# Patient Record
Sex: Male | Born: 1955 | Race: White | Hispanic: No | Marital: Married | Smoking: Former smoker
Health system: Southern US, Community
[De-identification: ages and names within clinical notes are randomized; demographics above are authoritative.]

## PROBLEM LIST (undated history)

## (undated) DIAGNOSIS — L409 Psoriasis, unspecified: Secondary | ICD-10-CM

## (undated) DIAGNOSIS — C801 Malignant (primary) neoplasm, unspecified: Secondary | ICD-10-CM

## (undated) DIAGNOSIS — N189 Chronic kidney disease, unspecified: Secondary | ICD-10-CM

## (undated) DIAGNOSIS — C649 Malignant neoplasm of unspecified kidney, except renal pelvis: Secondary | ICD-10-CM

## (undated) DIAGNOSIS — T8859XA Other complications of anesthesia, initial encounter: Secondary | ICD-10-CM

## (undated) DIAGNOSIS — E119 Type 2 diabetes mellitus without complications: Secondary | ICD-10-CM

## (undated) DIAGNOSIS — I10 Essential (primary) hypertension: Secondary | ICD-10-CM

## (undated) DIAGNOSIS — G4730 Sleep apnea, unspecified: Secondary | ICD-10-CM

## (undated) DIAGNOSIS — R21 Rash and other nonspecific skin eruption: Secondary | ICD-10-CM

## (undated) DIAGNOSIS — M1A9XX0 Chronic gout, unspecified, without tophus (tophi): Secondary | ICD-10-CM

## (undated) DIAGNOSIS — L719 Rosacea, unspecified: Secondary | ICD-10-CM

## (undated) DIAGNOSIS — M1990 Unspecified osteoarthritis, unspecified site: Secondary | ICD-10-CM

## (undated) DIAGNOSIS — E785 Hyperlipidemia, unspecified: Secondary | ICD-10-CM

## (undated) DIAGNOSIS — Z8739 Personal history of other diseases of the musculoskeletal system and connective tissue: Secondary | ICD-10-CM

## (undated) DIAGNOSIS — H539 Unspecified visual disturbance: Secondary | ICD-10-CM

## (undated) HISTORY — DX: Unspecified visual disturbance: H53.9

## (undated) HISTORY — PX: LITHOTRIPSY: SUR834

## (undated) HISTORY — DX: Essential (primary) hypertension: I10

## (undated) HISTORY — DX: Type 2 diabetes mellitus without complications: E11.9

## (undated) HISTORY — DX: Unspecified osteoarthritis, unspecified site: M19.90

## (undated) HISTORY — DX: Sleep apnea, unspecified: G47.30

## (undated) HISTORY — DX: Other complications of anesthesia, initial encounter: T88.59XA

## (undated) HISTORY — DX: Rosacea, unspecified: L71.9

## (undated) HISTORY — DX: Hyperlipidemia, unspecified: E78.5

## (undated) HISTORY — DX: Chronic gout, unspecified, without tophus (tophi): M1A.9XX0

## (undated) HISTORY — DX: Personal history of other diseases of the musculoskeletal system and connective tissue: Z87.39

## (undated) HISTORY — PX: ACHILLES TENDON SURGERY: SHX542

## (undated) HISTORY — DX: Rash and other nonspecific skin eruption: R21

## (undated) HISTORY — DX: Malignant neoplasm of unspecified kidney, except renal pelvis: C64.9

## (undated) HISTORY — PX: REPLACEMENT TOTAL KNEE BILATERAL: SUR1225

## (undated) HISTORY — DX: Chronic kidney disease, unspecified: N18.9

## (undated) HISTORY — DX: Psoriasis, unspecified: L40.9

## (undated) HISTORY — DX: Malignant (primary) neoplasm, unspecified: C80.1

---

## 1991-06-08 HISTORY — PX: OTHER SURGICAL HISTORY: SHX169

## 1996-12-14 ENCOUNTER — Emergency Department: Admit: 1996-12-14 | Payer: Self-pay | Admitting: Internal Medicine

## 2005-03-09 DIAGNOSIS — M7660 Achilles tendinitis, unspecified leg: Secondary | ICD-10-CM

## 2005-03-09 HISTORY — DX: Achilles tendinitis, unspecified leg: M76.60

## 2007-11-08 ENCOUNTER — Emergency Department: Admit: 2007-11-08 | Payer: Self-pay | Source: Emergency Department | Admitting: Emergency Medicine

## 2007-11-08 LAB — BASIC METABOLIC PANEL
BUN: 13 MG/DL (ref 7–21)
CO2: 29 MEQ/L (ref 22–31)
Calcium: 9.2 MG/DL (ref 8.6–10.2)
Chloride: 105 MEQ/L (ref 98–107)
Creatinine: 0.8 mg/dL (ref 0.6–1.5)
Glucose: 192 MG/DL — ABNORMAL HIGH (ref 65–110)
Potassium: 3.9 MEQ/L (ref 3.6–5.0)
Sodium: 141 MEQ/L (ref 136–143)

## 2007-11-08 LAB — CBC AND DIFFERENTIAL
Basophils Absolute: 0.1 /mm3 (ref 0.0–0.2)
Basophils: 1 % (ref 0–2)
Eosinophils Absolute: 0.2 /mm3 (ref 0.0–0.7)
Eosinophils: 2 % (ref 0–5)
Granulocytes Absolute: 5.5 /mm3 (ref 1.8–8.1)
Hematocrit: 42.6 % (ref 42.0–52.0)
Hgb: 14.9 G/DL (ref 13.0–17.0)
Lymphocytes Absolute: 1.5 /mm3 (ref 0.5–4.4)
Lymphocytes: 20 % (ref 15–41)
MCH: 28.5 PG (ref 28.0–32.0)
MCHC: 35 G/DL (ref 32.0–36.0)
MCV: 81.6 FL (ref 80.0–100.0)
MPV: 9.4 FL (ref 9.4–12.3)
Monocytes Absolute: 0.4 /mm3 (ref 0.0–1.2)
Monocytes: 5 % (ref 0–11)
Neutrophils %: 72 % (ref 52–75)
Platelets: 185 /mm3 (ref 140–400)
RBC: 5.22 /mm3 (ref 4.70–6.00)
RDW: 14.2 % (ref 11.5–15.0)
WBC: 7.66 /mm3 (ref 3.50–10.80)

## 2008-01-08 HISTORY — PX: KNEE ARTHROSCOPY: SUR90

## 2008-01-24 ENCOUNTER — Ambulatory Visit
Admit: 2008-01-24 | Disposition: A | Discharge status: Home / Self Care | Payer: Self-pay | Source: Ambulatory Visit | Admitting: Orthopaedic Surgery

## 2008-01-24 LAB — DIFFERENTIAL AUTOMATED
Basophils Absolute: 0.1 /mm3 (ref 0.0–0.2)
Basophils: 1 % (ref 0–2)
Eosinophils Absolute: 0.2 /mm3 (ref 0.0–0.7)
Eosinophils: 3 % (ref 0–5)
Granulocytes Absolute: 4.5 /mm3 (ref 1.8–8.1)
Lymphocytes Absolute: 1.6 /mm3 (ref 0.5–4.4)
Lymphocytes: 24 % (ref 15–41)
Monocytes Absolute: 0.4 /mm3 (ref 0.0–1.2)
Monocytes: 5 % (ref 0–11)
Neutrophils %: 67 % (ref 52–75)

## 2008-01-24 LAB — CBC
Hematocrit: 42.9 % (ref 42.0–52.0)
Hgb: 14.9 G/DL (ref 13.0–17.0)
MCH: 28.8 PG (ref 28.0–32.0)
MCHC: 34.7 G/DL (ref 32.0–36.0)
MCV: 83 FL (ref 80.0–100.0)
MPV: 10.4 FL (ref 9.4–12.3)
Platelets: 179 /mm3 (ref 140–400)
RBC: 5.17 /mm3 (ref 4.70–6.00)
RDW: 14.5 % (ref 11.5–15.0)
WBC: 6.71 /mm3 (ref 3.50–10.80)

## 2008-01-24 LAB — BASIC METABOLIC PANEL
BUN: 19 MG/DL (ref 7–21)
CO2: 30 MEQ/L (ref 22–31)
Calcium: 9.2 MG/DL (ref 8.6–10.2)
Chloride: 105 MEQ/L (ref 98–107)
Creatinine: 1 mg/dL (ref 0.6–1.5)
Glucose: 110 MG/DL (ref 65–110)
Potassium: 4.6 MEQ/L (ref 3.6–5.0)
Sodium: 142 MEQ/L (ref 136–143)

## 2008-02-01 ENCOUNTER — Emergency Department: Admit: 2008-02-01 | Payer: Self-pay | Source: Emergency Department | Admitting: Emergency Medicine

## 2008-02-01 LAB — TROPONIN I QUANTITATIVE LEVEL - IFOH CERNER: Troponin I Quant: 0.02 NG/ML

## 2008-02-01 LAB — CBC AND DIFFERENTIAL
Basophils Absolute: 0 /mm3 (ref 0.0–0.2)
Basophils: 0 % (ref 0–2)
Eosinophils Absolute: 0.1 /mm3 (ref 0.0–0.7)
Eosinophils: 1 % (ref 0–5)
Granulocytes Absolute: 7.7 /mm3 (ref 1.8–8.1)
Hematocrit: 41.8 % — ABNORMAL LOW (ref 42.0–52.0)
Hgb: 13.7 G/DL (ref 13.0–17.0)
Immature Granulocytes Absolute: 0 CUMM (ref 0.0–0.0)
Immature Granulocytes: 0 % (ref 0–1)
Lymphocytes Absolute: 0.8 /mm3 (ref 0.5–4.4)
Lymphocytes: 9 % — ABNORMAL LOW (ref 15–41)
MCH: 27.9 PG — ABNORMAL LOW (ref 28.0–32.0)
MCHC: 32.8 G/DL (ref 32.0–36.0)
MCV: 85.1 FL (ref 80.0–100.0)
MPV: 10 FL (ref 9.4–12.3)
Monocytes Absolute: 0.2 /mm3 (ref 0.0–1.2)
Monocytes: 2 % (ref 0–11)
Neutrophils %: 88 % — ABNORMAL HIGH (ref 52–75)
Platelets: 148 /mm3 (ref 140–400)
RBC: 4.91 /mm3 (ref 4.70–6.00)
RDW: 14.1 % (ref 11.5–15.0)
WBC: 8.82 /mm3 (ref 3.50–10.80)

## 2008-02-01 LAB — BASIC METABOLIC PANEL
BUN: 14 MG/DL (ref 7–21)
CO2: 27 MEQ/L (ref 22–31)
Calcium: 8.9 MG/DL (ref 8.6–10.2)
Chloride: 104 MEQ/L (ref 98–107)
Creatinine: 0.8 MG/DL (ref 0.5–1.4)
Glucose: 132 MG/DL — ABNORMAL HIGH (ref 65–110)
Potassium: 4.5 MEQ/L (ref 3.6–5.0)
Sodium: 140 MEQ/L (ref 136–143)

## 2008-02-01 LAB — CK: Creatine Kinase (CK): 81 U/L (ref 20–297)

## 2008-02-01 LAB — GFR

## 2008-02-13 DIAGNOSIS — I10 Essential (primary) hypertension: Secondary | ICD-10-CM | POA: Insufficient documentation

## 2008-02-13 DIAGNOSIS — R9431 Abnormal electrocardiogram [ECG] [EKG]: Secondary | ICD-10-CM | POA: Insufficient documentation

## 2008-02-14 DIAGNOSIS — I209 Angina pectoris, unspecified: Secondary | ICD-10-CM | POA: Insufficient documentation

## 2010-04-09 DIAGNOSIS — N200 Calculus of kidney: Secondary | ICD-10-CM | POA: Insufficient documentation

## 2010-04-12 ENCOUNTER — Emergency Department: Admit: 2010-04-12 | Payer: Self-pay | Source: Emergency Department | Admitting: Emergency Medicine

## 2010-04-12 LAB — COMPREHENSIVE METABOLIC PANEL
ALT: 37 U/L — ABNORMAL HIGH (ref 4–36)
AST (SGOT): 20 U/L (ref 10–41)
Albumin/Globulin Ratio: 1.3 (ref 1.1–1.8)
Albumin: 3.5 g/dL (ref 3.4–4.9)
Alkaline Phosphatase: 112 U/L (ref 43–112)
BUN: 18 mg/dL (ref 7–21)
Bilirubin, Total: 0.6 mg/dL (ref 0.2–1.0)
CO2: 27 mEq/L (ref 22–31)
Calcium: 8.7 mg/dL (ref 8.6–10.2)
Chloride: 102 mEq/L (ref 98–107)
Creatinine: 0.8 mg/dL (ref 0.5–1.4)
Globulin: 2.6 g/dL (ref 2.0–3.7)
Glucose: 146 mg/dL — ABNORMAL HIGH (ref 70–100)
Potassium: 4 mEq/L (ref 3.6–5.0)
Protein, Total: 6.1 g/dL (ref 6.0–8.0)
Sodium: 139 mEq/L (ref 136–143)

## 2010-04-12 LAB — CBC AND DIFFERENTIAL
Baso(Absolute): 0.04 10*3/uL (ref 0.00–0.20)
Basophils: 1 % (ref 0–2)
Eosinophils Absolute: 0.14 10*3/uL (ref 0.00–0.70)
Eosinophils: 2 % (ref 0–5)
Hematocrit: 38.7 % — ABNORMAL LOW (ref 42.0–52.0)
Hgb: 13.3 g/dL (ref 13.0–17.0)
Lymphocytes Absolute: 1.56 10*3/uL (ref 0.50–4.40)
Lymphocytes: 26 % (ref 15–41)
MCH: 29.1 pg (ref 28.0–32.0)
MCHC: 34.4 g/dL (ref 32.0–36.0)
MCV: 84.7 fL (ref 80.0–100.0)
MPV: 9.9 fL (ref 9.4–12.3)
Monocytes Absolute: 0.35 10*3/uL (ref 0.00–1.20)
Monocytes: 6 % (ref 0–11)
Neutrophils Absolute: 3.98 10*3/uL
Neutrophils: 66 % (ref 52–75)
Platelets: 189 10*3/uL (ref 140–400)
RBC: 4.57 10*6/uL — ABNORMAL LOW (ref 4.70–6.00)
RDW: 14 % (ref 12–15)
WBC: 6.07 10*3/uL (ref 3.50–10.80)

## 2010-04-12 LAB — GFR: EGFR: >60

## 2010-04-13 LAB — SEDIMENTATION RATE: Sed Rate: 16 mm/Hr — ABNORMAL HIGH (ref 0–12)

## 2010-04-30 ENCOUNTER — Ambulatory Visit: Payer: Self-pay

## 2010-04-30 ENCOUNTER — Inpatient Hospital Stay
Admission: RE | Admit: 2010-04-30 | Disposition: A | Discharge status: Home / Self Care | Payer: Self-pay | Source: Ambulatory Visit | Admitting: Urology

## 2010-04-30 ENCOUNTER — Emergency Department: Admit: 2010-04-30 | Payer: Self-pay | Source: Emergency Department | Admitting: Emergency Medicine

## 2010-04-30 LAB — BASIC METABOLIC PANEL
BUN: 20 mg/dL (ref 7–21)
CO2: 24 mEq/L (ref 22–31)
Calcium: 9 mg/dL (ref 8.6–10.2)
Chloride: 103 mEq/L (ref 98–107)
Creatinine: 0.7 mg/dL (ref 0.5–1.4)
Glucose: 173 mg/dL — ABNORMAL HIGH (ref 70–100)
Potassium: 4.6 mEq/L (ref 3.6–5.0)
Sodium: 138 mEq/L (ref 136–143)

## 2010-04-30 LAB — URINALYSIS, REFLEX TO MICROSCOPIC EXAM IF INDICATED
Bilirubin, UA: NEGATIVE
Glucose, UA: NEGATIVE
Ketones UA: NEGATIVE
Leukocyte Esterase, UA: NEGATIVE
Nitrite, UA: NEGATIVE
Protein, UR: 100 — AB
Specific Gravity UA POCT: >=1.03 (ref 1.001–1.035)
Urine pH: 5.5 (ref 5.0–8.0)
Urobilinogen, UA: 0.2 mg/dL

## 2010-04-30 LAB — PT AND APTT
PT INR: 1.1 (ref 0.9–1.1)
PT: 13.2 s (ref 10.8–13.3)
PTT: 24 s (ref 21–32)

## 2010-04-30 LAB — CBC AND DIFFERENTIAL
Basophils Absolute Automated: 0.04 10*3/uL (ref 0.00–0.20)
Basophils Automated: 0 % (ref 0–2)
Eosinophils Absolute Automated: 0.03 10*3/uL (ref 0.00–0.70)
Eosinophils Automated: 0 % (ref 0–5)
Hematocrit: 42.4 % (ref 42.0–52.0)
Hgb: 14.9 g/dL (ref 13.0–17.0)
Lymphocytes Absolute Automated: 0.97 10*3/uL (ref 0.50–4.40)
Lymphocytes Automated: 10 % — ABNORMAL LOW (ref 15–41)
MCH: 28.6 pg (ref 28.0–32.0)
MCHC: 35.1 g/dL (ref 32.0–36.0)
MCV: 81.4 fL (ref 80.0–100.0)
MPV: 9.5 fL (ref 9.4–12.3)
Monocytes Absolute Automated: 0.38 10*3/uL (ref 0.00–1.20)
Monocytes: 4 % (ref 0–11)
Neutrophils Absolute: 8.36 10*3/uL — ABNORMAL HIGH (ref 1.80–8.10)
Neutrophils: 86 % — ABNORMAL HIGH (ref 52–75)
Platelets: 207 10*3/uL (ref 140–400)
RBC: 5.21 10*6/uL (ref 4.70–6.00)
RDW: 14 % (ref 12–15)
WBC: 9.78 10*3/uL (ref 3.50–10.80)

## 2010-04-30 LAB — GFR: EGFR: >60

## 2010-05-01 LAB — BASIC METABOLIC PANEL
Anion Gap: 10 (ref 5.0–15.0)
BUN: 15 mg/dL (ref 9.0–21.0)
CO2: 21 mEq/L — ABNORMAL LOW (ref 22–29)
Calcium: 8.4 mg/dL — ABNORMAL LOW (ref 8.5–10.5)
Chloride: 110 mEq/L — ABNORMAL HIGH (ref 98–107)
Creatinine: 0.8 mg/dL (ref 0.7–1.3)
Glucose: 146 mg/dL — ABNORMAL HIGH (ref 70–100)
Potassium: 3.9 mEq/L (ref 3.5–5.1)
Sodium: 141 mEq/L (ref 136–145)

## 2010-05-01 LAB — CBC AND DIFFERENTIAL
Basophils Absolute Automated: 0.04 10*3/uL (ref 0.00–0.20)
Basophils Automated: 1 % (ref 0–2)
Eosinophils Absolute Automated: 0.15 10*3/uL (ref 0.00–0.70)
Eosinophils Automated: 2 % (ref 0–5)
Hematocrit: 38.8 % — ABNORMAL LOW (ref 42.0–52.0)
Hgb: 12.6 g/dL — ABNORMAL LOW (ref 13.0–17.0)
Immature Granulocytes Absolute: 0.01 10*3/uL
Immature Granulocytes: 0 % (ref 0–1)
Lymphocytes Absolute Automated: 1.31 10*3/uL (ref 0.50–4.40)
Lymphocytes Automated: 21 % (ref 15–41)
MCH: 27.6 pg — ABNORMAL LOW (ref 28.0–32.0)
MCHC: 32.5 g/dL (ref 32.0–36.0)
MCV: 85.1 fL (ref 80.0–100.0)
MPV: 10.1 fL (ref 9.4–12.3)
Monocytes Absolute Automated: 0.36 10*3/uL (ref 0.00–1.20)
Monocytes: 6 % (ref 0–11)
Neutrophils Absolute: 4.44 10*3/uL (ref 1.80–8.10)
Neutrophils: 70 % (ref 52–75)
Nucleated RBC: 0 /100 WBC
Platelets: 135 10*3/uL — ABNORMAL LOW (ref 140–400)
RBC: 4.56 10*6/uL — ABNORMAL LOW (ref 4.70–6.00)
RDW: 14 % (ref 12–15)
WBC: 6.3 10*3/uL (ref 3.50–10.80)

## 2010-05-01 LAB — GFR: EGFR: >60

## 2010-05-01 LAB — HEMOLYSIS INDEX: Hemolysis Index: 16 Index (ref 0–18)

## 2010-05-02 ENCOUNTER — Emergency Department: Admit: 2010-05-02 | Payer: Self-pay | Source: Emergency Department | Admitting: Emergency Medicine

## 2010-05-02 LAB — CBC AND DIFFERENTIAL
Basophils Absolute Automated: 0.03 10*3/uL (ref 0.00–0.20)
Basophils Automated: 0 % (ref 0–2)
Eosinophils Absolute Automated: 0.13 10*3/uL (ref 0.00–0.70)
Eosinophils Automated: 2 % (ref 0–5)
Hematocrit: 39.8 % — ABNORMAL LOW (ref 42.0–52.0)
Hgb: 13.3 g/dL (ref 13.0–17.0)
Immature Granulocytes Absolute: 0.05 10*3/uL
Immature Granulocytes: 1 % (ref 0–1)
Lymphocytes Absolute Automated: 0.83 10*3/uL (ref 0.50–4.40)
Lymphocytes Automated: 10 % — ABNORMAL LOW (ref 15–41)
MCH: 28.2 pg (ref 28.0–32.0)
MCHC: 33.4 g/dL (ref 32.0–36.0)
MCV: 84.5 fL (ref 80.0–100.0)
MPV: 9.8 fL (ref 9.4–12.3)
Monocytes Absolute Automated: 0.53 10*3/uL (ref 0.00–1.20)
Monocytes: 6 % (ref 0–11)
Neutrophils Absolute: 6.94 10*3/uL (ref 1.80–8.10)
Neutrophils: 82 % — ABNORMAL HIGH (ref 52–75)
Nucleated RBC: 0 /100 WBC
Platelets: 151 10*3/uL (ref 140–400)
RBC: 4.71 10*6/uL (ref 4.70–6.00)
RDW: 14 % (ref 12–15)
WBC: 8.46 10*3/uL (ref 3.50–10.80)

## 2010-05-02 LAB — BASIC METABOLIC PANEL
Anion Gap: 11 (ref 5.0–15.0)
BUN: 16 mg/dL (ref 9.0–21.0)
CO2: 21 mEq/L — ABNORMAL LOW (ref 22–29)
Calcium: 8.4 mg/dL — ABNORMAL LOW (ref 8.5–10.5)
Chloride: 107 mEq/L (ref 98–107)
Creatinine: 0.9 mg/dL (ref 0.7–1.3)
Glucose: 166 mg/dL — ABNORMAL HIGH (ref 70–100)
Potassium: 4.1 mEq/L (ref 3.5–5.1)
Sodium: 139 mEq/L (ref 136–145)

## 2010-05-02 LAB — URINALYSIS, REFLEX TO MICROSCOPIC EXAM IF INDICATED
Bilirubin, UA: NEGATIVE
Glucose, UA: NEGATIVE
Ketones UA: NEGATIVE
Nitrite, UA: NEGATIVE
Protein, UR: 30 — AB
RBC, UA: 73 /HPF (ref 0–5)
Specific Gravity UA POCT: 1.015 (ref 1.001–1.035)
Urine pH: 6 (ref 5.0–8.0)
Urobilinogen, UA: NORMAL mg/dL
WBC, UA: 7 /HPF (ref 0–5)

## 2010-05-02 LAB — GFR: EGFR: >60

## 2010-05-02 LAB — HEMOLYSIS INDEX: Hemolysis Index: 8 Index (ref 0–18)

## 2010-05-02 LAB — LAB USE ONLY - HISTORICAL SURGICAL PATHOLOGY

## 2010-05-08 DIAGNOSIS — N200 Calculus of kidney: Secondary | ICD-10-CM

## 2010-05-08 HISTORY — DX: Calculus of kidney: N20.0

## 2010-08-18 ENCOUNTER — Encounter (INDEPENDENT_AMBULATORY_CARE_PROVIDER_SITE_OTHER): Payer: Self-pay | Admitting: Family Medicine

## 2010-08-19 ENCOUNTER — Ambulatory Visit (INDEPENDENT_AMBULATORY_CARE_PROVIDER_SITE_OTHER): Admitting: Family Medicine

## 2010-08-19 ENCOUNTER — Encounter (INDEPENDENT_AMBULATORY_CARE_PROVIDER_SITE_OTHER): Payer: Self-pay | Admitting: Family Medicine

## 2010-08-19 VITALS — BP 155/91 | HR 78 | Temp 98.3°F | Ht 75.0 in | Wt 320.4 lb

## 2010-08-19 DIAGNOSIS — N200 Calculus of kidney: Secondary | ICD-10-CM

## 2010-08-19 DIAGNOSIS — I10 Essential (primary) hypertension: Secondary | ICD-10-CM

## 2010-08-19 MED ORDER — LISINOPRIL-HYDROCHLOROTHIAZIDE 20-25 MG PO TABS
1.00 | ORAL_TABLET | Freq: Every day | ORAL | Status: DC
Start: 2010-08-19 — End: 2010-10-14

## 2010-08-19 NOTE — Progress Notes (Signed)
Subjective:       Patient ID: Francis Trevino is a 55 y.o. male.    Hypertension  This is a chronic problem. The problem is unchanged. The problem is uncontrolled. Associated symptoms include peripheral edema. Pertinent negatives include no blurred vision, chest pain, headaches, orthopnea, palpitations, PND or shortness of breath. There are no associated agents to hypertension. Risk factors for coronary artery disease include male gender, obesity and sedentary lifestyle. Past treatments include ACE inhibitors. The current treatment provides no improvement. Compliance problems include diet and exercise.      Has been on another BP medication in the past, in addition to the Lisinopril but was taken off it since had hypotension immediately after knee arthroscopy. Could not recall the name. Denies any cough with Lisinopril. Has not been checking his BP at home much. SBP 140-153, DBP 90-100s.    Since last seen in Feb 2012 by Francis Trevino, had kidney stones and underwent lithotripsy. Was seeing Urology near [facility] but wants referral to another Urologist. No abdominal or flank pain, urinary disturbances, hematuria. Stones were made of calcium.    The following portions of the patient's history were reviewed and updated as appropriate: He  has a past medical history of Pain in Achilles tendon (2007); Kidney calculi (05/2010); and Hypertension.  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  His family history includes Alzheimer's disease in his mother; Diabetes in his father; and Heart disease in his father.  There is no history of Hypertension and Hyperlipidemia.  He  reports that he quit smoking about 14 years ago. His smoking use included Cigarettes. He has a 30 pack-year smoking history. He does not have any smokeless tobacco history on file. He reports that he drinks alcohol. His drug history not on file.  He has a current medication list which includes the following  prescription(s): lisinopril, naproxen.  He is allergic to sulfa antibiotics.    Review of Systems   Constitutional: Negative for diaphoresis and unexpected weight change.   Eyes: Negative for blurred vision and visual disturbance.   Respiratory: Negative for chest tightness and shortness of breath.    Cardiovascular: Positive for leg swelling. Negative for chest pain, palpitations, orthopnea and PND.   Neurological: Negative for headaches.         Objective:    Physical Exam   Vitals reviewed.  Constitutional: He appears well-developed and well-nourished. No distress.   Cardiovascular: Normal rate, regular rhythm, normal heart sounds and intact distal pulses.    No murmur heard.  Pulmonary/Chest: Effort normal and breath sounds normal. No respiratory distress. He has no wheezes. He has no rales. He exhibits no tenderness.   Neurological: He is alert.   Skin: He is not diaphoretic.   Psychiatric: He has a normal mood and affect. His behavior is normal. Judgment and thought content normal.     BP 162/94 155/91 Pulse 78 Resp Temp 98.3 F (36.8 C) Temp src Oral Weight 320 lb 6.4 oz (145.332 kg) Height 6\' 3"  (1.905 m)         Assessment:       1. HTN (hypertension)  lisinopril-hydrochlorothiazide (PRINZIDE) 20-25 MG per tablet   2. Calcium nephrolithiasis  Ambulatory referral to Urology           Plan:       HTN, essential - Uncontrolled. BP in-office 162/94, repeat 155/91. Switch Lisinopril 20mg  to Prinzide 20/25mg  daily (to be taken at night).  Given handout on DASH diet. Ffup in 1 month for HTN check.    Calcium nephrolithiasis - Referred to [facility].

## 2010-10-13 ENCOUNTER — Emergency Department
Admit: 2010-10-13 | Discharge: 2010-10-13 | Disposition: A | Discharge status: Home / Self Care | Payer: Self-pay | Source: Emergency Department | Admitting: Emergency Medicine

## 2010-10-14 ENCOUNTER — Ambulatory Visit (INDEPENDENT_AMBULATORY_CARE_PROVIDER_SITE_OTHER): Admitting: Family Medicine

## 2010-10-14 ENCOUNTER — Encounter (INDEPENDENT_AMBULATORY_CARE_PROVIDER_SITE_OTHER): Payer: Self-pay | Admitting: Family Medicine

## 2010-10-14 VITALS — BP 128/83 | HR 80 | Temp 98.7°F | Ht 75.0 in | Wt 316.4 lb

## 2010-10-14 DIAGNOSIS — Z8739 Personal history of other diseases of the musculoskeletal system and connective tissue: Secondary | ICD-10-CM | POA: Insufficient documentation

## 2010-10-14 DIAGNOSIS — M2550 Pain in unspecified joint: Secondary | ICD-10-CM | POA: Insufficient documentation

## 2010-10-14 DIAGNOSIS — N200 Calculus of kidney: Secondary | ICD-10-CM

## 2010-10-14 DIAGNOSIS — Z8639 Personal history of other endocrine, nutritional and metabolic disease: Secondary | ICD-10-CM

## 2010-10-14 DIAGNOSIS — I10 Essential (primary) hypertension: Secondary | ICD-10-CM

## 2010-10-14 DIAGNOSIS — M25569 Pain in unspecified knee: Secondary | ICD-10-CM

## 2010-10-14 DIAGNOSIS — Z862 Personal history of diseases of the blood and blood-forming organs and certain disorders involving the immune mechanism: Secondary | ICD-10-CM

## 2010-10-14 DIAGNOSIS — M25562 Pain in left knee: Secondary | ICD-10-CM

## 2010-10-14 MED ORDER — LISINOPRIL-HYDROCHLOROTHIAZIDE 20-25 MG PO TABS
1.00 | ORAL_TABLET | Freq: Every day | ORAL | Status: AC
Start: 2010-10-14 — End: 2011-10-14

## 2010-10-14 MED ORDER — DICLOFENAC SODIUM 1 % TD GEL
2.00 g | Freq: Four times a day (QID) | TRANSDERMAL | Status: DC
Start: 2010-10-14 — End: 2016-02-11

## 2010-10-14 NOTE — Progress Notes (Signed)
Subjective:       Patient ID: Francis Trevino is a 55 y.o. male.  Here for HTN follow-up, and refills of Voltaren gel for his joint pains.    HPI  Hypertension  This is a chronic problem. The problem is unchanged. The problem is controlled. Associated symptoms include peripheral edema. Pertinent negatives include no blurred vision, chest pain, headaches, orthopnea, palpitations, PND or shortness of breath. There are no associated agents to hypertension. Risk factors for coronary artery disease include male gender, obesity and sedentary lifestyle. Past treatments include ACE inhibitors. This was switched to Lisinopril-HCTZ 20-25mg  daily a month ago. BP readings better at home. Compliance problems include diet and exercise.      Using Voltaren gel as prescribed by his Podiatrist for his right foot pain. Had a surgery involving the Achilles tendon a few months back. Occasionally he would have some pain also on both his knees, which his Orthopedic confirmed to be arthritis. Symptoms would resolve. Has been using Naproxen 500mg  BID prn.    Since last seen in Feb 2012 by Dr. Wyvonnia Lora, had kidney stones and underwent lithotripsy. Pt did not hear back from his Urologist. No abdominal or flank pain, urinary disturbances, hematuria. Stones were made of calcium.    Has a history of gout. Having flares 1-2x/6 months. Has not been on prophylactic treatments. No hyperuricemia in the past. Usually triggered when he eats shrimps, and resolved with Naproxen.    The following portions of the patient's history were reviewed and updated as appropriate: He  has a past medical history of Pain in Achilles tendon (2007); Kidney calculi (05/2010); Hypertension; History of gout; and Calcium oxalate kidney stones (05/2010).  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  His family history includes Alzheimer's disease in his mother; Diabetes in his father; and Heart  disease in his father.  There is no history of Hypertension and Hyperlipidemia.  He  reports that he quit smoking about 14 years ago. His smoking use included Cigarettes. He has a 30 pack-year smoking history. He does not have any smokeless tobacco history on file. He reports that he drinks alcohol. His drug history not on file.  He has a current medication list which includes the following prescription(s): diclofenac sodium, lisinopril-hydrochlorothiazide, naproxen, and lisinopril.  He is allergic to sulfa antibiotics..    Review of Systems   Eyes: Negative for visual disturbance.   Respiratory: Negative for chest tightness and shortness of breath.    Cardiovascular: Negative for chest pain, palpitations and leg swelling.         Objective:    Physical Exam   Vitals reviewed.  Constitutional: He appears well-developed and well-nourished. No distress.   Cardiovascular: Normal rate, regular rhythm, normal heart sounds and intact distal pulses.    No murmur heard.  Pulmonary/Chest: Effort normal and breath sounds normal. No respiratory distress. He has no wheezes. He has no rales. He exhibits no tenderness.   Neurological: He is alert.   Skin: Skin is warm and dry. He is not diaphoretic. No pallor.   Psychiatric: He has a normal mood and affect. His behavior is normal. Judgment and thought content normal.     BP 128/83  Pulse 80  Temp(Src) 98.7 F (37.1 C) (Oral)  Ht 6\' 3"  (1.905 m)  Wt 316 lb 6.4 oz (143.518 kg)  BMI 39.55 kg/m2      Assessment:  1. Hypertension  lisinopril-hydrochlorothiazide (PRINZIDE) 20-25 MG per tablet   2. Calcium nephrolithiasis     3. Bilateral knee pain  Diclofenac Sodium (VOLTAREN) 1 % GEL   4. Multiple joint pain           Plan:       HTN, essential - Controlled. Continue Lisinopril-HCTZ 20-25mg  daily. Given #90, 3 refills. Weight loss.    Calcium nephrolithiasis - s/p lithotripsy. Asymptomatic. Counseled pt on low calcium and oxalate diet.     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 Limit use of NSAIDs due to his HTN. Voltaren gel prn.    History of gout - Limit protein intake. Avoid known triggers. If becomes more frequent, needs to check serum uric acid levels and consider prophylactic treatment.    RTC 6 months for BP check.

## 2010-10-15 ENCOUNTER — Other Ambulatory Visit (INDEPENDENT_AMBULATORY_CARE_PROVIDER_SITE_OTHER): Payer: Self-pay

## 2010-10-15 ENCOUNTER — Encounter (INDEPENDENT_AMBULATORY_CARE_PROVIDER_SITE_OTHER): Payer: Self-pay | Admitting: Family Medicine

## 2010-10-17 ENCOUNTER — Other Ambulatory Visit (INDEPENDENT_AMBULATORY_CARE_PROVIDER_SITE_OTHER): Payer: Self-pay

## 2010-11-17 LAB — ECG 12-LEAD
Atrial Rate: 83 {beats}/min
P Axis: 31 degrees
P-R Interval: 164 ms
Q-T Interval: 380 ms
QRS Duration: 98 ms
QTC Calculation (Bezet): 446 ms
R Axis: -44 degrees
T Axis: 76 degrees
Ventricular Rate: 83 {beats}/min

## 2010-11-18 LAB — ECG 12-LEAD
Atrial Rate: 61 {beats}/min
P Axis: 16 degrees
P-R Interval: 166 ms
Q-T Interval: 420 ms
QRS Duration: 100 ms
QTC Calculation (Bezet): 422 ms
R Axis: -27 degrees
T Axis: 84 degrees
Ventricular Rate: 61 {beats}/min

## 2010-11-19 ENCOUNTER — Encounter (INDEPENDENT_AMBULATORY_CARE_PROVIDER_SITE_OTHER): Payer: Self-pay | Admitting: Family Medicine

## 2010-12-11 LAB — ECG 12-LEAD
Atrial Rate: 61 {beats}/min
Atrial Rate: 76 {beats}/min
P Axis: 25 degrees
P Axis: 28 degrees
P-R Interval: 146 ms
P-R Interval: 160 ms
Q-T Interval: 406 ms
Q-T Interval: 394 ms
QRS Duration: 98 ms
QRS Duration: 100 ms
QTC Calculation (Bezet): 408 ms
QTC Calculation (Bezet): 443 ms
R Axis: -28 degrees
R Axis: -33 degrees
T Axis: 125 degrees
T Axis: 126 degrees
Ventricular Rate: 61 {beats}/min
Ventricular Rate: 76 {beats}/min

## 2011-01-07 NOTE — H&P (Signed)
Francis Trevino, Francis Trevino      MRN:          [id number]      Account:      [id number]      Document ID:  [id number]                  Admit Date: 04/30/2010            Patient Location: A2615-01      Patient Type: I            ATTENDING PHYSICIAN: Arther Dames, MD                  ADMISSION DIAGNOSES:      1.  Left renal colic.      2.  Left renal calculi.      3.  Left hydronephrosis.            HISTORY OF PRESENT ILLNESS:      This is a 55 year old male who presented to the emergency room at the [facility] today with the complaint of acute onset of left flank pain.      Patient known to have a history of kidney stones.  A CAT scan obtained      revealed about a 2-cm stone in the renal pelvis with obstruction.  The      patient also noticed to have a 6-mm stone in the left kidney as well.  The      patient was admitted to [facility] for pain control and      further treatment of the obstructing stone.            PAST MEDICAL HISTORY:      Significant for history of hypertension.  History of gout.            PAST SURGICAL HISTORY:      1.  Patient is status post Achilles tendon surgery to the right ankle.      2.  Ankle release also from the right ankle.      3.  Status post left knee meniscus repair.            PSYCHIATRIC HISTORY:      No previous psychiatric history.            SOCIAL HISTORY:      The patient denies alcohol or drug abuse.            FAMILY HISTORY:      No family history of kidney stones, no family history of prostate cancer.            REVIEW OF SYSTEMS:      The patient denies headache or dizziness.  No chills, no fever.  No      shortage of breath.  However, he complained of left abdominal pain and back      pain radiating to the groin.  The patient denies any vision problem.  No      nose or throat problems.  The patient also denies any easy bruising or      lymph node enlargement.  No skin rashes.  The patient denies hematuria,                                   Page 1 of 2       Francis Trevino  MRN:          [id number]      Account:      [id number]      Document ID:  [id number]                  nocturia, or diarrhea.            PHYSICAL EXAMINATION:      VITAL SIGNS:  On admission, the patient had blood pressure of 176/106,      temperature was 98.6, heart rate 86.      HEAD:  Atraumatic, normocephalic.      CHEST:  Clear on auscultation anterior, posterior.      HEART:  Regular rate and rhythm, no murmurs appreciated.      ABDOMEN:  Left CVA tenderness.  Abdomen obese, no organomegaly.      GENITOURINARY:  Normal testicles and phallus.      RECTAL:  Digital rectal examination, normal size prostate.      EXTREMITIES:  No evidence of venous stasis or edema.      SKIN:  Warm, no skin rashes.      NEUROLOGIC:  Alert, oriented, no evidence of neurological deficit.            LABORATORY DATA:      On admission, white blood count 9.7.  The patient has BUN 20, creatinine      0.7.  Urinalysis revealed the presence of large red blood cells.  CAT scan      revealed the 8-mm stone in the right kidney with no obstruction.  There      were 2 stones in the left kidney; one is about 19 mm in the renal pelvis      with obstruction, hydronephrosis on the left side.  There was also a 6-mm      stone in the lower calix of the left kidney.            IMPRESSION:      1.  Left renal colic.      2.  Left hydronephrosis.      3.  Bilateral renal calculi.            PLAN:      We will admit patient for pain control, and further treatment of the      obstructing stone.                        Electronic Signing Provider            D:  04/30/2010 15:28 PM by Dr. Ann Held. Karel Jarvis, MD [phone])      T:  05/01/2010 07:28 AM by [id number]                  cc:                                   Page 2 of 2      Authenticated by Ann Held. Sanvika Cuttino, MD [phone]) On 05/01/2010 07:45:40 AM

## 2011-02-12 ENCOUNTER — Emergency Department
Admit: 2011-02-12 | Discharge: 2011-02-12 | Disposition: A | Discharge status: Home / Self Care | Payer: Self-pay | Source: Emergency Department | Admitting: Emergency Medicine

## 2011-05-15 ENCOUNTER — Emergency Department
Admit: 2011-05-15 | Discharge: 2011-05-15 | Disposition: A | Discharge status: Home / Self Care | Payer: Self-pay | Source: Emergency Department

## 2011-05-15 LAB — URINALYSIS, REFLEX TO MICROSCOPIC EXAM IF INDICATED
Bilirubin, UA: NEGATIVE
Glucose, UA: 100 — AB
Ketones UA: NEGATIVE
Leukocyte Esterase, UA: NEGATIVE
Nitrite, UA: NEGATIVE
Specific Gravity UA POCT: 1.016 (ref 1.001–1.035)
Urine pH: 5 (ref 5.0–8.0)
Urobilinogen, UA: NORMAL mg/dL

## 2011-05-15 LAB — BASIC METABOLIC PANEL
Anion Gap: 11 (ref 5.0–15.0)
BUN: 18 mg/dL (ref 7–21)
CO2: 25 mEq/L (ref 22–31)
Calcium: 8.8 mg/dL (ref 8.6–10.2)
Chloride: 102 mEq/L (ref 98–107)
Creatinine: 1 mg/dL (ref 0.5–1.4)
Glucose: 222 mg/dL — ABNORMAL HIGH (ref 70–100)
Potassium: 4.6 mEq/L (ref 3.6–5.0)
Sodium: 138 mEq/L (ref 136–143)

## 2011-05-15 LAB — CBC AND DIFFERENTIAL
Basophils Absolute Automated: 0.05 10*3/uL (ref 0.00–0.20)
Basophils Automated: 1 % (ref 0–2)
Eosinophils Absolute Automated: 0.18 10*3/uL (ref 0.00–0.70)
Eosinophils Automated: 3 % (ref 0–5)
Hematocrit: 40.8 % — ABNORMAL LOW (ref 42.0–52.0)
Hgb: 13.6 g/dL (ref 13.0–17.0)
Immature Granulocytes Absolute: 0.03 10*3/uL
Immature Granulocytes: 1 % (ref 0–1)
Lymphocytes Absolute Automated: 1.14 10*3/uL (ref 0.50–4.40)
Lymphocytes Automated: 18 % (ref 15–41)
MCH: 28.6 pg (ref 28.0–32.0)
MCHC: 33.3 g/dL (ref 32.0–36.0)
MCV: 85.9 fL (ref 80.0–100.0)
MPV: 9.8 fL (ref 9.4–12.3)
Monocytes Absolute Automated: 0.33 10*3/uL (ref 0.00–1.20)
Monocytes: 5 % (ref 0–11)
Neutrophils Absolute: 4.69 10*3/uL (ref 1.80–8.10)
Neutrophils: 73 % (ref 52–75)
Nucleated RBC: 0 /100 WBC
Platelets: 155 10*3/uL (ref 140–400)
RBC: 4.75 10*6/uL (ref 4.70–6.00)
RDW: 14 % (ref 12–15)
WBC: 6.39 10*3/uL (ref 3.50–10.80)

## 2011-05-15 LAB — GFR: EGFR: >60

## 2011-05-18 ENCOUNTER — Ambulatory Visit
Admit: 2011-05-18 | Discharge: 2011-05-18 | Disposition: A | Discharge status: Home / Self Care | Payer: Self-pay | Source: Ambulatory Visit

## 2011-05-18 ENCOUNTER — Ambulatory Visit (INDEPENDENT_AMBULATORY_CARE_PROVIDER_SITE_OTHER): Admitting: Family Medicine

## 2011-05-19 ENCOUNTER — Encounter (INDEPENDENT_AMBULATORY_CARE_PROVIDER_SITE_OTHER): Payer: Self-pay | Admitting: Family Medicine

## 2011-05-20 ENCOUNTER — Ambulatory Visit: Admission: RE | Admit: 2011-05-20 | Payer: Self-pay | Source: Ambulatory Visit | Attending: Urology | Admitting: Urology

## 2011-05-24 ENCOUNTER — Ambulatory Visit: Payer: Self-pay

## 2011-05-24 ENCOUNTER — Observation Stay
Admission: EM | Admit: 2011-05-24 | Disposition: A | Discharge status: Home / Self Care | Payer: Self-pay | Source: Emergency Department | Attending: Urology | Admitting: Urology

## 2011-05-24 LAB — CBC AND DIFFERENTIAL
Basophils Absolute Automated: 0.04 10*3/uL (ref 0.00–0.20)
Basophils Automated: 1 % (ref 0–2)
Eosinophils Absolute Automated: 0.23 10*3/uL (ref 0.00–0.70)
Eosinophils Automated: 3 % (ref 0–5)
Hematocrit: 39.1 % — ABNORMAL LOW (ref 42.0–52.0)
Hgb: 13.2 g/dL (ref 13.0–17.0)
Immature Granulocytes Absolute: 0.02 10*3/uL
Immature Granulocytes: 0 % (ref 0–1)
Lymphocytes Absolute Automated: 1.23 10*3/uL (ref 0.50–4.40)
Lymphocytes Automated: 17 % (ref 15–41)
MCH: 28.9 pg (ref 28.0–32.0)
MCHC: 33.8 g/dL (ref 32.0–36.0)
MCV: 85.6 fL (ref 80.0–100.0)
MPV: 9.6 fL (ref 9.4–12.3)
Monocytes Absolute Automated: 0.39 10*3/uL (ref 0.00–1.20)
Monocytes: 5 % (ref 0–11)
Neutrophils Absolute: 5.38 10*3/uL (ref 1.80–8.10)
Neutrophils: 74 % (ref 52–75)
Nucleated RBC: 0 /100 WBC
Platelets: 184 10*3/uL (ref 140–400)
RBC: 4.57 10*6/uL — ABNORMAL LOW (ref 4.70–6.00)
RDW: 14 % (ref 12–15)
WBC: 7.27 10*3/uL (ref 3.50–10.80)

## 2011-05-24 LAB — BASIC METABOLIC PANEL
Anion Gap: 10 (ref 5.0–15.0)
BUN: 29 mg/dL — ABNORMAL HIGH (ref 9.0–21.0)
CO2: 25 mEq/L (ref 22–29)
Calcium: 9.3 mg/dL (ref 8.5–10.5)
Chloride: 103 mEq/L (ref 98–107)
Creatinine: 1.9 mg/dL — ABNORMAL HIGH (ref 0.7–1.3)
Glucose: 187 mg/dL — ABNORMAL HIGH (ref 70–100)
Potassium: 4.7 mEq/L (ref 3.5–5.1)
Sodium: 138 mEq/L (ref 136–145)

## 2011-05-24 LAB — URINALYSIS, REFLEX TO MICROSCOPIC EXAM IF INDICATED
Bilirubin, UA: NEGATIVE
Glucose, UA: NEGATIVE
Ketones UA: NEGATIVE
Leukocyte Esterase, UA: NEGATIVE
Nitrite, UA: NEGATIVE
Protein, UR: 100 — AB
Specific Gravity UA POCT: 1.025 (ref 1.001–1.035)
Urine pH: 5 (ref 5.0–8.0)
Urobilinogen, UA: NEGATIVE mg/dL

## 2011-05-24 LAB — GFR: EGFR: 37.1

## 2011-05-24 LAB — HEMOLYSIS INDEX: Hemolysis Index: 4 Index (ref 0–18)

## 2011-05-25 LAB — BASIC METABOLIC PANEL
Anion Gap: 12 (ref 5.0–15.0)
BUN: 27 mg/dL — ABNORMAL HIGH (ref 9.0–21.0)
CO2: 21 mEq/L — ABNORMAL LOW (ref 22–29)
Calcium: 8.9 mg/dL (ref 8.5–10.5)
Chloride: 104 mEq/L (ref 98–107)
Creatinine: 1.6 mg/dL — ABNORMAL HIGH (ref 0.7–1.3)
Glucose: 282 mg/dL — ABNORMAL HIGH (ref 70–100)
Potassium: 4.8 mEq/L (ref 3.5–5.1)
Sodium: 137 mEq/L (ref 136–145)

## 2011-05-25 LAB — CBC AND DIFFERENTIAL
Basophils Absolute Automated: 0.01 10*3/uL (ref 0.00–0.20)
Basophils Automated: 0 % (ref 0–2)
Eosinophils Absolute Automated: 0.01 10*3/uL (ref 0.00–0.70)
Eosinophils Automated: 0 % (ref 0–5)
Hematocrit: 36.9 % — ABNORMAL LOW (ref 42.0–52.0)
Hgb: 12.1 g/dL — ABNORMAL LOW (ref 13.0–17.0)
Immature Granulocytes Absolute: 0.01 10*3/uL
Immature Granulocytes: 0 % (ref 0–1)
Lymphocytes Absolute Automated: 0.49 10*3/uL — ABNORMAL LOW (ref 0.50–4.40)
Lymphocytes Automated: 8 % — ABNORMAL LOW (ref 15–41)
MCH: 28.3 pg (ref 28.0–32.0)
MCHC: 32.8 g/dL (ref 32.0–36.0)
MCV: 86.2 fL (ref 80.0–100.0)
MPV: 9.9 fL (ref 9.4–12.3)
Monocytes Absolute Automated: 0.15 10*3/uL (ref 0.00–1.20)
Monocytes: 2 % (ref 0–11)
Neutrophils Absolute: 5.73 10*3/uL (ref 1.80–8.10)
Neutrophils: 90 % — ABNORMAL HIGH (ref 52–75)
Nucleated RBC: 0 /100 WBC
Platelets: 202 10*3/uL (ref 140–400)
RBC: 4.28 10*6/uL — ABNORMAL LOW (ref 4.70–6.00)
RDW: 14 % (ref 12–15)
WBC: 6.39 10*3/uL (ref 3.50–10.80)

## 2011-05-25 LAB — HEMOLYSIS INDEX: Hemolysis Index: 8 Index (ref 0–18)

## 2011-05-25 LAB — GFR: EGFR: 45.2

## 2011-05-25 NOTE — H&P (Signed)
[name], [name]                                       MRN:          [id number]                                                          Account:      [id number]                                        Document ID:  [id number]                                                                                                                                        Admit Date: 05/24/2011     Patient Location: Z3664-40  Patient Type: V     ATTENDING PHYSICIAN: Arther Dames, MD        ADMISSION DIAGNOSES:  1.  Right renal colic.  2.  Obstructing right ureteral stone with hydronephrosis.     HISTORY OF PRESENT ILLNESS:  This is a patient well known to my office who presented 2 weeks ago with a  history of right flank pain.  The patient had a CT scan, which revealed an  11-mm stone obstructing at the UPJ.  The patient underwent extracorporal  shockwave lithotripsy and presented today with severe right flank pain.  A  repeat CT scan revealed an 8-mm stone at the mid right ureter; therefore,  the patient admitted for pain management, hydration and further treatment  of obstructing stone.     PAST MEDICAL HISTORY:  Again significant for kidney stones, history of hypertension, gout.     PAST SURGICAL HISTORY:  Status post right leg surgery, status post extracorporal shockwave  lithotripsy, status post Achilles tendon surgery to the right ankle, a left  knee meniscus repair and surgery to the right ankle.     PSYCHIATRIC HISTORY:  None.     SOCIAL HISTORY:  The patient smoked tobacco, but quit 10 years ago.  No alcohol.  No drug  abuse.     FAMILY HISTORY:  No family history of prostate cancer.     REVIEW OF SYSTEMS:  CONSTITUTIONAL:  The patient reports fever, no chills.  ENT:  Negative for review of system.  CARDIOVASCULAR:  No chest pain.  RESPIRATORY:  No shortness of breath or cough.  GASTROINTESTINAL:  The patient complained of nausea, no vomiting with the  Page 1 of 3  Francis Trevino, Francis Trevino                                       MRN:          [id number]                                                          Account:      [id number]                                        Document ID:  [id number]                                                                                                                                        right abdominal pain, no diarrhea.  GENITOURINARY:  No frequency, dysuria, hematuria.  MUSCULOSKELETAL:  Significant for right back pain.  SKIN:  No skin rashes.  NEUROLOGICAL:  No headache or dizziness.  ENDOCRINE:  The patient denies any polyuria or polydipsia.  HEMATOLOGY AND ONCOLOGY:  No easy bruisability.  No lymph node enlargement.  SKIN:  No skin rashes.     MEDICATIONS ON ADMISSION:  Included back lisinopril, hydrochlorothiazide.     ALLERGIES:  BACTRIM and SULFA.     PHYSICAL EXAMINATION ON ADMISSION:  VITAL SIGNS:  On admission, the patient had a blood pressure of 137/87.  HEENT:  Head is atraumatic, normocephalic.  CHEST:  Clear on auscultation anterior, posterior.  HEART:  Regular rate and rhythm, no murmurs appreciated.  ABDOMEN:  Obese, bowel sounds are present, right CVA tenderness.  No  organomegaly.  GENITALIA:  Bilateral descended testicles, normal phallus.  RECTAL:  Digital rectal examination deferred.  EXTREMITIES:  No evidence of venostasis or edema.  NEUROLOGIC:  Alert, oriented, no evidence of neurological deficit.  SKIN:  No skin rashes.     LABORATORY DATA:  On admission, significant for a GFR of 37.1, BUN 29, creatinine 1.9.  White  blood count 7.2.  Urine revealed too numerous to count red blood cells.     RADIOGRAPHIC STUDIES\"  A CT scan revealed an 8-mm at the right mid ureter with obstruction.     IMPRESSION:  1.  Right hydronephrosis.  2.  Right flank pain.  3.  Obstructing right ureteral stone.  4.  Acute renal failure.     PLAN:  We will admit the patient for hydration, pain  management, and further  relief of obstructing stone.  Page 2 of 3  Francis Trevino, Francis Trevino                                       MRN:          [id number]                                                          Account:      [id number]                                        Document ID:  [id number]                                                                                                                                              Electronic Signing Provider     D:  05/24/2011 18:49 PM by Dr. Ann Held. Karel Jarvis, MD [phone])  T:  05/24/2011 22:46 PM by [id number]        cc:                                                                                                           Page 3 of 3  Authenticated by Ann Held. Karel Jarvis, MD [phone]) On 05/25/2011 07:54:33 AM

## 2011-05-25 NOTE — Op Note (Signed)
DEONTRE, ALLSUP                                       MRN:          [id number]                                                          Account:      [id number]                                        Document ID:  [id number]                                                   Procedure Date: 05/24/2011                                                                                    Admit Date: 05/24/2011     Patient Location: O1308-65  Patient Type: V     SURGEON: Prescilla Sours MD  ASSISTANT:        PREOPERATIVE DIAGNOSES:  1.  Right renal colic.  2.  Obstructing right ureteral stone.  3.  Renal failure.     POSTOPERATIVE DIAGNOSES:  1.  Right renal colic.  2.  Obstructing right ureteral stone.  3.  Renal failure.     TITLE OF PROCEDURE:  1.  Cystoscopy.  2.  Right ureteroscopy.  3.  Laser lithotripsy.  4.  Placement of double-J stent.     ANESTHESIA:  General.     ESTIMATED BLOOD LOSS:  Minimal.     COMPLICATIONS:  None.     INDICATIONS:  This is a 56 year old male who presented to the emergency room with acute  onset of right renal colic.  The patient had previously a large stone  diagnosed over a week ago and appeared to be located at the UPJ 11 mm in  size.  At that time, I discussed with the patient that a shockwave  lithotripsy may require another treatment since the stone size is very  large.  At that time, the patient underwent a shockwave lithotripsy and  sent home.  A few days later, he returned to the emergency room with  hematuria and right flank pain.  A CAT scan obtained revealed an 8-mm stone  obstructing the right mid ureter.  Therefore, the patient was admitted for  hydration, pain management, and further treatment of obstructing stone.  Page 1 of 2  ZOHAR, MARONEY                                       MRN:          [id number]                                                          Account:       [id number]                                        Document ID:  [id number]                                                   Procedure Date: 05/24/2011                                                                                    Discussed with the patient the above procedure with all risks and  complication including infection and bleeding, ureteral injury, unable to  access the stone, possible nephrostomy tube placement and repeat shock wave  lithotripsy as well as laser lithotripsy due to the significant large size  of the stone and significant edema around the stone as well.  The patient  agreed to the above.     DESCRIPTION OF PROCEDURE:  The patient was taken to the cystoscopy suite room, and after achievement  of effective general anesthesia, lower abdomen, genitalia, and perineum  were prepped and draped in usual sterile fashion.  The patient was then  placed prophylactic IV antibiotics, and utilizing a 22-French cystoscope  with a 30-degree lens in place, anterior urethra was traversed without  difficulty.  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  Bladder was entered, evaluated in a  systemic fashion.  The bladder wall essentially appeared to be normal.  There was no evidence of bladder tumor, diverticula, or stones.  Then, a  guidewire was introduced and advanced all the way up to the renal pelvis.  Ureteroscopy was performed and the scope was advanced, and the stone  appeared to be located in the mid right ureter with significant mucosal  edema.  Laser lithotripsy was performed with the stone completely  fragmented, and some of the fragments was extracted and removed.  Further  inspection revealed no evidence of ureteral injury.  Possibly there may be  some fragments have migrated to the upper calix of the kidney.  Therefore,  a double-J stent, size 26 cm, 6-French, was placed over the guidewire under  fluoroscopic guidance without  difficulty.  Proximal end seen in the upper  calix  and distal end was in the urinary bladder with good efflux.  Bladder  was emptied, cystoscope was removed, 2% Xylocaine injected transurethrally  for postoperative pain control.  The patient tolerated the procedure well  and transferred to the recovery room in satisfactory condition.           Electronic Signing Provider     D:  05/25/2011 07:54 AM by Dr. Ann Held. Karel Jarvis, MD [phone])  T:  05/25/2011 11:43 AM by [email or web address]        cc:                                                                                                           Page 2 of 2  Authenticated by Ann Held. Karel Jarvis, MD [phone]) On 05/25/2011 06:17:17 PM

## 2011-05-26 ENCOUNTER — Encounter (INDEPENDENT_AMBULATORY_CARE_PROVIDER_SITE_OTHER): Payer: Self-pay | Admitting: Family Medicine

## 2011-05-26 DIAGNOSIS — D3500 Benign neoplasm of unspecified adrenal gland: Secondary | ICD-10-CM | POA: Insufficient documentation

## 2011-05-26 LAB — LAB USE ONLY - HISTORICAL SURGICAL PATHOLOGY

## 2011-05-30 LAB — STONE ANALYSIS

## 2011-06-01 ENCOUNTER — Encounter (INDEPENDENT_AMBULATORY_CARE_PROVIDER_SITE_OTHER): Payer: Self-pay | Admitting: Family Medicine

## 2011-06-01 ENCOUNTER — Ambulatory Visit
Admit: 2011-06-01 | Discharge: 2011-06-01 | Disposition: A | Discharge status: Home / Self Care | Payer: Self-pay | Source: Ambulatory Visit

## 2011-06-01 LAB — BASIC METABOLIC PANEL
BUN: 24 mg/dL — ABNORMAL HIGH (ref 8.0–20.0)
CO2: 23 mEq/L (ref 21–30)
Calcium: 9.7 mg/dL (ref 8.5–10.5)
Chloride: 104 mEq/L (ref 96–109)
Creatinine: 1.2 mg/dL (ref 0.5–1.5)
Glucose: 124 mg/dL — ABNORMAL HIGH (ref 70–100)
Potassium: 4.1 mEq/L (ref 3.5–5.3)
Sodium: 139 mEq/L (ref 135–146)

## 2011-06-01 LAB — HEMOLYSIS INDEX: Hemolysis Index: 12 Index — ABNORMAL HIGH (ref 0–9)

## 2011-06-01 LAB — URIC ACID: Uric acid: 9 mg/dL — ABNORMAL HIGH (ref 3.4–7.2)

## 2011-06-01 LAB — GFR: EGFR: >60

## 2011-10-29 ENCOUNTER — Ambulatory Visit (INDEPENDENT_AMBULATORY_CARE_PROVIDER_SITE_OTHER): Admitting: Family Medicine

## 2011-10-29 ENCOUNTER — Encounter (INDEPENDENT_AMBULATORY_CARE_PROVIDER_SITE_OTHER): Payer: Self-pay | Admitting: Family Medicine

## 2011-10-29 VITALS — BP 133/76 | HR 82 | Temp 98.4°F | Resp 16 | Ht 75.0 in | Wt 330.0 lb

## 2011-10-29 DIAGNOSIS — D3500 Benign neoplasm of unspecified adrenal gland: Secondary | ICD-10-CM

## 2011-10-29 DIAGNOSIS — Z125 Encounter for screening for malignant neoplasm of prostate: Secondary | ICD-10-CM

## 2011-10-29 DIAGNOSIS — I10 Essential (primary) hypertension: Secondary | ICD-10-CM

## 2011-10-29 DIAGNOSIS — Z1159 Encounter for screening for other viral diseases: Secondary | ICD-10-CM

## 2011-10-29 DIAGNOSIS — Z0000 Encounter for general adult medical examination without abnormal findings: Secondary | ICD-10-CM

## 2011-10-29 DIAGNOSIS — R21 Rash and other nonspecific skin eruption: Secondary | ICD-10-CM

## 2011-10-29 DIAGNOSIS — Z114 Encounter for screening for human immunodeficiency virus [HIV]: Secondary | ICD-10-CM

## 2011-10-29 DIAGNOSIS — Z23 Encounter for immunization: Secondary | ICD-10-CM

## 2011-10-29 DIAGNOSIS — Z1211 Encounter for screening for malignant neoplasm of colon: Secondary | ICD-10-CM

## 2011-10-29 DIAGNOSIS — E669 Obesity, unspecified: Secondary | ICD-10-CM

## 2011-10-29 LAB — POCT URINALYSIS DIPSTIX (10)(MULTI-TEST)
Bilirubin, UA POCT: NEGATIVE
Blood, UA POCT: NEGATIVE
Glucose, UA POCT: NEGATIVE mg/dL
Ketones, UA POCT: NEGATIVE mg/dL
Nitrite, UA POCT: NEGATIVE
POCT Leukocytes, UA: NEGATIVE
POCT Spec Gravity, UA: 1.02 (ref 1.001–1.035)
POCT pH, UA: 6 (ref 5–8)
Protein, UA POCT: NEGATIVE mg/dL
Urobilinogen, UA: 0.2 mg/dL

## 2011-10-29 MED ORDER — LISINOPRIL-HYDROCHLOROTHIAZIDE 20-25 MG PO TABS
1.00 | ORAL_TABLET | Freq: Every day | ORAL | Status: DC
Start: 2011-10-29 — End: 2011-12-22

## 2011-10-29 MED ORDER — KETOCONAZOLE 2 % EX CREA
TOPICAL_CREAM | Freq: Every day | CUTANEOUS | Status: AC
Start: 2011-10-29 — End: 2012-10-28

## 2011-10-29 NOTE — Patient Instructions (Signed)
Losing Weight (Cardiovascular)  Excess weight is a major risk factor for heart disease. Losing weight may help keep your arteries open so that your heart can get the oxygen-rich blood it needs. Weight loss can also help lower your blood pressure and reduce your risk for diabetes. All in all, losing weight makes you healthier.     Exercise with a friend. When activity is fun, you're more likely to stick with it.   Calories and Weight Loss   Calories are the fuel your body burns for energy. You get the calories you need from the food you eat. For healthy weight loss, women should eat at least 1,200 calories a day, men at least 1,500.   When you eat more calories than you need, your body stores the extra calories as fat. One pound of fat equals 3,500 calories.   To lose weight, try to burn 500 calories a day more than you eat. To do this, eat 250 calories less each day. Add activity to burn the other 250 calories. Walking 21/73miles burns about 250 calories.   Eat a variety of healthy foods. It's the best way to make calories count.  Tips for losing weight:   Drink 8 to 10 glasses of water a day.   Don't skip meals. Instead, eat smaller portions.   Brisk Activity Is Best  Brisk activity gets your heart pumping faster. It makes your heart healthier. It's also the best way to burn calories. In fact, your body may keep burning calories for hours after you stop a brisk activity.   Begin by walking 10 minutes most days.   Add more time and speed to your walk. Build up as you feel able.   Try to walk briskly at least 30 minutes most days. If needed, you can break this into 2 shorter sessions.  Check off the ideas below that you could try to make your day more active:   Take the stairs instead of the elevator.   Park your car farther away and walk.   Ride a bike to work or to the store.   Walk laps around the mall.      [address], [address] [place], [place] [address]. All rights reserved.  This information is not intended as a substitute for professional medical care. Always follow your healthcare professional's instructions.

## 2011-10-29 NOTE — Progress Notes (Signed)
Subjective:       Patient ID: Francis Trevino is a 56 y.o. male.    HPI  Well Adult Physical   Patient here for a comprehensive physical exam.The patient reports problems - needing refills for his HTN. Last seen in office 10/2010. BP was controlled then on Lisinopril-HCTZ 20-25mg  daily. No regular exercise, diet generally unhealthy. Denies chest pain, SOB, headaches, vision changes. Father had CAD.    Problem   Adrenal Adenoma    CT abdomen/pelvis without contrast 05/24/11 for right flank pain revealed stable bilateral adrenal adenomas. Denies labile blood pressures, abdominal pain, nausea, vomiting.      Benign Essential Hypertension    Diagnosed for more than 10 years. Taking Lisinopril-HCTZ 20-25mg  daily. Compliant with medications. No regular exercise, diet generally unhealthy. Denies chest pain, SOB, headaches, vision changes. Father had CAD.       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    Do you take any herbs or supplements that were not prescribed by a doctor? no   Are you taking calcium supplements? no   Are you taking aspirin daily? no    GU History:  Last prostate exam and PSA done in 2010 by his Urologist and were normal  Any STD's in the past? none  In a monogamous heterosexual relationship with spouse    The following portions of the patient's history were reviewed and updated as appropriate: He  has a past medical history of Pain in Achilles tendon (2007); Kidney calculi (05/2010); Hypertension; History of gout; and Calcium oxalate kidney stones (05/2010).  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  His family history includes Alzheimer's disease in his mother; Diabetes in his father; and Heart disease in his father.  There is no history of Hypertension and Hyperlipidemia.  He  reports that he quit smoking about 15 years ago. His smoking use included Cigarettes. He has a  30 pack-year smoking history. He does not have any smokeless tobacco history on file. He reports that he drinks alcohol. His drug history not on file.  He has a current medication list which includes the following prescription(s): lisinopril-hydrochlorothiazide, naproxen sodium, diclofenac sodium, ketoconazole, and naproxen.  He is allergic to sulfa antibiotics..    Review of Systems   Constitutional: Negative for fever, chills and fatigue.   HENT: Negative for hearing loss and congestion.    Eyes: Negative for visual disturbance.   Respiratory: Negative for cough, chest tightness and shortness of breath.    Cardiovascular: Negative for chest pain, palpitations and leg swelling.   Gastrointestinal: Negative for abdominal pain, diarrhea, constipation, blood in stool and anal bleeding.   Genitourinary: Negative for dysuria, frequency, hematuria, discharge and testicular pain.   Musculoskeletal: Negative for myalgias.   Skin: Positive for rash.   Neurological: Negative for headaches.   Psychiatric/Behavioral: Negative for sleep disturbance.         Objective:    Physical Exam   Vitals reviewed.  Constitutional: He is oriented to person, place, and time. No distress.   HENT:   Head: Normocephalic and atraumatic.   Right Ear: Hearing and external ear normal.   Left Ear: Hearing and external ear normal.   Nose: Nose normal. No mucosal edema, rhinorrhea or septal deviation.   Mouth/Throat: Uvula is midline, oropharynx is clear and moist and mucous membranes are normal. No oropharyngeal exudate or tonsillar abscesses.  Eyes: Conjunctivae normal, EOM and lids are normal. No scleral icterus. Right pupil is round and reactive. Left pupil is not round and not reactive. Pupils are unequal.       Neck: Normal range of motion. Neck supple. Carotid bruit is not present. No thyromegaly present.   Cardiovascular: Normal rate, regular rhythm, S1 normal, S2 normal, normal heart sounds, intact distal pulses and normal pulses.    No  murmur heard.  Pulmonary/Chest: Effort normal and breath sounds normal. No respiratory distress. He has no decreased breath sounds. He has no wheezes. He has no rhonchi. He has no rales. He exhibits no mass. Right breast exhibits no mass, no nipple discharge and no tenderness. Left breast exhibits no mass, no nipple discharge and no tenderness. Breasts are symmetrical.   Abdominal: Soft. Normal appearance and bowel sounds are normal. He exhibits no distension and no mass. There is no tenderness. There is no rebound and no guarding. Hernia confirmed negative in the right inguinal area and confirmed negative in the left inguinal area.   Genitourinary: Prostate normal, testes normal and penis normal. Rectal exam shows external hemorrhoid (5 o'clock, moderate-sized, nontender) and internal hemorrhoid. Rectal exam shows no fissure, no mass and anal tone normal. Prostate is not enlarged and not tender. Right testis shows no mass, no swelling and no tenderness. Right testis is descended. Left testis shows no mass, no swelling and no tenderness. Left testis is descended. Circumcised.              DRE not indicated due to age and absence of symptoms   Musculoskeletal: Normal range of motion. He exhibits no edema.   Lymphadenopathy:     He has no cervical adenopathy.        Right: No supraclavicular adenopathy present.        Left: No supraclavicular adenopathy present.   Neurological: He is alert and oriented to person, place, and time. He has normal strength and normal reflexes. No cranial nerve deficit or sensory deficit. Gait normal.   Skin: Skin is warm and dry. He is not diaphoretic. No pallor.   Psychiatric: He has a normal mood and affect. His speech is normal and behavior is normal. Judgment and thought content normal. His mood appears not anxious. He is not agitated.     BP 133/76  Pulse 82  Temp(Src) 98.4 F (36.9 C) (Oral)  Resp 16  Ht 1.905 m (6\' 3" )  Wt 149.687 kg (330 lb)  BMI 41.25 kg/m2    Hearing  Screening  Edited by: Dorethea Clan, LPN         125hz   250hz   500hz   1000hz   2000hz   4000hz   8000hz      Right ear     Pass  Pass  Pass  Pass     Left ear     Pass  Pass  Pass  Pass     Method: Audiometry        Vision Screening  Edited by: Dorethea Clan, LPN         Right eye  Left eye  Both eyes     Without correction  20/20  20/20  20/20          Assessment:       1. Annual physical exam  TSH with Free T4, Reflex, PSA, Lipid panel, HIV Antibodies, HIV-1/2, EIA with Reflex, Comprehensive metabolic panel, CBC without differential, Hemoglobin A1C, Hearing screen, Visual acuity screening, POCT UA Dipstix (10)(Multi-Test), Ambulatory referral to Gastroenterology  2. Adrenal adenoma  Ambulatory referral to Endocrinology   3. Benign essential hypertension  lisinopril-hydrochlorothiazide (PRINZIDE,ZESTORETIC) 20-25 MG per tablet   4. Groin rash  ketoconazole (NIZORAL) 2 % cream   5. Screening for prostate cancer  PSA   6. Obesity  TSH with Free T4, Reflex, Lipid panel, Comprehensive metabolic panel, Hemoglobin A1C   7. Screening for HIV (human immunodeficiency virus)  HIV Antibodies, HIV-1/2, EIA with Reflex   8. Screening for colon cancer  Ambulatory referral to Gastroenterology   9. Need for Tdap vaccination  Tdap vaccine greater than or equal to 7yo IM           Plan:       Annual Physical:   Patient Counseling:  --Nutrition: Stressed importance of moderation in sodium/caffeine intake, saturated fat and cholesterol, caloric balance, sufficient intake of fresh fruits, vegetables, dietary fiber, calcium, iron.  --Discussed about the benefits and risks of taking a baby aspirin daily for men ages 78 to 72 to prevent myocardial infarctions.  --Exercise: Stressed the importance of regular exercise.   --Substance Abuse: Discussed cessation/primary prevention of tobacco, alcohol, or other drug use; driving or other dangerous activities under the influence; availability of treatment for abuse.    --Sexuality: Discussed  sexually transmitted diseases, partner selection, use of condoms, avoidance of unintended pregnancy  and contraceptive alternatives.   --Injury prevention: Discussed safety belts, safety helmets, smoke detector, smoking near bedding or upholstery.   --Dental health: Discussed importance of regular tooth brushing, flossing, and dental visits.  --Immunizations reviewed. Counseled on risks and benefits of each individual vaccine and patient appears to understand. Tdap given today.  --Discussed benefits of screening colonoscopy. Referral to GI given.   --Discussed risks and benefits of prostate cancer screening. Per AUA, greatest evidence of benefit of routine screening was found in men ages 74-69. Patient opted to be screened with DRE and PSA.  --After hours service discussed with patient.   --Discussed the patient's BMI with him.  Body mass index is 41.25 kg/(m^2).Marland Kitchen The BMI is above average. Recommended 5-10% weight loss through healthier diet and regular exercise.   --Labs: TSH with Free T4, Reflex, PSA, Lipid panel, HIV Antibodies, HIV-1/2, EIA with Reflex, Comprehensive metabolic panel, CBC without differential, Hemoglobin A1C, Hearing screen, Visual acuity screening. POCT UA Dipstix (10)(Multi-Test) negative for all components.     Adrenal adenomas - Stable on CT abdomen pelvis 05/2011. Will refer to Endocrinology for further evaluation and treatment. Pt given copy of CT report.    Benign essential HTN - Controlled. Initial BP uncontrolled but normalized after rest. Continue Lisinopril-HCTZ 20-25mg  daily.    Groin rash - Start Ketoconazole 2% cream daily-BID until 1 week after rash resolves. Keep groin area dry.        RTC 2 weeks for HTN follow-up and discuss fasting lab results. Will complete DOT forms then.

## 2011-11-02 ENCOUNTER — Ambulatory Visit (INDEPENDENT_AMBULATORY_CARE_PROVIDER_SITE_OTHER)

## 2011-11-02 DIAGNOSIS — Z114 Encounter for screening for human immunodeficiency virus [HIV]: Secondary | ICD-10-CM

## 2011-11-02 DIAGNOSIS — Z125 Encounter for screening for malignant neoplasm of prostate: Secondary | ICD-10-CM

## 2011-11-02 DIAGNOSIS — E669 Obesity, unspecified: Secondary | ICD-10-CM

## 2011-11-02 DIAGNOSIS — Z1159 Encounter for screening for other viral diseases: Secondary | ICD-10-CM

## 2011-11-02 DIAGNOSIS — Z0000 Encounter for general adult medical examination without abnormal findings: Secondary | ICD-10-CM

## 2011-11-03 ENCOUNTER — Encounter (INDEPENDENT_AMBULATORY_CARE_PROVIDER_SITE_OTHER): Payer: Self-pay | Admitting: Family Medicine

## 2011-11-03 DIAGNOSIS — E785 Hyperlipidemia, unspecified: Secondary | ICD-10-CM | POA: Insufficient documentation

## 2011-11-03 LAB — COMPREHENSIVE METABOLIC PANEL
ALT: 24 U/L (ref 9–46)
AST (SGOT): 25 U/L (ref 10–35)
Albumin/Globulin Ratio: 2.1 (ref 1.0–2.5)
Albumin: 4.6 G/DL (ref 3.6–5.1)
Alkaline Phosphatase: 70 U/L (ref 40–115)
BUN: 23 MG/DL (ref 7–25)
Bilirubin, Total: 0.4 MG/DL (ref 0.2–1.2)
CO2: 24 mmol/L (ref 19–30)
Calcium: 9.1 MG/DL (ref 8.6–10.3)
Chloride: 104 mmol/L (ref 98–110)
Creatinine: 1.06 mg/dL (ref 0.70–1.33)
EGFR African American: 92 mL/min/{1.73_m2} (ref >=60)
EGFR: 79 mL/min/{1.73_m2} (ref >=60)
Globulin: 2.2 G/DL (ref 1.9–3.7)
Glucose: 188 MG/DL — ABNORMAL HIGH (ref 65–99)
Potassium: 4.3 mmol/L (ref 3.5–5.3)
Protein, Total: 6.8 G/DL (ref 6.1–8.1)
Sodium: 138 mmol/L (ref 135–146)

## 2011-11-03 LAB — CBC
Hematocrit: 40.8 % (ref 38.5–50.0)
Hemoglobin: 13.5 g/dL (ref 13.2–17.1)
MCH: 28.5 pg (ref 27–33)
MCHC: 33 g/dL (ref 32–36)
MCV: 86 fL (ref 80–100)
MPV: 8.4 fL (ref 7.5–11.5)
Platelets: 158 10*3/uL (ref 140–400)
RBC: 4.73 10*6/uL (ref 4.20–5.80)
RDW: 15.7 % — ABNORMAL HIGH (ref 11.0–15.0)
WBC: 5.5 10*3/uL (ref 3.8–10.8)

## 2011-11-03 LAB — TSH WITH REFLEX TO FREE T4: TSH: 1.96 mIU/L (ref 0.40–4.50)

## 2011-11-03 LAB — LIPID PANEL
Cholesterol / HDL Ratio: 4.8 (ref 0.0–5.0)
Cholesterol: 164 MG/DL (ref 125–200)
HDL: 34 MG/DL — AB (ref >=40)
LDL Calculated: 74 MG/DL (ref <130)
Non HDL Cholesterol (LDL and VLDL): 130 mg/dL
Triglycerides: 279 MG/DL — ABNORMAL HIGH (ref <150)

## 2011-11-03 LAB — HIV ANTIBODIES, HIV-1/2, EIA WITH REFLEXES: HIV 1/2 Antibody: NONREACTIVE

## 2011-11-03 LAB — HEMOGLOBIN A1C: Hemoglobin A1C: 8.6 % — ABNORMAL HIGH (ref <5.7)

## 2011-11-03 LAB — PSA: Prostate Specific Antigen, Total: 0.1 ng/mL (ref 0.0–4.0)

## 2011-11-12 ENCOUNTER — Ambulatory Visit (INDEPENDENT_AMBULATORY_CARE_PROVIDER_SITE_OTHER): Admitting: Family Medicine

## 2011-11-12 ENCOUNTER — Encounter (INDEPENDENT_AMBULATORY_CARE_PROVIDER_SITE_OTHER): Payer: Self-pay | Admitting: Family Medicine

## 2011-11-12 VITALS — BP 123/84 | HR 102 | Temp 98.5°F | Ht 75.0 in | Wt 326.0 lb

## 2011-11-12 DIAGNOSIS — E119 Type 2 diabetes mellitus without complications: Secondary | ICD-10-CM

## 2011-11-12 DIAGNOSIS — I10 Essential (primary) hypertension: Secondary | ICD-10-CM

## 2011-11-12 DIAGNOSIS — R21 Rash and other nonspecific skin eruption: Secondary | ICD-10-CM

## 2011-11-12 DIAGNOSIS — E785 Hyperlipidemia, unspecified: Secondary | ICD-10-CM

## 2011-11-12 DIAGNOSIS — D3500 Benign neoplasm of unspecified adrenal gland: Secondary | ICD-10-CM

## 2011-11-12 MED ORDER — SIMVASTATIN 20 MG PO TABS
20.00 mg | ORAL_TABLET | Freq: Every evening | ORAL | Status: DC
Start: 2011-11-12 — End: 2011-12-22

## 2011-11-12 MED ORDER — GLIPIZIDE ER 5 MG PO TB24
5.00 mg | ORAL_TABLET | Freq: Every day | ORAL | Status: DC
Start: 2011-11-12 — End: 2011-12-22

## 2011-11-12 MED ORDER — METFORMIN HCL 500 MG PO TABS
1000.00 mg | ORAL_TABLET | Freq: Two times a day (BID) | ORAL | Status: DC
Start: 2011-11-12 — End: 2011-12-22

## 2011-11-12 NOTE — Patient Instructions (Signed)
DIET: LOW CHOLESTEROL  Cholesterol is needed by the body to build new cells and create certain hormones. There are two kinds of cholesterol in the blood:      "Good" cholesterol prevents fat deposits (plaque) from building up in the arteries. In this way it protects against heart disease and stroke.    "Bad" cholesterol stays in the body and sticks to artery walls. It may eventually block blood flow to the heart and brain causing heart attack or stroke.   75% of the body's cholesterol is made in the liver. Only 25% of the body's cholesterol comes from the food you eat. While the amount of cholesterol in your diet should be limited, it is the cholesterol that your body makes that creates the greatest disease risk. The biggest influence on cholesterol made by your body is the mixture of fat types in your diet. There are two kinds of fats you can eat:   "Good Fats" are the unsaturated fats (mono-saturated and poly-unsaturated). They raise the level of good cholesterol and lower the level of bad cholesterol. Good fats are found in vegetable oils such as olive, sunflower, corn and soybean oils, and in nuts and seeds.    "Bad Fats" are the saturated fats (including foods high in cholesterol) and "trans" fats. These increase the risk of disease. They lower the good cholesterol and raise the level of bad cholesterol. Bad fats are found in meat and whole-milk dairy products. Some plants are also high in bad fats (coconut and palm plants). Trans fats are found in hard (stick) margarines and many fast foods and commercially baked goods. Soft margarine sold in tubs has less trans fats and are safer to use.  High blood cholesterol usually is a result of a diet high in saturated fat combined with an inactive lifestyle. In some cases, genetics plays a role in causing high cholesterol. The following tips will help you create healthy eating habits that will help lower your blood cholesterol level.  STEPS TO CREATING A DIET  HIGH IN GOOD FAT, LOW IN BAD FATS (and low in cholesterol)  1. Consult with your doctor before starting a low cholesterol diet or weight loss program.   2. Learn to read nutrition labels and select appropriate portion sizes.   3. When cooking, use plant-based unsaturated vegetable oils (sunflower, corn, soybean, canola, peanut, and olive oils).   4. Avoid saturated oils found in animal products such as meat, dairy (whole-milk, cheese and ice cream), poultry skin, and egg yolks. Plants high in saturated oils include coconut and coconut oil, palm oil and palm kernel oil.  5. If you eat meat, choose smaller portions and lean cuts.  6. Replace meat with fish at least two times a week. Fish is an important source of the unsaturated fat called omega-3 fatty acids. This fat has potential to lower the risk of heart disease.   7. Replace whole-milk dairy products with low-fat or nonfat products. Try soy products. Soy helps to reduce total cholesterol.   8. Supplement your diet with protective fibers. Eat nuts, seeds, and whole grains rather than white rice and bread. These foods lower both cholesterol and triglyceride levels. (Triglycerides are another fat found in the blood.) Walnuts are one of the best sources of an omega-3 fatty acid.  9. Eat plenty of fresh fruits and vegetables daily.  10. Avoid fast foods and commercial baked goods. Assume they contain saturated fat unless labeled otherwise.   [address], [place] [address]. All rights reserved. This information is not intended as a substitute for professional medical care. Always follow your healthcare professional's instructions.

## 2011-11-12 NOTE — Progress Notes (Signed)
Subjective:       Patient ID: Francis Trevino is a 56 y.o. male.    HPI  Here for follow-up HTN, complete DOT physical form and discuss results of labs from recent annual physical.    Problem   Hyperlipidemia    Fasting lipids 11/02/11 showed chol 164, trig 279, HDL 34, LDL 74, non-HDL 130. Not on statins. Father had CABG for CAD in his 56s.      Type II Diabetes Mellitus    Newly diagnosed. HgA1c 8.6, fasting glucose 188 on 11/02/11. No prior diagnosis of DM. Crea 1.06, eGFR 79. Urine microalbumin today. Had recent eye exam 10/2011 and no mention on retinopathy.      Adrenal Adenoma    CT abdomen/pelvis without contrast 05/24/11 for right flank pain revealed stable bilateral adrenal adenomas. Denies labile blood pressures, abdominal pain, nausea, vomiting. Will refer to Endocrinology for further evaluation and treatment. Has not scheduled an appt yet.     Benign Essential Hypertension    Diagnosed for more than 10 years. Compliant with Lisinopril-HCTZ 20-25mg  daily. Home BP readings 125/86-90. No regular exercise, diet generally unhealthy. Denies chest pain, SOB, headaches, vision changes. Father had CAD.       Rash in groin area has been improving with the use of Ketoconazole topical BID. Has been using it also on his axillae with has the same rash.    The following portions of the patient's history were reviewed and updated as appropriate: allergies, current medications, past family history, past medical history, past social history, past surgical history and problem list.    Review of Systems   Constitutional: Negative for fever, chills and fatigue.   Eyes: Negative for visual disturbance.   Respiratory: Negative for cough, chest tightness, shortness of breath and wheezing.    Cardiovascular: Negative for chest pain, palpitations and leg swelling.   Genitourinary: Negative for frequency and difficulty urinating.   Neurological: Negative for numbness.   Psychiatric/Behavioral: Negative for sleep disturbance.          Objective:    Physical Exam   Vitals reviewed.  Constitutional: He is oriented to person, place, and time. No distress.   HENT:        Mallampati III   Cardiovascular: Normal rate, regular rhythm, normal heart sounds and intact distal pulses.    No murmur heard.  Pulmonary/Chest: Effort normal and breath sounds normal. No respiratory distress. He has no wheezes. He has no rales.   Neurological: He is alert and oriented to person, place, and time.   Skin: Skin is warm and dry. He is not diaphoretic.   Psychiatric: He has a normal mood and affect. His behavior is normal. Judgment normal.     BP 123/84  Pulse 102  Temp(Src) 98.5 F (36.9 C) (Oral)  Ht 1.905 m (6\' 3" )  Wt 147.873 kg (326 lb)  BMI 40.75 kg/m2  SpO2 98%      Assessment:       1. Newly diagnosed diabetes  Ambulatory referral to Diabetic Education, metFORMIN (GLUCOPHAGE) 500 MG tablet, glipiZIDE XL (GLIPIZIDE XL) 5 MG 24 hr tablet, Microalbumin, Random Urine, Hemoglobin A1C, Microalbumin, Random Urine, Comprehensive metabolic panel, ECG 12 lead   2. Benign essential hypertension  Comprehensive metabolic panel   3. Hyperlipidemia  simvastatin (ZOCOR) 20 MG tablet, Lipid panel, Comprehensive metabolic panel   4. Rash of groin     5. Adrenal adenoma     6. Type II diabetes mellitus  Plan:       Newly-diagnosed Type II DM - Monofilament foot exam and proprioception normal 11/12/11. EKG done today showed normal sinus rhythm, no prior tracings for comparison. Refer to Diabetic Educator for further evaluation and treatment.  Start Metformin 500mg  daily to gradually increase to 1000mg  BID (500mg  increments every week). Continue ASA 81mg  daily, and Lisinopril for renoprotection and HTN. Urine microalbumin today. Repeat fasting labs and HgA1c in 3 months with urine microalbumin (02/2012).    Benign essential HTN - Controlled. Continue Lisinopril-HCTZ 20-25mg  daily. Goal BP < 140/90 (since no nephropathy).    Hyperlipidemia - Elevated trig and low HDL.  LDL goal < 100. Start Simvastatin 20mg  qHs. Referral to Nutritionist. Repeat fasting lipids and CMP in 3 months (02/2012).    Rash of groin - Improving. Continue Ketoconazole topical BID until 1 week after lesion resolves. Keep areas dry.    Adrenal adenoma - Schedule appt with Endo.      Return in about 3 months (around 02/11/2012) for hyperlipidemia, DM with fasting labs 1 week prior.        ** DOT forms completes, cleared only for 6 months due to poorly-controlled DM. HgA1c acceptable 7-10%.    Over 50% of this 40-minute visit spent on counseling and coordination of care of multiple medical issues described above.

## 2011-11-14 LAB — MICROALBUMIN, RANDOM URINE
Creatinine, UR: 81 mg/dL (ref 20–370)
Microalbumin MCG/MG Creatinine: 4.9 mcg/mg crea
Microalbumin, UR MG/DL: 0.4 mg/dL

## 2011-11-16 ENCOUNTER — Encounter (INDEPENDENT_AMBULATORY_CARE_PROVIDER_SITE_OTHER): Payer: Self-pay | Admitting: Family Medicine

## 2011-11-22 ENCOUNTER — Encounter (INDEPENDENT_AMBULATORY_CARE_PROVIDER_SITE_OTHER): Payer: Self-pay | Admitting: Family Medicine

## 2011-12-02 ENCOUNTER — Other Ambulatory Visit (INDEPENDENT_AMBULATORY_CARE_PROVIDER_SITE_OTHER): Payer: Self-pay | Admitting: Family Medicine

## 2011-12-02 DIAGNOSIS — E119 Type 2 diabetes mellitus without complications: Secondary | ICD-10-CM

## 2011-12-02 MED ORDER — FREESTYLE SYSTEM KIT
PACK | Status: AC
Start: 2011-12-02 — End: 2012-12-01

## 2011-12-02 NOTE — Telephone Encounter (Signed)
Pt called inquiring for diabetes meter, pt stated he had previously sent a message through mychart on 11/22/11 with his request to the dr., pt is going out of town and needs a hard copy to obtain meter from [employer] base, he may be reached at [phone]

## 2011-12-02 NOTE — Addendum Note (Signed)
Addended by: Ayesha Mohair on: 12/02/2011 02:33 PM     Modules accepted: Orders

## 2011-12-02 NOTE — Telephone Encounter (Signed)
Pt needs glucometer

## 2011-12-22 ENCOUNTER — Other Ambulatory Visit (INDEPENDENT_AMBULATORY_CARE_PROVIDER_SITE_OTHER): Payer: Self-pay | Admitting: Family Medicine

## 2011-12-22 DIAGNOSIS — E119 Type 2 diabetes mellitus without complications: Secondary | ICD-10-CM

## 2011-12-22 DIAGNOSIS — IMO0002 Reserved for concepts with insufficient information to code with codable children: Secondary | ICD-10-CM

## 2011-12-22 DIAGNOSIS — E785 Hyperlipidemia, unspecified: Secondary | ICD-10-CM

## 2011-12-22 DIAGNOSIS — I10 Essential (primary) hypertension: Secondary | ICD-10-CM

## 2011-12-23 MED ORDER — LISINOPRIL-HYDROCHLOROTHIAZIDE 20-25 MG PO TABS
1.00 | ORAL_TABLET | Freq: Every day | ORAL | Status: DC
Start: 2011-12-22 — End: 2012-03-14

## 2011-12-23 MED ORDER — GLIPIZIDE ER 5 MG PO TB24
5.00 mg | ORAL_TABLET | Freq: Every day | ORAL | Status: DC
Start: 2011-12-22 — End: 2012-03-14

## 2011-12-23 MED ORDER — METFORMIN HCL 1000 MG PO TABS
1000.00 mg | ORAL_TABLET | Freq: Two times a day (BID) | ORAL | Status: DC
Start: 2011-12-22 — End: 2012-01-08

## 2011-12-23 MED ORDER — SIMVASTATIN 20 MG PO TABS
20.00 mg | ORAL_TABLET | Freq: Every evening | ORAL | Status: DC
Start: 2011-12-22 — End: 2012-03-14

## 2012-01-08 ENCOUNTER — Other Ambulatory Visit (INDEPENDENT_AMBULATORY_CARE_PROVIDER_SITE_OTHER): Payer: Self-pay | Admitting: Family Medicine

## 2012-01-08 MED ORDER — METFORMIN HCL 1000 MG PO TABS
1000.00 mg | ORAL_TABLET | Freq: Two times a day (BID) | ORAL | Status: DC
Start: 2012-01-08 — End: 2012-03-14

## 2012-01-08 NOTE — Telephone Encounter (Signed)
[facility] would like to have it changed to a 90 day supply so that he can take full advantage of his plan benefits.

## 2012-02-09 ENCOUNTER — Telehealth (INDEPENDENT_AMBULATORY_CARE_PROVIDER_SITE_OTHER): Payer: Self-pay | Admitting: Family Medicine

## 2012-02-09 NOTE — Telephone Encounter (Signed)
Pt's wife called in requesting a referral for a sleep study for her husband. 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 Her husband is currently in [place]. Please advise.

## 2012-02-09 NOTE — Telephone Encounter (Signed)
Order for polysomnogram placed. Will need an appointment after test done.

## 2012-02-09 NOTE — Telephone Encounter (Signed)
A user error has taken place: encounter opened in error, closed for administrative reasons.

## 2012-02-10 NOTE — Telephone Encounter (Signed)
Pt aware.

## 2012-02-11 ENCOUNTER — Ambulatory Visit: Attending: Family Medicine

## 2012-02-11 DIAGNOSIS — R0609 Other forms of dyspnea: Secondary | ICD-10-CM | POA: Insufficient documentation

## 2012-02-11 DIAGNOSIS — R0989 Other specified symptoms and signs involving the circulatory and respiratory systems: Secondary | ICD-10-CM | POA: Insufficient documentation

## 2012-02-12 ENCOUNTER — Other Ambulatory Visit: Payer: Self-pay

## 2012-02-12 DIAGNOSIS — R0989 Other specified symptoms and signs involving the circulatory and respiratory systems: Secondary | ICD-10-CM | POA: Insufficient documentation

## 2012-02-12 NOTE — Procedures (Signed)
See tech notes section for any additional notes.

## 2012-02-12 NOTE — Miscellaneous (Signed)
LIGHTS OUT AT 10:21 PM. SLEEP STAGES OF W, 1, 2, 3 AND R WERE REACHED. SNORE LEVEL WAS 0-5 AND MOSTLY MODERATE TO SEVERE. AP/HP EVENTS WERE NOTICED, BUT PT. DIDN'T MEET THE CRITERIA OF SPLIT NIGHT TEST. NO PLMS, BRUXISM, PARASOMNIAS AND ARRHYTHMIAS. LIGHTS ON AT 5:46 AM.

## 2012-02-17 ENCOUNTER — Ambulatory Visit

## 2012-02-18 NOTE — Procedures (Signed)
Service Date: 02/11/2012     Patient Type: O     PHYSICIAN/PROVIDER: Barbette Hair Clotine Heiner MD     REFERRING PHYSICIAN: Pennelope Bracken MD     HISTORY:  This is a 56 year old, 6 feet 3 inches, 315-pound male referred to the  sleep laboratory for unspecified reasons.     SUMMARY OF FINDINGS:  Total study time 445 minutes with a sleep time of 391 minutes.  Sleep  efficiency 88%.  Latency to stage 1 sleep 6 minutes.  The patient achieved  stage 1, 2, 3 and REM sleep.  He slept in lateral and supine positions.   There were 86 arousals with an arousal index of 13 events per hour.  There  were 30 obstructive apneas and 19 hypopneas and apnea-hypopnea index of 8  events per hour.  Disturbed-breathing events were noted in REM and non-REM  sleep, they were noted lateral sleep.  Minimum oxygen saturation while  sleeping was recorded at 86% with a mean saturation of 95%.  Average heart  rate while sleeping was 72 beats per minute.  There were no statistically  significant periodic leg movements of sleep noted.  Snoring was noted.  The  patient was noted to be mildly hypertensive in the p.m. with a blood  pressure 153/63 and normotensive in the a.m. with a blood pressure 114/76.     INTERPRETATION AND RECOMMENDATIONS:  Overnight polysomnogram displays evidence of a mild degree of sleep apnea.   If clinically indicated, a repeat study could be performed with CPAP  titration.  If the patient is intolerant of nasal CPAP, other therapeutic  measures to consider would include ENT and/or dental evaluation for a  dental orthotic.     If the operation of a motor vehicle in a safe manner is in question, would  impose driving restrictions until the patient's sleep apnea/hypersomnolence  has been corrected, but if clinically indicated, would initiate weight loss  and evaluation of thyroid function studies.  Further followup may be  warranted regarding the patient's hypertension.           D:  02/18/2012 08:23 AM by Dr. Nat Math,  MD [phone])  T:  02/18/2012 10:55 AM by [id number]      Everlean Cherry: [id number]) (Doc ID: [id number])

## 2012-02-22 ENCOUNTER — Encounter (INDEPENDENT_AMBULATORY_CARE_PROVIDER_SITE_OTHER): Payer: Self-pay | Admitting: Family Medicine

## 2012-02-22 ENCOUNTER — Ambulatory Visit (INDEPENDENT_AMBULATORY_CARE_PROVIDER_SITE_OTHER): Admitting: Family Medicine

## 2012-02-22 VITALS — BP 134/87 | HR 105 | Ht 75.0 in | Wt 321.2 lb

## 2012-02-22 DIAGNOSIS — E785 Hyperlipidemia, unspecified: Secondary | ICD-10-CM

## 2012-02-22 DIAGNOSIS — G4733 Obstructive sleep apnea (adult) (pediatric): Secondary | ICD-10-CM

## 2012-02-22 DIAGNOSIS — N200 Calculus of kidney: Secondary | ICD-10-CM

## 2012-02-22 DIAGNOSIS — I10 Essential (primary) hypertension: Secondary | ICD-10-CM

## 2012-02-22 DIAGNOSIS — E119 Type 2 diabetes mellitus without complications: Secondary | ICD-10-CM

## 2012-02-22 NOTE — Progress Notes (Signed)
Subjective:       Patient ID: Francis Trevino is a 56 y.o. male.    HPI  . Mild obstructive sleep apnea      Noted mild degree of sleep apnea on polysomnogram 02/18/12 . Never had CPAP  titration  .       Marland Kitchen Other dyspnea and respiratory abnormality 02/12/2012   . Hyperlipidemia      Lab Results   Component Value Date    CHOL 164 11/02/2011     Lab Results   Component Value Date    HDL 34* 11/02/2011     Lab Results   Component Value Date    LDL 74 11/02/2011     Lab Results   Component Value Date    TRIG 279* 11/02/2011   .     Marland Kitchen Type II diabetes mellitus      . Will refer to Diabetic Educator and will start Metformin  daily 1000mg  BID . Continue ASA 81mg  daily, and Lisinopril for renoprotection and HTN.   Lab Results   Component Value Date    HGBA1C 8.6* 11/02/2011      . Adrenal adenoma      .Pending Endocrinology consult for further evaluation and treatment. Has not scheduled an appt yet.         . Multiple joint pain    . History of gout    . Benign essential hypertension      . Compliant with Lisinopril-HCTZ 20-25mg  daily. Home BP readings 125/86-90.     BP Readings from Last 3 Encounters:   02/22/12 134/87   11/12/11 123/84   10/29/11 133/76      . Recurrent nephrolithiasis 04/09/2010     Stone analysis showed 80% uric acid, 20% calcium oxalate. Seen by Urologist Arther Dames, MD on 05/18/11, given Toradol..             The following portions of the patient's history were reviewed and updated as appropriate:   He  has a past medical history of Pain in Achilles tendon (2007); Kidney calculi (05/2010); Hypertension; History of gout; and Calcium oxalate kidney stones (05/2010).  He  does not have any pertinent problems on file.  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  His family history includes Alzheimer's disease in his mother; Diabetes in his father; and Heart disease in his father.  There is no history of Hypertension and  Hyperlipidemia.  He  reports that he quit smoking about 15 years ago. His smoking use included Cigarettes. He has a 30 pack-year smoking history. He does not have any smokeless tobacco history on file. He reports that he drinks alcohol. His drug history not on file.  He has a current medication list which includes the following prescription(s): diclofenac sodium, glipizide xl, glucose monitoring kit, ketoconazole, lisinopril-hydrochlorothiazide, metformin, minocycline, naproxen, naproxen sodium, and simvastatin.  Current Outpatient Prescriptions on File Prior to Visit   Medication Sig Dispense Refill   . Diclofenac Sodium (VOLTAREN) 1 % GEL Apply 2 g topically 4 (four) times daily.  100 g  3   . glipiZIDE XL (GLIPIZIDE XL) 5 MG 24 hr tablet Take 1 tablet (5 mg total) by mouth daily. With dinner  90 tablet  0   . glucose monitoring kit (FREESTYLE) monitoring kit use as instructed; please dispense with test strips  1 each  1   . ketoconazole (NIZORAL) 2 % cream Apply topically daily. Until  1 week after resolution of rash  30 g  1   . lisinopril-hydrochlorothiazide (PRINZIDE,ZESTORETIC) 20-25 MG per tablet Take 1 tablet by mouth daily.  90 tablet  3   . metFORMIN (GLUCOPHAGE) 1000 MG tablet Take 1 tablet (1,000 mg total) by mouth 2 (two) times daily with meals.  180 tablet  1   . minocycline (MINOCIN,DYNACIN) 100 MG capsule Take 100 mg by mouth 2 (two) times daily.       . naproxen (EC NAPROSYN) 500 MG EC tablet Take 500 mg by mouth 2 (two) times daily with meals.         . Naproxen Sodium (ALEVE PO) Take by mouth as needed.       . simvastatin (ZOCOR) 20 MG tablet Take 1 tablet (20 mg total) by mouth nightly.  90 tablet  0     He is allergic to sulfa antibiotics..    Review of Systems   Respiratory: Negative for chest tightness and wheezing.    Cardiovascular: Negative for chest pain and palpitations.           Objective:    Physical Exam   Constitutional: He appears well-developed and well-nourished.    Cardiovascular: Normal rate, regular rhythm and normal heart sounds.  Exam reveals no gallop and no friction rub.    No murmur heard.  Pulmonary/Chest: No respiratory distress. He has no wheezes. He has no rales. He exhibits no tenderness.           Assessment:       1. Benign essential hypertension     2. Hyperlipidemia  Lipid panel   3. Type II diabetes mellitus  CBC and differential, Comprehensive metabolic panel, Lipid panel, Hemoglobin A1C, Urinalysis, Microalbumin   4. Mild obstructive sleep apnea  Ambulatory referral to Pulmonology   5. Recurrent nephrolithiasis           Plan:       1. Benign essential hypertension  Continue same MED   2. Hyperlipidemia  Lipid panel   3. Type II diabetes mellitus  CBC and differential, Comprehensive metabolic panel, Lipid panel, Hemoglobin A1C, Urinalysis, Microalbumin   4. Mild obstructive sleep apnea  Ambulatory referral to Pulmonology   5. Recurrent nephrolithiasis  Urology follow up

## 2012-02-23 ENCOUNTER — Other Ambulatory Visit (INDEPENDENT_AMBULATORY_CARE_PROVIDER_SITE_OTHER)

## 2012-02-26 ENCOUNTER — Other Ambulatory Visit (INDEPENDENT_AMBULATORY_CARE_PROVIDER_SITE_OTHER): Payer: Self-pay

## 2012-02-26 ENCOUNTER — Ambulatory Visit (INDEPENDENT_AMBULATORY_CARE_PROVIDER_SITE_OTHER)

## 2012-02-26 DIAGNOSIS — I10 Essential (primary) hypertension: Secondary | ICD-10-CM

## 2012-02-26 DIAGNOSIS — E785 Hyperlipidemia, unspecified: Secondary | ICD-10-CM

## 2012-02-26 DIAGNOSIS — E119 Type 2 diabetes mellitus without complications: Secondary | ICD-10-CM

## 2012-02-27 LAB — LIPID PANEL
Cholesterol / HDL Ratio: 3.5 (ref 0.0–5.0)
Cholesterol: 116 MG/DL — ABNORMAL LOW (ref 125–200)
HDL: 33 MG/DL — AB (ref >=40)
LDL Calculated: 49 MG/DL (ref <130)
Non HDL Cholesterol (LDL and VLDL): 83 mg/dL
Triglycerides: 168 MG/DL — ABNORMAL HIGH (ref <150)

## 2012-02-27 LAB — COMPREHENSIVE METABOLIC PANEL
ALT: 16 U/L (ref 9–46)
AST (SGOT): 16 U/L (ref 10–35)
Albumin/Globulin Ratio: 1.8 (ref 1.0–2.5)
Albumin: 4.4 G/DL (ref 3.6–5.1)
Alkaline Phosphatase: 68 U/L (ref 40–115)
BUN: 22 MG/DL (ref 7–25)
Bilirubin, Total: 0.6 MG/DL (ref 0.2–1.2)
CO2: 24 mmol/L (ref 19–30)
Calcium: 9.1 MG/DL (ref 8.6–10.3)
Chloride: 104 mmol/L (ref 98–110)
Creatinine: 1.23 mg/dL (ref 0.70–1.33)
EGFR African American: 76 mL/min/{1.73_m2} (ref >=60)
EGFR: 66 mL/min/{1.73_m2} (ref >=60)
Globulin: 2.4 G/DL (ref 1.9–3.7)
Glucose: 119 MG/DL — ABNORMAL HIGH (ref 65–99)
Potassium: 4.1 mmol/L (ref 3.5–5.3)
Protein, Total: 6.8 G/DL (ref 6.1–8.1)
Sodium: 139 mmol/L (ref 135–146)

## 2012-02-27 LAB — HEMOGLOBIN A1C: Hemoglobin A1C: 6.7 % — ABNORMAL HIGH (ref <5.7)

## 2012-03-14 ENCOUNTER — Ambulatory Visit (INDEPENDENT_AMBULATORY_CARE_PROVIDER_SITE_OTHER): Admitting: Family Medicine

## 2012-03-14 ENCOUNTER — Encounter (INDEPENDENT_AMBULATORY_CARE_PROVIDER_SITE_OTHER): Payer: Self-pay | Admitting: Family Medicine

## 2012-03-14 VITALS — BP 124/77 | HR 104 | Ht 75.0 in | Wt 321.0 lb

## 2012-03-14 DIAGNOSIS — D3500 Benign neoplasm of unspecified adrenal gland: Secondary | ICD-10-CM

## 2012-03-14 DIAGNOSIS — IMO0001 Reserved for inherently not codable concepts without codable children: Secondary | ICD-10-CM

## 2012-03-14 DIAGNOSIS — E119 Type 2 diabetes mellitus without complications: Secondary | ICD-10-CM

## 2012-03-14 DIAGNOSIS — G4733 Obstructive sleep apnea (adult) (pediatric): Secondary | ICD-10-CM

## 2012-03-14 DIAGNOSIS — I10 Essential (primary) hypertension: Secondary | ICD-10-CM

## 2012-03-14 DIAGNOSIS — IMO0002 Reserved for concepts with insufficient information to code with codable children: Secondary | ICD-10-CM

## 2012-03-14 DIAGNOSIS — N200 Calculus of kidney: Secondary | ICD-10-CM

## 2012-03-14 DIAGNOSIS — Z8739 Personal history of other diseases of the musculoskeletal system and connective tissue: Secondary | ICD-10-CM

## 2012-03-14 DIAGNOSIS — E785 Hyperlipidemia, unspecified: Secondary | ICD-10-CM

## 2012-03-14 DIAGNOSIS — Z862 Personal history of diseases of the blood and blood-forming organs and certain disorders involving the immune mechanism: Secondary | ICD-10-CM

## 2012-03-14 DIAGNOSIS — Z139 Encounter for screening, unspecified: Secondary | ICD-10-CM

## 2012-03-14 MED ORDER — SIMVASTATIN 20 MG PO TABS
20.0000 mg | ORAL_TABLET | Freq: Every evening | ORAL | Status: DC
Start: 2012-03-14 — End: 2012-11-17

## 2012-03-14 MED ORDER — LISINOPRIL-HYDROCHLOROTHIAZIDE 20-25 MG PO TABS
1.0000 | ORAL_TABLET | Freq: Every day | ORAL | Status: DC
Start: 2012-03-14 — End: 2012-11-14

## 2012-03-14 MED ORDER — METFORMIN HCL 1000 MG PO TABS
1000.0000 mg | ORAL_TABLET | Freq: Two times a day (BID) | ORAL | Status: DC
Start: 2012-03-14 — End: 2012-10-07

## 2012-03-14 MED ORDER — GLIPIZIDE ER 5 MG PO TB24
5.00 mg | ORAL_TABLET | Freq: Every day | ORAL | Status: AC
Start: 2012-03-14 — End: 2013-03-14

## 2012-03-14 NOTE — Progress Notes (Signed)
Subjective:       Patient ID: Francis Trevino is a 57 y.o. male.    HPI  . Mild obstructive sleep apnea: on CPAP machine     . Hyperlipidemia      Lab Results   Component Value Date    CHOL 116* 02/26/2012    CHOL 164 11/02/2011     Lab Results   Component Value Date    HDL 33* 02/26/2012    HDL 34* 11/02/2011     Lab Results   Component Value Date    LDL 49 02/26/2012    LDL 74 11/02/2011     Lab Results   Component Value Date    TRIG 168* 02/26/2012    TRIG 279* 11/02/2011   . Father had CABG for CAD in his 57s. On Simvastatin 20mg  qHs. Referral to Nutritionist.     . Type II diabetes mellitus      Lab Results   Component Value Date    HGBA1C 6.7* 02/26/2012   .  Lab Results   Component Value Date    MICROALB 0.4 11/12/2011    . Had recent eye exam 10/2011 and no mention on retinopathy. Monofilament foot exam and proprioception normal  on  Metformin  1000mg  BID , Glipizide Xl 5 mg QD . Continue ASA 81mg  daily, and Lisinopril for renoprotection and HTN.     Marland Kitchen Adrenal adenoma      CT abdomen/pelvis without contrast 05/24/11 for right flank pain revealed stable bilateral adrenal adenomas. Denies labile blood pressures, abdominal pain, nausea, vomiting. Will refer to Endocrinology for further evaluation and treatment. Has not scheduled an appt yet.         . Multiple joint pain    . History of gout    . Benign essential hypertension      Diagnosed for more than 10 years. Compliant with Lisinopril-HCTZ 20-25mg  daily. Home BP readings 125/86-90. No regular exercise, diet generally unhealthy. Denies chest pain, SOB, headaches, vision changes. Father had CAD.    BP Readings from Last 3 Encounters:   03/14/12 124/77   02/22/12 134/87   11/12/11 123/84      . Recurrent nephrolithiasis      Stone analysis showed 80% uric acid, 20% calcium oxalate. Seen by Urologist Arther Dames, MD . CT abdomen/pelvis without contrast 05/24/11 showed approximately 8 mm stone involving the proximal right ureter resulting in stable hydronephrosis at  approximately L3 slightly progressed since prior study , where it was at L2-L3. The left kidneys unremarkable..             The following portions of the patient's history were reviewed and updated as appropriate:   He  has a past medical history of Pain in Achilles tendon (2007); Kidney calculi (05/2010); Hypertension; History of gout; and Calcium oxalate kidney stones (05/2010).  He  does not have any pertinent problems on file.  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  His family history includes Alzheimer's disease in his mother; Diabetes in his father; and Heart disease in his father.  There is no history of Hypertension and Hyperlipidemia.  He  reports that he quit smoking about 15 years ago. His smoking use included Cigarettes. He has a 30 pack-year smoking history. He does not have any smokeless tobacco history on file. He reports that he drinks alcohol. His drug history not on file.  He has a current medication list which  includes the following prescription(s): diclofenac sodium, glipizide xl, glucose monitoring kit, ketoconazole, lisinopril-hydrochlorothiazide, metformin, minocycline, naproxen, naproxen sodium, and simvastatin.  Current Outpatient Prescriptions on File Prior to Visit   Medication Sig Dispense Refill   . Diclofenac Sodium (VOLTAREN) 1 % GEL Apply 2 g topically 4 (four) times daily.  100 g  3   . glipiZIDE XL (GLIPIZIDE XL) 5 MG 24 hr tablet Take 1 tablet (5 mg total) by mouth daily. With dinner  90 tablet  0   . glucose monitoring kit (FREESTYLE) monitoring kit use as instructed; please dispense with test strips  1 each  1   . ketoconazole (NIZORAL) 2 % cream Apply topically daily. Until 1 week after resolution of rash  30 g  1   . lisinopril-hydrochlorothiazide (PRINZIDE,ZESTORETIC) 20-25 MG per tablet Take 1 tablet by mouth daily.  90 tablet  3   . metFORMIN (GLUCOPHAGE) 1000 MG tablet Take 1 tablet (1,000 mg  total) by mouth 2 (two) times daily with meals.  180 tablet  1   . minocycline (MINOCIN,DYNACIN) 100 MG capsule Take 100 mg by mouth 2 (two) times daily.       . naproxen (EC NAPROSYN) 500 MG EC tablet Take 500 mg by mouth 2 (two) times daily with meals.         . Naproxen Sodium (ALEVE PO) Take by mouth as needed.       . simvastatin (ZOCOR) 20 MG tablet Take 1 tablet (20 mg total) by mouth nightly.  90 tablet  0     He is allergic to sulfa antibiotics..    Review of Systems   Constitutional: Negative for appetite change and unexpected weight change.   Respiratory: Negative for chest tightness and wheezing.    Cardiovascular: Negative for chest pain and palpitations.           Objective:    Physical Exam   Constitutional: He appears well-developed and well-nourished. No distress.   Cardiovascular: Normal rate and regular rhythm.  Exam reveals no gallop and no friction rub.    No murmur heard.  Pulmonary/Chest: No respiratory distress. He has no wheezes. He has no rales. He exhibits no tenderness.   Skin: He is not diaphoretic.           Assessment:       1. Recurrent nephrolithiasis     2. Benign essential hypertension  lisinopril-hydrochlorothiazide (PRINZIDE,ZESTORETIC) 20-25 MG per tablet, Hemoglobin A1C, CBC and differential, Comprehensive metabolic panel   3. History of gout     4. Adrenal adenoma  Ambulatory referral to Endocrinology   5. Hyperlipidemia  simvastatin (ZOCOR) 20 MG tablet, Lipid panel   6. Type II diabetes mellitus  Hemoglobin A1C, CBC and differential, Comprehensive metabolic panel, Lipid panel, TSH, Urinalysis, Urinalysis, Microalbumin   7. Mild obstructive sleep apnea     8. Newly diagnosed diabetes  metFORMIN (GLUCOPHAGE) 1000 MG tablet, glipiZIDE XL (GLIPIZIDE XL) 5 MG 24 hr tablet   9. Diabetes mellitus type 2, uncontrolled  metFORMIN (GLUCOPHAGE) 1000 MG tablet         Plan:       1. Recurrent nephrolithiasis     2. Benign essential hypertension  lisinopril-hydrochlorothiazide  (PRINZIDE,ZESTORETIC) 20-25 MG per tablet, Hemoglobin A1C, CBC and differential, Comprehensive metabolic panel   3. History of gout     4. Adrenal adenoma  Ambulatory referral to Endocrinology   5. Hyperlipidemia  simvastatin (ZOCOR) 20 MG tablet, Lipid panel   6. Type II diabetes  mellitus  Hemoglobin A1C, CBC and differential, Comprehensive metabolic panel, Lipid panel, TSH, Urinalysis, Urinalysis, Microalbumin   7. Mild obstructive sleep apnea     8. Newly diagnosed diabetes  metFORMIN (GLUCOPHAGE) 1000 MG tablet, glipiZIDE XL (GLIPIZIDE XL) 5 MG 24 hr tablet   9. Diabetes mellitus type 2, uncontrolled  metFORMIN (GLUCOPHAGE) 1000 MG tablet

## 2012-03-14 NOTE — Addendum Note (Signed)
Addended by: Jeral Pinch on: 03/14/2012 09:40 AM     Modules accepted: Orders

## 2012-03-23 ENCOUNTER — Encounter (INDEPENDENT_AMBULATORY_CARE_PROVIDER_SITE_OTHER): Payer: Self-pay | Admitting: Endocrinology, Diabetes and Metabolism

## 2012-03-23 ENCOUNTER — Ambulatory Visit (INDEPENDENT_AMBULATORY_CARE_PROVIDER_SITE_OTHER): Admitting: Endocrinology, Diabetes and Metabolism

## 2012-03-23 VITALS — BP 117/62 | HR 98 | Ht 75.0 in | Wt 330.4 lb

## 2012-03-23 DIAGNOSIS — E278 Other specified disorders of adrenal gland: Secondary | ICD-10-CM

## 2012-03-23 NOTE — Patient Instructions (Addendum)
Please note that in the future if you need prescription refills, plan ahead and call our clinic during normal clinic hours only to ensure these get done in a timely fashion.  If you are overdue for your follow up appointment, we may ask that you come in for a visit before refilling the medications.  Thanks for your understanding.    Be sure you arrive to your appointments on time in the future.  If you are late by 15 minutes or more, we may ask you reschedule because it is discourteous to patients on our schedule who do come in on time to ask them to wait because you were late.    Recommended follow-up from today's visit: pending the results of your upcoming tests.      SALIVARY CORTISOL COLLECTION:    With any medical test, it is important that other factors do not interfere with your test result.  Please read and follow these instructions carefully:    Saliva contains cortisol, derived from blood.  Measurement of cortisol in saliva provides an estimate of the form that is most active in the body.  Salivary collection can be done at home and avoids the need for blood sampling with its inherent inconvenience.  High levels of cortisol, such as those found in patients with Cushing's syndrome, are easiest to detect in a midnight saliva sample.    Collecting the specimen:    1. Do not eat, drink, or smoke for at least 30 minutes before the saliva collection    2. At 11 pm or midnight, open the salivette tube and remove the cotton swab    3. Place the cotton swab between your cheek and gum for approximately 3 minutes (until saturated).    4. Replace the cotton swab in the small tube, then place it in the large tube, and close the top until it clicks shut.    5. Write the time of collection on the tube.    6. Place the tube in the refrigerator (not the freezer).    Return your specimens back to the lab upon completion (usually 2 separate samples to be collected on 2 separate nights)

## 2012-03-23 NOTE — Progress Notes (Addendum)
Subjective:       Patient ID: Francis Trevino is a 57 y.o. male.    HPI    Francis Trevino is being referred by Dr. Janeece Riggers for consultation regarding adrenal incidentaloma.    Francis Trevino is a 57 y.o. -year-old male who was discovered to have an adrenal mass in 2009.  The location was bilateral.  This was discovered in the context of coincidental finding on abdominal imaging done for other reasons.  Prior hormonal workup included: none    In terms of associated symptoms:    Cushings: the patient has not had weight gain, has not had easy bruising, has had hypertension, has had glucose intolerance, has not had virilization, has not had proximal muscle weakness, has had fatigue, has not had depression, has had sleep disturbance- on CPAP machine for sleep apnea, has not had fracture with minimal trauma, has not had recurrent infections, has had nephrolithiasis-had 2 episodes in past- 1 from gout stone, has not had osteoporosis, has not had violaceous striae, and has not had emotional lability.    Pheochromocytoma: the patient denies headaches, denies weight loss, denies nausea, complains of hypertension (15% w/o), denies orthostasis, denies anxiety attacks, complains of sweating, denies palpitations, denies pheochromocytoma/paraganglioma family history.    Aldosteronism: the patient has not had drug-resistant hypertension, has not had fluid retention, has not had muscle cramping/weakness, has not had polydipsia, has not had polyuria, has had nocturia, has not had family history of early onset HTN or CVA at young age    Malignancy: the patient denies personal history of cancer,  reports smoking history- quit in 1998, denies fever, and denies being up to date on cancer screening (colon- needs, prostate- is up to date)    Had an episode of low blood pressure few years ago during a knee operation.  But has had 2 procedures since then under general anesthesia without issues.  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      Past Medical History   Diagnosis Date   . Pain in Achilles tendon 2007   . Kidney calculi 05/2010     s/p lithotripsy   . Hypertension    . History of gout    . Calcium oxalate kidney stones 05/2010     s/p lithotripsy   . Arthritis      Past Surgical History   Procedure Date   . Knee arthroscopy 01/2008     left knee   . Iris nevus 06/1991     removed   . Achilles tendon surgery      right     Family History   Problem Relation Age of Onset   . Alzheimer's disease Mother    . Cancer Mother      skin   . Heart disease Father      x5 Bypasses   . Diabetes Father    . Cancer Father      prostate, skin   . Hyperlipidemia Neg Hx    . Hypertension Brother    . Diabetes Brother      History   Substance Use Topics   . Smoking status: Former Smoker -- 1.5 packs/day for 20 years     Types: Cigarettes     Quit date: 03/20/1996   . Smokeless tobacco: Not on file   .  Alcohol Use: Yes      Comment: Infrequent       Review of Systems   Constitutional: Positive for fatigue. Negative for fever and unexpected weight change.   HENT: Negative for trouble swallowing.    Eyes: Negative for visual disturbance.   Respiratory: Negative for shortness of breath.    Cardiovascular: Negative for chest pain and palpitations.   Gastrointestinal: Negative for nausea, vomiting and abdominal pain.   Genitourinary: Negative for dysuria and frequency.   Musculoskeletal: Negative for myalgias.   Neurological: Negative for dizziness.   Psychiatric/Behavioral: Negative for dysphoric mood.           Objective:    Physical Exam   Constitutional: He appears well-developed.        Obese   HENT:   Mouth/Throat: Oropharynx is clear and moist and mucous membranes are normal. Normal dentition.   Eyes: EOM are normal. No scleral icterus. Right pupil is not reactive. Left pupil is reactive.        No lid lag.  Left eye  with iris defect.   Neck: No tracheal deviation present. No mass and no thyromegaly present.        No thyroid bruits.   Cardiovascular: Normal rate, regular rhythm and normal heart sounds.    No murmur heard.  Pulses:       Carotid pulses are 2+ on the right side, and 2+ on the left side.       No carotid bruits.   Pulmonary/Chest: Effort normal and breath sounds normal.   Abdominal: There is no hepatosplenomegaly or hepatomegaly. There is no tenderness.   Musculoskeletal:        Right shoulder: He exhibits normal strength.        5/5 proximal muscle strength   Lymphadenopathy:     He has no cervical adenopathy.        Right: No supraclavicular adenopathy present.        Left: No supraclavicular adenopathy present.   Neurological: He displays no tremor.   Reflex Scores:       Patellar reflexes are 2+ on the right side and 2+ on the left side.       Cranial nerves 3,4, and 6 intact.   Skin:        Inspection and palpation shows normal without dryness, no hyperpigmentation, bruising, striae, or thinning; no dorsocervical or supraclavicular fat pads.   Psychiatric: He has a normal mood and affect. Judgment normal.         Prior labs: 02/26/12: A1c 6.7, Tchol 116, TG 68, HDL 33, LDL 49, Cr 1.23, nl LFTs, Ca 9.1, Na 139, K 4.1; ADDENDUM: 03/28/12: 8 AM: plasma frac metanephrines 83 (<205), PRA 15.35 (0.25-5.82), Aldosterone 2 ng/dl- PRA elevated likely on context of HCTZ and ACE inhibitor; salivary cortisols 0.07, 0.13- not concerning for Cushings.  No concerning evidence for hyperfunctionality.  Plan f/u in 1 year given duration of surveillance so far.    Prior radiology: [place] CT abd/pelvis 01/2008: Bilateral low density adrenal gland nodules are seen, largest on the right measuring 2 cm. Bilateral low density adrenal gland nodules, probable adenomas. Bilateral nonobstructing renal calculi. 05/2011: [place] CT abd/pelvis adrenal adenomas unchanged.    [facility] 03/24/12: Compared to [facility] imaging from  01/2008: bilateral adrenal adenomas noted with right side largest at 2 cm (unchanged from 2009) low attenuation.          Assessment:       57 y.o. -year-old male  with finding of adrenal incidentaloma.  80% of adrenal lesions are nonfunctional, 5% are pheos, 1% are aldosteronomas, <5% are malignant, 2.5% represent metastatic disease, and small percentage could be other benign lesions (i.e. ganglioneuromas, myelolipomas, benign cysts).  Surgery for adrenal lesion indicated if >4 cm, indeterminate/malignant radiographic characteristics, or hormonally active.  Risk for ACC based on size: <4 cm=2%, 4.1-6 cm=6%, >6 cm=25%.  If no surgical resection required, repeat radiographic evaluation at 3-6 months, then annually for 1-2 years.  The risk of mass enlarging during 1,2, & 5 years is 6%, 14%, and 29% respectively.  If it grows more than 1 cm or becomes hormonally active, recommend surgery.  Hormonal evaluation is repeated annually for 5 years; the risk of mass becoming hormonally active at 1,2, & 5 years is 17%, 29%, and 47%, respectively.     I reviewed and summarized the patient's previous medical records today, including previous relevant laboratory results and imaging reports.  In summary, his prior labs do not suggest renal dysfunction or electrolyte abnormality that may be seen in hyperfunctioning adrenal nodules.  His imaging confirms bilateral adrenal lesions that do not appear to be changing in size over time.      Plan:       1.  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  Will get a new baseline adrenal CT with [facility] adrenal protocol CT.  If workup nonrevealing, then may defer on further imaging tests given nearly 5 years of surveillance; could consider repeat hormonal evaluation in future if symptoms worsen.    2. Diabetes (stable): managed by PCP and under good control with metformin 1000 mg bid (will need to hold a few days before CT), glipizide XL 5 mg    3. Hypertension (stable): Controlled on Lisinopril/HCTZ 20/25 mg daily    4. Hyperlipidemia (stable): controlled on Zocor 20 mg daily.

## 2012-03-25 ENCOUNTER — Ambulatory Visit (INDEPENDENT_AMBULATORY_CARE_PROVIDER_SITE_OTHER)

## 2012-03-25 DIAGNOSIS — E785 Hyperlipidemia, unspecified: Secondary | ICD-10-CM

## 2012-03-25 DIAGNOSIS — E119 Type 2 diabetes mellitus without complications: Secondary | ICD-10-CM

## 2012-03-25 NOTE — Progress Notes (Signed)
Patient in the office for blood draw only.

## 2012-03-28 LAB — METANEPHRINES,FRAC,LC-MS-MS
Metanephrine, Free: <25 pg/mL (ref <=57)
Metanephrines, Total: 83 pg/mL (ref <=205)
Normetanephrine, Free: 83 pg/mL (ref <=148)

## 2012-03-29 ENCOUNTER — Ambulatory Visit (INDEPENDENT_AMBULATORY_CARE_PROVIDER_SITE_OTHER): Admitting: Specialist

## 2012-03-29 LAB — PLASMA RENIN ACTIVITY: PRA: 15.35 ng/mL/h — ABNORMAL HIGH (ref 0.25–5.82)

## 2012-04-07 ENCOUNTER — Encounter (INDEPENDENT_AMBULATORY_CARE_PROVIDER_SITE_OTHER): Payer: Self-pay | Admitting: Family Medicine

## 2012-04-07 ENCOUNTER — Telehealth (INDEPENDENT_AMBULATORY_CARE_PROVIDER_SITE_OTHER): Payer: Self-pay

## 2012-04-07 NOTE — Telephone Encounter (Signed)
Pt is going out of town and would like to know about his results and if he would be starting a new prescription. [phone]

## 2012-04-13 ENCOUNTER — Encounter (INDEPENDENT_AMBULATORY_CARE_PROVIDER_SITE_OTHER): Payer: Self-pay | Admitting: Endocrinology, Diabetes and Metabolism

## 2012-04-13 LAB — SALIVA CORTISOL
Cortisol, Saliva: 0.13 ug/dL
Cortisol, Saliva: 0.07 ug/dL

## 2012-04-13 LAB — ALDOSTERONE: Aldosterone, LC/MS/MS: 2 ng/dL

## 2012-04-14 ENCOUNTER — Telehealth (INDEPENDENT_AMBULATORY_CARE_PROVIDER_SITE_OTHER): Payer: Self-pay | Admitting: Family Medicine

## 2012-04-14 ENCOUNTER — Other Ambulatory Visit (INDEPENDENT_AMBULATORY_CARE_PROVIDER_SITE_OTHER): Payer: Self-pay | Admitting: Endocrinology, Diabetes and Metabolism

## 2012-04-14 NOTE — Telephone Encounter (Signed)
Patient called stating that he needs a letter for his employer stating that he is a diabetic but is non insulin dependant and that his diabetes is well controlled.  The letter needs to be faxed to [phone].  Thank you.

## 2012-04-18 ENCOUNTER — Encounter (INDEPENDENT_AMBULATORY_CARE_PROVIDER_SITE_OTHER): Payer: Self-pay | Admitting: Family Medicine

## 2012-04-18 NOTE — Telephone Encounter (Signed)
Error, patient had HbA1C in Dec2013, letter to be picked up by patient

## 2012-04-18 NOTE — Telephone Encounter (Signed)
Last HbA1C was on done last year, needs to follo wup in the office

## 2012-04-19 NOTE — Telephone Encounter (Signed)
Letter faxed as requested and will be at the front desk for patient to pick up.

## 2012-10-07 ENCOUNTER — Other Ambulatory Visit (INDEPENDENT_AMBULATORY_CARE_PROVIDER_SITE_OTHER): Payer: Self-pay | Admitting: Family Medicine

## 2012-10-07 ENCOUNTER — Encounter (INDEPENDENT_AMBULATORY_CARE_PROVIDER_SITE_OTHER): Payer: Self-pay

## 2012-10-07 MED ORDER — METFORMIN HCL 1000 MG PO TABS
1000.00 mg | ORAL_TABLET | Freq: Two times a day (BID) | ORAL | Status: DC
Start: 2012-10-07 — End: 2012-12-18

## 2012-10-07 NOTE — Telephone Encounter (Signed)
Message sent via MyChart.

## 2012-11-08 ENCOUNTER — Other Ambulatory Visit (INDEPENDENT_AMBULATORY_CARE_PROVIDER_SITE_OTHER): Payer: Self-pay | Admitting: Family Medicine

## 2012-11-08 ENCOUNTER — Telehealth (INDEPENDENT_AMBULATORY_CARE_PROVIDER_SITE_OTHER): Payer: Self-pay

## 2012-11-08 DIAGNOSIS — E785 Hyperlipidemia, unspecified: Secondary | ICD-10-CM

## 2012-11-08 DIAGNOSIS — Z8739 Personal history of other diseases of the musculoskeletal system and connective tissue: Secondary | ICD-10-CM

## 2012-11-08 DIAGNOSIS — I10 Essential (primary) hypertension: Secondary | ICD-10-CM

## 2012-11-08 DIAGNOSIS — E119 Type 2 diabetes mellitus without complications: Secondary | ICD-10-CM

## 2012-11-08 NOTE — Telephone Encounter (Signed)
Patient wants to go to [facility] on Saturday before his appointment next week.  What labs does he need to have drawn?  He would like the order mailed to his house.

## 2012-11-14 ENCOUNTER — Ambulatory Visit (INDEPENDENT_AMBULATORY_CARE_PROVIDER_SITE_OTHER)

## 2012-11-14 ENCOUNTER — Ambulatory Visit (INDEPENDENT_AMBULATORY_CARE_PROVIDER_SITE_OTHER): Admitting: Family Medicine

## 2012-11-14 ENCOUNTER — Encounter (INDEPENDENT_AMBULATORY_CARE_PROVIDER_SITE_OTHER): Payer: Self-pay | Admitting: Family Medicine

## 2012-11-14 VITALS — BP 117/68 | HR 116 | Temp 97.8°F | Ht 75.0 in | Wt 313.8 lb

## 2012-11-14 DIAGNOSIS — E119 Type 2 diabetes mellitus without complications: Secondary | ICD-10-CM

## 2012-11-14 DIAGNOSIS — I10 Essential (primary) hypertension: Secondary | ICD-10-CM

## 2012-11-14 DIAGNOSIS — E785 Hyperlipidemia, unspecified: Secondary | ICD-10-CM

## 2012-11-14 DIAGNOSIS — Z862 Personal history of diseases of the blood and blood-forming organs and certain disorders involving the immune mechanism: Secondary | ICD-10-CM

## 2012-11-14 DIAGNOSIS — G4733 Obstructive sleep apnea (adult) (pediatric): Secondary | ICD-10-CM

## 2012-11-14 DIAGNOSIS — Z23 Encounter for immunization: Secondary | ICD-10-CM

## 2012-11-14 MED ORDER — LISINOPRIL 20 MG PO TABS
20.0000 mg | ORAL_TABLET | Freq: Every day | ORAL | Status: DC
Start: 2012-11-14 — End: 2013-04-06

## 2012-11-14 NOTE — Progress Notes (Signed)
Has the patient sought any care outside of the [facility]? YES    -Refer to care team-   Or  List Specialists:  [facility]  PULMONOLOGY - CPAP

## 2012-11-14 NOTE — Progress Notes (Signed)
Subjective:       Patient ID: Francis Trevino is a 57 y.o. male.    HPI  Here for 94-month follow-up of multiple medical problems.  Problem   Mild Obstructive Sleep Apnea    Noted mild degree of sleep apnea on polysomnogram 02/18/12 by Dr. Romie Jumper. Compliant with CPAP. Has a Agricultural consultant and has been travelling to [place], [place].     Hyperlipidemia    Fasting lipids 11/02/11 showed chol 164, trig 279, HDL 34, LDL 74, non-HDL 130. Not on statins. Father had CABG for CAD in his 18s. Started on Simvastatin 20mg  qHs, compliant with medication. Eating healthier, walking more. Denies chest pain, SOB, myalgias.  Lab Results   Component Value Date    CHOL 116* 02/26/2012    CHOL 164 11/02/2011     Lab Results   Component Value Date    HDL 33* 02/26/2012    HDL 34* 11/02/2011     Lab Results   Component Value Date    LDL 49 02/26/2012    LDL 74 11/02/2011     Lab Results   Component Value Date    TRIG 168* 02/26/2012    TRIG 279* 11/02/2011        Type II Diabetes Mellitus    Diagnosed in 10/2011. HgA1c 8.6, fasting glucose 188 on 11/02/11. No prior diagnosis of DM. Crea 1.06, eGFR 79. Urine microalbumin 4.9 on 11/12/11. Had eye exam 10/2011 and no mention on retinopathy, scheduled for 11/2012. Monofilament foot exam and proprioception normal 11/14/2012, visual inspection showed nail deformities, dry skin and deformity R big toe. Has a Podiatrist but has not seen in more than a year. Was referred to see Diabetic Educator but has not seen. Compliant with Metformin 1000mg  BID, Glipizide XL 5mg  daily. Continue ASA 81mg  daily, and Lisinopril for renoprotection and HTN.  Lab Results   Component Value Date    HGBA1C 6.7* 02/26/2012        Benign Essential Hypertension    Diagnosed for more than 10 years. Compliant with Lisinopril-HCTZ 20-25mg  daily. Home BP readings 125/86-90. No regular exercise, diet generally unhealthy. Denies chest pain, SOB, lightheadedness, headaches, vision changes. Father had CAD. Lost about 16 lbs in 6  months. No home BP readings. C/o urinary frequency.           The following portions of the patient's history were reviewed and updated as appropriate: allergies, current medications, past family history, past medical history, past social history, past surgical history and problem list.    Review of Systems   Constitutional: Negative for fatigue.   Eyes: Negative for visual disturbance.   Respiratory: Negative for chest tightness and shortness of breath.    Cardiovascular: Negative for chest pain, palpitations and leg swelling.   Genitourinary: Positive for frequency. Negative for difficulty urinating.   Neurological: Negative for light-headedness and numbness.   Psychiatric/Behavioral: Negative for sleep disturbance.           Objective:    Physical Exam   Vitals reviewed.  Constitutional: He is oriented to person, place, and time. No distress.   Cardiovascular: Normal rate, regular rhythm, normal heart sounds and intact distal pulses.    No murmur heard.  Pulmonary/Chest: Effort normal and breath sounds normal. No respiratory distress. He has no wheezes. He has no rales.   Neurological: He is alert and oriented to person, place, and time.        Sensory exam of the foot is normal, tested with the  monofilament. Good peripheral pulses. Intact proprioception both feet.    Dry skin both feet with toenail deformities and R big toe deformity     Skin: Skin is warm. He is not diaphoretic.   Psychiatric: He has a normal mood and affect. His behavior is normal. Judgment normal.     BP 117/68  Pulse 116  Temp 97.8 F (36.6 C) (Oral)  Ht 1.905 m (6\' 3" )  Wt 142.339 kg (313 lb 12.8 oz)  BMI 39.22 kg/m2      Assessment:       1. Benign essential hypertension  lisinopril (PRINIVIL,ZESTRIL) 20 MG tablet   2. Type II diabetes mellitus  Flu vaccine greater than or equal to 3yo with preservative IM    Pneumococcal polysaccharide vaccine 23-valent greater than or equal to 2yo subcutaneous/IM   3. Hyperlipidemia     4. Mild  obstructive sleep apnea     5. Need for influenza vaccination  Flu vaccine greater than or equal to 3yo with preservative IM   6. Need for pneumococcal vaccination  Pneumococcal polysaccharide vaccine 23-valent greater than or equal to 2yo subcutaneous/IM         Plan:       Benign essential HTN - Controlled but low BP. Goal BP < 140/90. Decrease Lisinopril-HCTZ 20-25mg  to Lisinopril 20mg  daily. Recheck BP in 1-3 months.    Type II DM - Goal HgA1c <6.5. Due HgA1c, urine microalbumin, eye exam. Continue ASA 81mg  daily, Lisinopril 20mg  daily for HTN and renoprotection, Metformin 1000mg  BID and Glipizide XL 5mg  daily pending results of HgA1c. LDL goal < 100.    Hyperlipidemia - LDL at goal < 100. HDL low, triglycerides elevated. Due for fasting lipids. Continue Simvastatin 20mg  qHs and adjust as needed.    Mild sleep apnea - Continue to use CPAP. Follow-up with Pulmonologist.    Need for influenza and pneumococcal vaccines - Flu vaccine and Pneumovax given today. Counseled on risks and benefits of the vaccine and patient appears to understand.          Return in about 3 months (around 02/13/2013) for HTN, DM, hyperlipidemia with fasting labs 1 week prior.

## 2012-11-14 NOTE — Patient Instructions (Signed)
Please note that in the future if you need prescription refills, plan ahead and call our clinic during normal clinic hours only to ensure these get done in a timely fashion. If you are overdue for your follow up appointment, we may ask that you come in for a visit before refilling the medications. Thank you for your understanding.     Be sure you arrive to your appointments on time in the future. If you are late by 10 minutes or more, we will ask you reschedule.    For medical questions or concerns after hours, please call [phone].  _________________________________________

## 2012-11-15 LAB — COMPREHENSIVE METABOLIC PANEL
ALT: 16 U/L (ref 9–46)
AST (SGOT): 12 U/L (ref 10–35)
Albumin/Globulin Ratio: 1.8 (ref 1.0–2.5)
Albumin: 4.3 G/DL (ref 3.6–5.1)
Alkaline Phosphatase: 68 U/L (ref 40–115)
BUN: 19 MG/DL (ref 7–25)
Bilirubin, Total: 0.4 MG/DL (ref 0.2–1.2)
CO2: 24 mmol/L (ref 19–30)
Calcium: 9.2 MG/DL (ref 8.6–10.3)
Chloride: 103 mmol/L (ref 98–110)
Creatinine: 1.11 mg/dL (ref 0.70–1.33)
EGFR African American: 86 mL/min/{1.73_m2} (ref >=60)
EGFR: 74 mL/min/{1.73_m2} (ref >=60)
Globulin: 2.4 G/DL (ref 1.9–3.7)
Glucose: 110 MG/DL — ABNORMAL HIGH (ref 65–99)
Potassium: 4.3 mmol/L (ref 3.5–5.3)
Protein, Total: 6.7 G/DL (ref 6.1–8.1)
Sodium: 138 mmol/L (ref 135–146)

## 2012-11-15 LAB — LIPID PANEL
Cholesterol / HDL Ratio: 4.2 (ref 0.0–5.0)
Cholesterol: 133 MG/DL (ref 125–200)
HDL: 32 mg/dL — AB (ref >=40)
LDL Calculated: 69 mg/dL (ref <130)
Non HDL Cholesterol (LDL and VLDL): 101 mg/dL
Triglycerides: 159 MG/DL — ABNORMAL HIGH (ref <150)

## 2012-11-15 LAB — HEMOGLOBIN A1C: Hemoglobin A1C: 6.3 % — ABNORMAL HIGH (ref <5.7)

## 2012-11-15 LAB — URIC ACID: Uric Acid: 10.3 MG/DL — ABNORMAL HIGH (ref 4.0–8.0)

## 2012-11-15 LAB — MICROALBUMIN, RANDOM URINE
Creatinine, UR: 108 mg/dL (ref 20–370)
Microalbumin MCG/MG Creatinine: 8.3 mcg/mg crea
Microalbumin: 0.9 mg/dL

## 2012-11-17 ENCOUNTER — Telehealth (INDEPENDENT_AMBULATORY_CARE_PROVIDER_SITE_OTHER): Payer: Self-pay | Admitting: Family Medicine

## 2012-11-17 ENCOUNTER — Encounter (INDEPENDENT_AMBULATORY_CARE_PROVIDER_SITE_OTHER): Payer: Self-pay | Admitting: Family Medicine

## 2012-11-17 ENCOUNTER — Other Ambulatory Visit (INDEPENDENT_AMBULATORY_CARE_PROVIDER_SITE_OTHER): Payer: Self-pay | Admitting: Family Medicine

## 2012-11-17 DIAGNOSIS — E785 Hyperlipidemia, unspecified: Secondary | ICD-10-CM

## 2012-11-17 DIAGNOSIS — E790 Hyperuricemia without signs of inflammatory arthritis and tophaceous disease: Secondary | ICD-10-CM | POA: Insufficient documentation

## 2012-11-17 MED ORDER — SIMVASTATIN 20 MG PO TABS
20.0000 mg | ORAL_TABLET | Freq: Every evening | ORAL | Status: DC
Start: 2012-11-17 — End: 2013-03-06

## 2012-11-17 NOTE — Telephone Encounter (Signed)
Pt is anxious to get his test results. Please advise.

## 2012-11-17 NOTE — Telephone Encounter (Signed)
Patient is on the road and unable to come to the clinic for the next month.  He is requesting a refill on his Zocor until he can get in for a change in meds.

## 2012-11-18 ENCOUNTER — Encounter (INDEPENDENT_AMBULATORY_CARE_PROVIDER_SITE_OTHER): Payer: Self-pay

## 2012-11-18 NOTE — Telephone Encounter (Signed)
Patient notified via My Chart

## 2012-11-22 ENCOUNTER — Encounter (INDEPENDENT_AMBULATORY_CARE_PROVIDER_SITE_OTHER): Payer: Self-pay | Admitting: Family Medicine

## 2012-11-25 ENCOUNTER — Encounter (INDEPENDENT_AMBULATORY_CARE_PROVIDER_SITE_OTHER): Payer: Self-pay | Admitting: Family Medicine

## 2012-11-25 ENCOUNTER — Ambulatory Visit (INDEPENDENT_AMBULATORY_CARE_PROVIDER_SITE_OTHER): Admitting: Family Medicine

## 2012-11-25 ENCOUNTER — Ambulatory Visit
Admission: RE | Admit: 2012-11-25 | Discharge: 2012-11-25 | Disposition: A | Discharge status: Home / Self Care | Source: Ambulatory Visit | Attending: Family Medicine | Admitting: Family Medicine

## 2012-11-25 VITALS — BP 127/79 | HR 87 | Temp 98.3°F | Ht 75.0 in | Wt 314.0 lb

## 2012-11-25 DIAGNOSIS — R011 Cardiac murmur, unspecified: Secondary | ICD-10-CM | POA: Insufficient documentation

## 2012-11-25 DIAGNOSIS — I517 Cardiomegaly: Secondary | ICD-10-CM | POA: Insufficient documentation

## 2012-11-25 DIAGNOSIS — Z8739 Personal history of other diseases of the musculoskeletal system and connective tissue: Secondary | ICD-10-CM

## 2012-11-25 DIAGNOSIS — E785 Hyperlipidemia, unspecified: Secondary | ICD-10-CM

## 2012-11-25 DIAGNOSIS — E119 Type 2 diabetes mellitus without complications: Secondary | ICD-10-CM

## 2012-11-25 DIAGNOSIS — E790 Hyperuricemia without signs of inflammatory arthritis and tophaceous disease: Secondary | ICD-10-CM

## 2012-11-25 DIAGNOSIS — I10 Essential (primary) hypertension: Secondary | ICD-10-CM

## 2012-11-25 NOTE — Progress Notes (Signed)
Subjective:       Patient ID: Francis Trevino is a 57 y.o. male.    HPI  Here for follow-up of multiple medical problems.  Problem   Hyperuricemia    Has a gout flare once every 6 months, relieved with prn Aleve. Has been on Allopurinol in the past but did not like the side effects. Has been trying to avoid foods that trigger his gout.  Lab Results   Component Value Date    URICACID 10.3* 11/14/2012        Hyperlipidemia    Fasting lipids 11/02/11 showed chol 164, trig 279, HDL 34, LDL 74, non-HDL 130. Not on statins. Father had CABG for CAD in his 22s. Started on Simvastatin 20mg  qHs, compliant with medication. Eating healthier, walking more. Denies chest pain, SOB, myalgias.  Lab Results   Component Value Date    CHOL 116* 02/26/2012    CHOL 164 11/02/2011     Lab Results   Component Value Date    HDL 33* 02/26/2012    HDL 34* 11/02/2011     Lab Results   Component Value Date    LDL 49 02/26/2012    LDL 74 11/02/2011     Lab Results   Component Value Date    TRIG 168* 02/26/2012    TRIG 279* 11/02/2011        Type II Diabetes Mellitus    Diagnosed in 10/2011. HgA1c 8.6, fasting glucose 188 on 11/02/11. No prior diagnosis of DM. Crea 1.06, eGFR 79. Urine microalbumin 4.9 on 11/12/11. Had eye exam 10/2011 and no mention on retinopathy, scheduled for 11/2012. Monofilament foot exam and proprioception normal 11/14/2012, visual inspection showed nail deformities, dry skin and deformity R big toe. Has a Podiatrist but has not seen in more than a year. Was referred to see Diabetic Educator but has not seen. Compliant with Metformin 1000mg  BID, Glipizide XL 5mg  daily. Continue ASA 81mg  daily, and Lisinopril for renoprotection and HTN. Urine microalbumin/crea 8.3 on 11/14/12.  Lab Results   Component Value Date    HGBA1C 6.7* 02/26/2012     Lab Results   Component Value Date    HGBA1C 6.3* 11/14/2012        History of Gout    Has hyperuricemia. Having gout flares once every 6 months, relieved with Aleve prn.      Benign Essential Hypertension     Diagnosed for more than 10 years. Decreased Lisinopril-HCTZ 20-25mg  to Lisinopril 20mg  daily about 2 weeks ago since BP low normal. No home BP readings. No regular exercise but has been eating healthier. Denies chest pain, SOB, lightheadedness, headaches, vision changes. Father had CAD. Lost about 16 lbs in 6 months. Urinary frequency has improved since discontinuation of the HCTZ 25mg .         The following portions of the patient's history were reviewed and updated as appropriate: allergies, current medications, past family history, past medical history, past social history, past surgical history and problem list.    Review of Systems   Constitutional: Negative for fatigue.   Respiratory: Negative for chest tightness and shortness of breath.    Cardiovascular: Negative for chest pain, palpitations and leg swelling.   Genitourinary: Negative for frequency.   Musculoskeletal: Negative for arthralgias.   Neurological: Negative for light-headedness.           Objective:    Physical Exam   Vitals reviewed.  Constitutional: He is oriented to person, place, and time. No distress.  Neck: Carotid bruit is not present.   Cardiovascular: Normal rate, regular rhythm, S1 normal, S2 normal, intact distal pulses and normal pulses.    Murmur heard.   Systolic (best heard on L parasternal border, nonradiating) murmur is present with a grade of 2/6   Pulmonary/Chest: Effort normal and breath sounds normal. No respiratory distress. He has no wheezes. He has no rales.   Neurological: He is alert and oriented to person, place, and time.   Skin: Skin is warm and dry. He is not diaphoretic.   Psychiatric: He has a normal mood and affect. Judgment normal.     BP 127/79  Pulse 87  Temp 98.3 F (36.8 C) (Oral)  Ht 1.905 m (6\' 3" )  Wt 142.429 kg (314 lb)  BMI 39.25 kg/m2      Assessment:       1. Type II diabetes mellitus  Microalbumin, Random Urine    Hemoglobin A1C   2. Hyperlipidemia  Lipid panel   3. Benign essential  hypertension  Comprehensive metabolic panel   4. Hyperuricemia  Uric acid   5. Heart murmur  Echocardiogram 2D complete   6. History of gout           Plan:       Type II DM - Controlled. Goal HgA1c <6.5. Eye exam due. Continue ASA 81mg  daily, Lisinopril 20mg  daily for HTN and renoprotection, Metformin 1000mg  BID and Glipizide XL 5mg  daily (with dinner). LDL goal < 100. Received Pneumovax and flu vaccine 11/14/12.    Hyperuricemia - Uncontrolled. Goal serum uric acid < 6.0. Since not much frequent gout flares, pt opted to try to control it with diet.     History of gout - Control hyperuricemia. Since normal renal function, may use Aleve prn gout flare.    Benign essential HTN - Controlled. Goal BP < 140/90. Continue Lisinopril 20mg  daily.     Hyperlipidemia - LDL at goal < 100. HDL low, triglycerides elevated -- improved. Continue Simvastatin 20mg  qHs. Counseled on 7-10% weight loss, healthier diet and regular exercise.    Heart murmur - Pt mentioned that another doctor has mentioned it in the past. Had an extensive workup by Cardiologist in 2009 but not specifically from heart murmur (but from hypotension during anesthesia). Obtain records from Cardiologist. 2D-echo order placed.        Return in about 6 months (around 05/25/2013) for DM, HTN, hyperlipidemia, hyperuricemia with fasting labs 1 week prior.

## 2012-11-25 NOTE — Progress Notes (Signed)
Has the patient sought any care outside of the [facility]?NO

## 2012-11-25 NOTE — Patient Instructions (Addendum)
Please note that in the future if you need prescription refills, plan ahead and call our clinic during normal clinic hours only to ensure these get done in a timely fashion. If you are overdue for your follow up appointment, we may ask that you come in for a visit before refilling the medications. Thank you for your understanding.     Be sure you arrive to your appointments on time in the future. If you are late by 10 minutes or more, we will ask you reschedule.    For medical questions or concerns after hours, please call [phone].  _________________________________________________________________      Gout Diet  Gout is a painful condition caused by an excess of uric acid, a waste product made by the body. Uric acid forms crystals that collect in the joints. This brings on symptoms of joint pain and swelling. This is called a gout attack. Often, medications and diet changes are combined to manage gout. Below are some guidelines for changing your diet to help you manage gout and prevent attacks. Your healthcare provider will help you determine the best eating plan for you.     Limiting or avoiding certain foods can help prevent gout attacks.   Eating To Manage Gout  Eat Less Of These Foods   Eating too many foods containing purines may increase the levels of uric acid in your body. This increases your risk of a gout attack. Try to limit these foods and drinks that are high in purine:   Alcohol, such as beer and red wine (you may be told to avoid alcohol completely)   Certain fish, including anchovies, sardines, fish eggs, and herring   Certain meats, such as red meat, hot dogs, luncheon meats, and turkey   Organ meats, such as liver, kidneys, and sweetbreads   Legumes, such as dried beans and peas   Mushrooms, spinach, asparagus, and cauliflower  Eat More Of These Foods   Other foods may be helpful for people with gout. Add some of these foods to your diet:   Dark berries, such as blueberries,  blackberries, and cherries. These contain chemicals that may lower uric acid.   Tofu, a source of protein made from soy. Studies have shown that it may be a better choice than meat for people with gout.   Omega fatty acids. These are found in some fatty fish (such as salmon), certain oils (flax, olive, or nut), and nuts themselves. Omega fatty acids may help prevent inflammation due to gout.  The Following Diet Guidelines Are Recommended By The American Medical Association For People With Gout   Choose foods that are:   High in fiber: whole grains, fruits, and vegetables   Low in protein (15% of calories from lean protein such as soy, lean meats, and poultry)   Low in fat (no more than 30% of calories from fat, with only 10% from animal fat)  Follow Up  as advised by the doctor or our staff.  Get Prompt Medical Attention  if any of the following occurs:   Return of gout symptoms, usually at night:   Severe pain, swelling, and heat in a joint, especially the base of the big toe   Affected joint is hard to move   Skin of the affected joint is purple or red   Fever of 100.4F (38C) or higher   Pain that is not controlled by prescribed medication   2000-2014 [employer], [address], [place] [address]. All rights   reserved. This information is not intended as a substitute for professional medical care. Always follow your healthcare professional's instructions.

## 2012-11-28 ENCOUNTER — Encounter (INDEPENDENT_AMBULATORY_CARE_PROVIDER_SITE_OTHER): Payer: Self-pay | Admitting: Family Medicine

## 2012-11-28 DIAGNOSIS — I5189 Other ill-defined heart diseases: Secondary | ICD-10-CM | POA: Insufficient documentation

## 2012-11-28 DIAGNOSIS — I7789 Other specified disorders of arteries and arterioles: Secondary | ICD-10-CM | POA: Insufficient documentation

## 2012-12-18 ENCOUNTER — Other Ambulatory Visit (INDEPENDENT_AMBULATORY_CARE_PROVIDER_SITE_OTHER): Payer: Self-pay | Admitting: Family Medicine

## 2012-12-19 ENCOUNTER — Encounter (INDEPENDENT_AMBULATORY_CARE_PROVIDER_SITE_OTHER): Payer: Self-pay

## 2013-03-06 ENCOUNTER — Other Ambulatory Visit (INDEPENDENT_AMBULATORY_CARE_PROVIDER_SITE_OTHER): Payer: Self-pay | Admitting: Family Medicine

## 2013-03-09 ENCOUNTER — Other Ambulatory Visit (INDEPENDENT_AMBULATORY_CARE_PROVIDER_SITE_OTHER): Payer: Self-pay | Admitting: Family Medicine

## 2013-04-06 ENCOUNTER — Other Ambulatory Visit (INDEPENDENT_AMBULATORY_CARE_PROVIDER_SITE_OTHER): Payer: Self-pay | Admitting: Family Medicine

## 2013-05-17 ENCOUNTER — Other Ambulatory Visit (INDEPENDENT_AMBULATORY_CARE_PROVIDER_SITE_OTHER): Payer: Self-pay | Admitting: Family Medicine

## 2013-06-17 ENCOUNTER — Other Ambulatory Visit (INDEPENDENT_AMBULATORY_CARE_PROVIDER_SITE_OTHER): Payer: Self-pay | Admitting: Family Medicine

## 2013-06-20 ENCOUNTER — Encounter (INDEPENDENT_AMBULATORY_CARE_PROVIDER_SITE_OTHER): Payer: Self-pay

## 2013-06-20 NOTE — Telephone Encounter (Signed)
Patient notified via My Chart

## 2013-07-27 ENCOUNTER — Telehealth (INDEPENDENT_AMBULATORY_CARE_PROVIDER_SITE_OTHER): Payer: Self-pay | Admitting: Family Medicine

## 2013-07-27 NOTE — Telephone Encounter (Signed)
Pt made aware cant's make it today scheduled tomorrow at 10:40 with Dr Marisue Ivan

## 2013-07-27 NOTE — Telephone Encounter (Signed)
Patient had a espiode of possibly a kidney stone while he was on the job. Patient was taken to the emergency room and they did several test but didn't fine anything. Patient was given some medications for the pain and was told to follow up with his primary care doctor. Patient needs to be seen before he can return by to his job. 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 I did explain that the office only has same day appointment available for tomorrow but I will check if the doctor can fit him in tomorrow. Please let patient know if you are able to see him. Patient can be reached @ [phone]

## 2013-07-27 NOTE — Telephone Encounter (Signed)
Can be seen today at 120pm. Please call pt to see it appt today ok.

## 2013-07-28 ENCOUNTER — Ambulatory Visit (INDEPENDENT_AMBULATORY_CARE_PROVIDER_SITE_OTHER): Admitting: Family Medicine

## 2013-07-28 ENCOUNTER — Other Ambulatory Visit (INDEPENDENT_AMBULATORY_CARE_PROVIDER_SITE_OTHER): Payer: Self-pay | Admitting: Family Medicine

## 2013-07-28 ENCOUNTER — Encounter (INDEPENDENT_AMBULATORY_CARE_PROVIDER_SITE_OTHER): Payer: Self-pay | Admitting: Family Medicine

## 2013-07-28 ENCOUNTER — Other Ambulatory Visit (INDEPENDENT_AMBULATORY_CARE_PROVIDER_SITE_OTHER): Payer: Self-pay

## 2013-07-28 VITALS — BP 136/80 | HR 62 | Temp 98.0°F | Ht 75.0 in | Wt 309.4 lb

## 2013-07-28 DIAGNOSIS — E119 Type 2 diabetes mellitus without complications: Secondary | ICD-10-CM

## 2013-07-28 DIAGNOSIS — E785 Hyperlipidemia, unspecified: Secondary | ICD-10-CM

## 2013-07-28 DIAGNOSIS — I10 Essential (primary) hypertension: Secondary | ICD-10-CM

## 2013-07-28 DIAGNOSIS — R109 Unspecified abdominal pain: Secondary | ICD-10-CM

## 2013-07-28 DIAGNOSIS — N200 Calculus of kidney: Secondary | ICD-10-CM

## 2013-07-28 DIAGNOSIS — E790 Hyperuricemia without signs of inflammatory arthritis and tophaceous disease: Secondary | ICD-10-CM

## 2013-07-28 LAB — POCT URINALYSIS DIPSTIX (10)(MULTI-TEST)
Bilirubin, UA POCT: NEGATIVE
Blood, UA POCT: NEGATIVE
Glucose, UA POCT: NEGATIVE mg/dL
Ketones, UA POCT: NEGATIVE mg/dL
Nitrite, UA POCT: NEGATIVE
POCT Leukocytes, UA: NEGATIVE
POCT Spec Gravity, UA: 1.03 (ref 1.001–1.035)
POCT pH, UA: 5.5 (ref 5–8)
Protein, UA POCT: NEGATIVE mg/dL
Urobilinogen, UA: 0.2 mg/dL

## 2013-07-28 MED ORDER — TAMSULOSIN HCL 0.4 MG PO CAPS
0.4000 mg | ORAL_CAPSULE | ORAL | Status: DC
Start: 2013-07-28 — End: 2013-08-08

## 2013-07-28 MED ORDER — TAMSULOSIN HCL 0.4 MG PO CAPS
0.4000 mg | ORAL_CAPSULE | ORAL | Status: DC
Start: 2013-07-28 — End: 2013-07-28

## 2013-07-28 NOTE — Addendum Note (Signed)
Addended by: Ayesha Mohair on: 07/28/2013 11:08 AM     Modules accepted: Orders

## 2013-07-28 NOTE — Progress Notes (Signed)
Subjective:       Patient ID: Francis Trevino is a 58 y.o. male.    HPI  Here for ER follow-up R flank pain. 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, CT scan did not show kidney stones, and said that he may have passed it already. 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 Still having some pressure on his R flank but no hematuria, vomiting, nausea.    Problem   Recurrent Nephrolithiasis    Stone analysis showed 80% uric acid, 20% calcium oxalate. Seen by Urologist Arther Dames, MD on 05/18/11, given Toradol. CT abdomen/pelvis without contrast 05/24/11 showed approximately 8 mm stone involving the proximal right ureter resulting in stable hydronephrosis at approximately L3 slightly progressed since prior study , where it was at L2-L3. The left kidneys unremarkable. On 05/26/11, status post intraoperative retrograde pyelogram with successful placement right-sided double pigtail catheter.        The following portions of the patient's history were reviewed and updated as appropriate: allergies, current medications, past family history, past medical history, past social history, past surgical history and problem list.    Review of Systems   Constitutional: Negative for fever, chills and fatigue.   Respiratory: Negative for shortness of breath.    Cardiovascular: Negative for chest pain and palpitations.   Gastrointestinal: Positive for abdominal pain. Negative for nausea, vomiting, diarrhea, constipation, blood in stool and anal bleeding.   Genitourinary: Negative for hematuria and testicular pain.           Objective:    Physical Exam   Constitutional: He is oriented to person, place, and time. No distress.   Cardiovascular: Normal rate, regular rhythm, normal heart sounds and intact distal pulses.    No murmur heard.  Pulmonary/Chest: Effort normal and breath sounds normal. No respiratory distress. He has no wheezes. He has no  rales.   Abdominal: Soft. Bowel sounds are normal. There is tenderness in the epigastric area and periumbilical area. There is CVA tenderness (R).   Neurological: He is alert and oriented to person, place, and time.   Skin: He is not diaphoretic.   Psychiatric: He has a normal mood and affect. His behavior is normal. Judgment normal.   Vitals reviewed.    BP 136/80   Pulse 62   Temp(Src) 98 F (36.7 C) (Oral)   Ht 1.905 m (6\' 3" )   Wt 140.343 kg (309 lb 6.4 oz)   BMI 38.67 kg/m2         Assessment:       1. Flank pain  POCT urinalysis dipstick    Ambulatory referral to Urology    Comprehensive metabolic panel    tamsulosin (FLOMAX) 0.4 MG Cap    DISCONTINUED: tamsulosin (FLOMAX) 0.4 MG Cap   2. Recurrent nephrolithiasis  Ambulatory referral to Urology    Uric acid    tamsulosin (FLOMAX) 0.4 MG Cap    DISCONTINUED: tamsulosin (FLOMAX) 0.4 MG Cap   3. Uric acid nephrolithiasis  Uric acid          Plan:      Procedures    R flank pain - Likely from recurrent nephrolithiasis. Obtain records and results from [facility]. Leane Call. Since still with pressure in R flank, start Flomax 0.4mg  daily until passes the stone, advised to strain urine and will send for stone analysis once obtained. Refer to Urologist  for further evaluation and treatment. Informed pt that I could not clear him to work at this time, since still unsure if he still has the kidney stone, and will defer clearance to urologist. Increase PO fluids, lower purine diet. Given and discussed handout on diagnosis, treatment and warning signs.  Risk & Benefits of the new medication(s) were explained to the pt who appeared to understand & agree to the treatment plan.  Results    Procedure Component Value Units Date/Time    POCT urinalysis dipstick [[id number]]  (Normal) Collected:  07/28/13 1052     POCT Spec Gravity, UA 1.030 Updated:  07/28/13 1053     POCT pH, UA 5.5      Glucose, UA POCT Negative mg/dL      Protein, UA POCT Negative mg/dL      Ketones, UA POCT  Negative mg/dL      Blood, UA POCT Negative      POCT Leukocytes, UA Negative      Nitrite, UA POCT Negative      Bilirubin, UA POCT Negative      Urobilinogen, UA 0.2 mg/dL                Return in about 1 month (around 08/28/2013) for DM, HTN, hyperlipidemia with fasting labs 1 week prior.

## 2013-07-28 NOTE — Progress Notes (Signed)
Has the patient sought any care outside of the [facility]? YES    -Refer to care team-   Or  List Specialists:  [facility] - Flank Pain

## 2013-07-28 NOTE — Patient Instructions (Addendum)
Please note that in the future if you need prescription refills, plan ahead and call our clinic during normal clinic hours only to ensure these get done in a timely fashion. If you are overdue for your follow up appointment, we may ask that you come in for a visit before refilling the medications. Thank you for your understanding.     Be sure you arrive to your appointments on time in the future. If you are late by 10 minutes or more, we will ask you reschedule.    For medical questions or concerns after hours, please call [phone].  _________________________________________________________________      Preventing Kidney Stones  If you've had a kidney stone, you may worry that you'll have another. Removing or passing your stone doesn't prevent future stones. With your doctor's help, though, you can reduce your risk of forming new stones. Follow up with your doctor to help detect new stones. You may need follow-up every 3 months to a year for a lifetime.    Drink Lots of Water  Staying well-hydrated is the best way to reduce your risk of future stones. Drink8twelve-ounce glasses of water daily. Have two with each meal and two between meals. Try keeping a pitcher of water nearby during the day and at night.  Take Medications If Needed  Medications, including vitamins and minerals, may be prescribed for certain types of stones. You may want to write your doses and medication times on a calendar. Some medications decrease stone-forming chemicals in your blood. Others help prevent those chemicals from crystallizing in urine. Still others help keep a normal acid balance in your urine.  Follow Your Prescribed Diet  Your doctor will tell you which foods contain the chemicals you should avoid. Your doctor may also suggest talking to a dietitian. He or she can help you plan meals you'll enjoy. These meals won't put you at risk for future stones. You may be told to limit certain foods, depending upon which type of stones  you've had.  For calcium oxalate stones: Limit high-calcium foods (dairy products), and calcium or vitamin C supplements. Limit high-oxalate foods (such as cola, tea, chocolate, and peanuts).  For uric acid stones: Limit high-purine foods, such as anchovies, poultry, and organ meats. These foods increase uric acid production.  For cystine stones: Limit high-methionine foods (fish is the most common). These foods increase production of cystine.   [address] [place], [place] [address]. All rights reserved. This information is not intended as a substitute for professional medical care. Always follow your healthcare professional's instructions.

## 2013-08-01 ENCOUNTER — Ambulatory Visit (INDEPENDENT_AMBULATORY_CARE_PROVIDER_SITE_OTHER)

## 2013-08-01 ENCOUNTER — Encounter (INDEPENDENT_AMBULATORY_CARE_PROVIDER_SITE_OTHER): Payer: Self-pay | Admitting: Family Medicine

## 2013-08-01 DIAGNOSIS — N289 Disorder of kidney and ureter, unspecified: Secondary | ICD-10-CM | POA: Insufficient documentation

## 2013-08-01 DIAGNOSIS — R161 Splenomegaly, not elsewhere classified: Secondary | ICD-10-CM | POA: Insufficient documentation

## 2013-08-02 LAB — LIPID PANEL
Cholesterol / HDL Ratio: 3.1 (ref 0.0–5.0)
Cholesterol: 122 MG/DL — ABNORMAL LOW (ref 125–200)
HDL: 39 mg/dL — AB (ref >=40)
LDL Calculated: 58 mg/dL (ref <130)
Non HDL Cholesterol (LDL and VLDL): 83 mg/dL
Triglycerides: 125 MG/DL (ref <150)

## 2013-08-02 LAB — MICROALBUMIN, RANDOM URINE
Creatinine, UR: 153 mg/dL (ref 20–370)
Microalbumin/Creatinine Ratio: 3.9 mcg/mg crea
Microalbumin: 0.6 mg/dL

## 2013-08-02 LAB — COMPREHENSIVE METABOLIC PANEL
ALT: 14 U/L (ref 9–46)
AST (SGOT): 13 U/L (ref 10–35)
Albumin/Globulin Ratio: 2 (ref 1.0–2.5)
Albumin: 4.3 G/DL (ref 3.6–5.1)
Alkaline Phosphatase: 81 U/L (ref 40–115)
BUN: 16 MG/DL (ref 7–25)
Bilirubin, Total: 0.4 MG/DL (ref 0.2–1.2)
CO2: 23 mmol/L (ref 19–30)
Calcium: 8.8 MG/DL (ref 8.6–10.3)
Chloride: 107 mmol/L (ref 98–110)
Creatinine: 1.03 mg/dL (ref 0.70–1.33)
EGFR African American: 94 mL/min/{1.73_m2} (ref >=60)
EGFR: 81 mL/min/{1.73_m2} (ref >=60)
Globulin: 2.1 G/DL (ref 1.9–3.7)
Glucose: 98 MG/DL (ref 65–99)
Potassium: 4.2 mmol/L (ref 3.5–5.3)
Protein, Total: 6.4 G/DL (ref 6.1–8.1)
Sodium: 141 mmol/L (ref 135–146)

## 2013-08-02 LAB — URIC ACID: Uric Acid: 8.3 MG/DL — ABNORMAL HIGH (ref 4.0–8.0)

## 2013-08-02 LAB — HEMOGLOBIN A1C: Hemoglobin A1C: 6.3 % — ABNORMAL HIGH (ref <5.7)

## 2013-08-04 ENCOUNTER — Encounter (INDEPENDENT_AMBULATORY_CARE_PROVIDER_SITE_OTHER): Payer: Self-pay | Admitting: Family Medicine

## 2013-08-07 ENCOUNTER — Encounter (INDEPENDENT_AMBULATORY_CARE_PROVIDER_SITE_OTHER): Payer: Self-pay | Admitting: Family Medicine

## 2013-08-08 ENCOUNTER — Encounter (INDEPENDENT_AMBULATORY_CARE_PROVIDER_SITE_OTHER): Payer: Self-pay | Admitting: Family Medicine

## 2013-08-08 ENCOUNTER — Ambulatory Visit (INDEPENDENT_AMBULATORY_CARE_PROVIDER_SITE_OTHER): Admitting: Family Medicine

## 2013-08-08 VITALS — BP 139/84 | HR 80 | Temp 97.8°F | Ht 75.0 in | Wt 319.2 lb

## 2013-08-08 DIAGNOSIS — E119 Type 2 diabetes mellitus without complications: Secondary | ICD-10-CM

## 2013-08-08 DIAGNOSIS — E790 Hyperuricemia without signs of inflammatory arthritis and tophaceous disease: Secondary | ICD-10-CM

## 2013-08-08 DIAGNOSIS — N200 Calculus of kidney: Secondary | ICD-10-CM

## 2013-08-08 DIAGNOSIS — I10 Essential (primary) hypertension: Secondary | ICD-10-CM

## 2013-08-08 DIAGNOSIS — E785 Hyperlipidemia, unspecified: Secondary | ICD-10-CM

## 2013-08-08 DIAGNOSIS — R7989 Other specified abnormal findings of blood chemistry: Secondary | ICD-10-CM

## 2013-08-08 MED ORDER — LISINOPRIL 20 MG PO TABS
20.0000 mg | ORAL_TABLET | Freq: Every day | ORAL | Status: DC
Start: 2013-08-08 — End: 2013-08-08

## 2013-08-08 MED ORDER — LISINOPRIL 20 MG PO TABS
20.0000 mg | ORAL_TABLET | Freq: Every day | ORAL | Status: DC
Start: 2013-08-08 — End: 2014-08-30

## 2013-08-08 MED ORDER — METFORMIN HCL ER (OSM) 1000 MG PO TB24
1000.0000 mg | ORAL_TABLET | Freq: Two times a day (BID) | ORAL | Status: DC
Start: 2013-08-08 — End: 2014-01-03

## 2013-08-08 MED ORDER — ROSUVASTATIN CALCIUM 20 MG PO TABS
20.0000 mg | ORAL_TABLET | Freq: Every evening | ORAL | Status: DC
Start: 2013-08-08 — End: 2013-08-22

## 2013-08-08 NOTE — Patient Instructions (Signed)
Please note that in the future if you need prescription refills, plan ahead and call our clinic during normal clinic hours only to ensure these get done in a timely fashion. If you are overdue for your follow up appointment, we may ask that you come in for a visit before refilling the medications. Thank you for your understanding.     Be sure you arrive to your appointments on time in the future. If you are late by 10 minutes or more, we will ask you reschedule.    For medical questions or concerns after hours, please call [phone].  _________________________________________

## 2013-08-08 NOTE — Progress Notes (Signed)
Has the patient sought any care outside of the [facility]? YES    -Refer to care team-

## 2013-08-08 NOTE — Progress Notes (Signed)
Subjective:       Patient ID: Francis Trevino is a 58 y.o. male.    HPI  Here for 108-month follow-up of multiple medical problems.  Problem   Hyperuricemia    Has a gout flare once every 6 months, relieved with prn Aleve 400mg . Has been on Allopurinol in the past but caused abdominal issues. Uric acid 10.3 on 11/2012. Has been trying to avoid foods that trigger his gout. Also has history of uric acid kidney stones, recurrent.  Lab Results   Component Value Date    URICACID 8.3* 08/01/2013        Hyperlipidemia    Fasting lipids 11/02/11 showed chol 164, trig 279, HDL 34, LDL 74, non-HDL 130,  off statins. Father had CABG for CAD in his 66s. Started on Simvastatin 20mg  qHs in 02/2012, compliant with medication. Eating healthier, walking more. Denies chest pain, SOB, myalgias. Caucasian, non-smoker, has co-morbid HTN and DM.  Lab Results   Component Value Date    CHOL 122* 08/01/2013    CHOL 133 11/14/2012    CHOL 116* 02/26/2012     Lab Results   Component Value Date    HDL 39* 08/01/2013    HDL 32* 11/14/2012    HDL 33* 02/26/2012     Lab Results   Component Value Date    LDL 58 08/01/2013    LDL 69 11/14/2012    LDL 49 16/12/9602     Lab Results   Component Value Date    TRIG 125 08/01/2013    TRIG 159* 11/14/2012    TRIG 168* 02/26/2012        Type II Diabetes Mellitus    Diagnosed in 10/2011 with HgA1c 8.6, fasting glucose 188 on 11/02/11. No prior diagnosis of DM. Crea 1.06, eGFR 79. Urine microalbumin 4.9 on 11/12/11. Had eye exam 02/2013 and no mention on retinopathy. Monofilament foot exam and proprioception normal 11/14/2012, visual inspection showed nail deformities, dry skin and deformity R big toe. Has a Podiatrist but has not seen in more than a year. Was referred to see Diabetic Educator but has not seen. Compliant with Metformin 1000mg  daily (instead of the BID dosing when on the "road" since causing diarrhea), Glipizide XL 5mg  daily. Continue ASA 81mg  daily, and Lisinopril for renoprotection and HTN. Urine microalbumin/crea  3.9 on 08/01/13. HgA1c 6.7 on 02/2012, HgA1c 6.3 on 11/2012..  Lab Results   Component Value Date    HGBA1C 6.3* 08/01/2013         Benign Essential Hypertension    Diagnosed for more than 10 years. Decreased Lisinopril-HCTZ 20-25mg  to Lisinopril 20mg  daily about 2 weeks ago since BP low normal. No home BP readings. No regular exercise but has been eating healthier. Denies chest pain, SOB, lightheadedness, headaches, vision changes. Father had CAD. Lost about 16 lbs in 6 months. Urinary frequency has improved since discontinuation of the HCTZ 25mg .  BP Readings from Last 3 Encounters:   08/08/13 139/84   07/28/13 136/80   11/25/12 127/79        Recurrent Nephrolithiasis    Stone analysis showed 80% uric acid, 20% calcium oxalate. Seen by Urologist Arther Dames, MD on 05/18/11, given Toradol. CT abdomen/pelvis without contrast 05/24/11 showed approximately 8 mm stone involving the proximal right ureter resulting in stable hydronephrosis at approximately L3 slightly progressed since prior study , where it was at L2-L3. The left kidneys unremarkable. On 05/26/11, status post intraoperative retrograde pyelogram with successful placement right-sided double pigtail catheter. 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, CT scan did not show kidney stones, and said that he may have passed it already. Saw Urologist Dr. Karel Jarvis a week ago and said that he may have passed the stone and is clearing to go back to work. KUB showed no stones. No further abdominal pain.         The following portions of the patient's history were reviewed and updated as appropriate: allergies, current medications, past family history, past medical history, past social history, past surgical history and problem list.    Review of Systems   Constitutional: Negative for fatigue.   Respiratory: Negative for chest tightness and shortness of breath.    Cardiovascular: Negative for  chest pain, palpitations and leg swelling.   Gastrointestinal: Negative for abdominal pain.   Genitourinary: Negative for dysuria, frequency, hematuria and penile pain.   Musculoskeletal: Negative for myalgias and back pain.   Neurological: Negative for light-headedness.           Objective:    Physical Exam   Constitutional: He is oriented to person, place, and time. No distress.   Neck: Carotid bruit is not present.   Cardiovascular: Normal rate, regular rhythm, normal heart sounds and intact distal pulses.    No murmur heard.  Pulmonary/Chest: Effort normal and breath sounds normal. No respiratory distress. He has no wheezes. He has no rales.   Abdominal: There is no CVA tenderness.   Neurological: He is alert and oriented to person, place, and time.   Skin: Skin is warm and dry. He is not diaphoretic.   Psychiatric: He has a normal mood and affect. His behavior is normal. Judgment normal.   Vitals reviewed.    BP 139/84   Pulse 80   Temp(Src) 97.8 F (36.6 C) (Oral)   Ht 1.905 m (6\' 3" )   Wt 144.788 kg (319 lb 3.2 oz)   BMI 39.90 kg/m2         Assessment:       1. Type II diabetes mellitus  metFORMIN (FORTAMET) 1000 MG (OSM) 24 hr tablet    lisinopril (PRINIVIL,ZESTRIL) 20 MG tablet    Hemoglobin A1C    Comprehensive metabolic panel    DISCONTINUED: lisinopril (PRINIVIL,ZESTRIL) 20 MG tablet   2. Hyperlipidemia  rosuvastatin (CRESTOR) 20 MG tablet    Lipid panel    Comprehensive metabolic panel   3. Benign essential hypertension  lisinopril (PRINIVIL,ZESTRIL) 20 MG tablet    Comprehensive metabolic panel    DISCONTINUED: lisinopril (PRINIVIL,ZESTRIL) 20 MG tablet   4. Hyperuricemia  Uric acid    Comprehensive metabolic panel   5. Recurrent nephrolithiasis            Plan:      Procedures    Type II DM - Controlled. Goal HgA1c <6.5. Eye exam due 02/2014. Continue ASA 81mg  daily, Lisinopril 20mg  daily for HTN and renoprotection. Switch Metformin 1000mg  BID to Metformin XL 1000mg  BID (to help decrease  diarrhea). Discontinue Glipizide XL 5mg  daily (with dinner). Repeat HgA1c in 3 months (11/2013). Consider Bydureon or Byetta to help with weight loss if HgA1c not under control.  Received Pneumovax and flu vaccine 11/14/12.    Hyperlipidemia - 10-year ASCVD risk (off statins) is 16.3%, recommended to be on high-intensity statins. Switch Simvastatin 20mg  qHs to Crestor 20mg  qHs. Given 24-month supply, pt to call if tolerates well and will [facility]. Counseled on 7-10% weight loss, healthier diet and regular exercise. Repeat fasting lipids in 3 months (11/2013).    Benign essential HTN - Controlled. Goal BP < 140/90. Continue Lisinopril 20mg  daily.     Hyperuricemia - Uncontrolled. Goal serum uric acid < 6.0. Declined starting Uloric at this time. Improve on diet. Recheck uric acid in 3 months (11/2013)    Recurrent nephrolithiasis - Follow-up with Urologist prn. Control uric acid level. Given note to release back to work but advised to obtain clearance and note from Urologist Dr. Karel Jarvis.         Return in about 3 months (around 11/08/2013) for DM, HTN, hyperuricemia, hyperlipidemia with fasting labs 1 week prior. [facility] Care Plan Letter reviewed and completed today with patient, and given copy.    Total visit time 40, of which 30 minutes spent on counseling and coordination of care of multiple medical issues described above, answering questions and addressing concerns.

## 2013-08-09 ENCOUNTER — Encounter (INDEPENDENT_AMBULATORY_CARE_PROVIDER_SITE_OTHER): Payer: Self-pay | Admitting: Family Medicine

## 2013-08-19 ENCOUNTER — Emergency Department

## 2013-08-19 ENCOUNTER — Emergency Department
Admission: EM | Admit: 2013-08-19 | Discharge: 2013-08-19 | Disposition: A | Discharge status: Home / Self Care | Attending: Emergency Medicine | Admitting: Emergency Medicine

## 2013-08-19 DIAGNOSIS — T23249A Burn of second degree of unspecified multiple fingers (nail), including thumb, initial encounter: Secondary | ICD-10-CM | POA: Insufficient documentation

## 2013-08-19 DIAGNOSIS — X131XXA Other contact with steam and other hot vapors, initial encounter: Secondary | ICD-10-CM | POA: Insufficient documentation

## 2013-08-19 DIAGNOSIS — T310 Burns involving less than 10% of body surface: Secondary | ICD-10-CM | POA: Insufficient documentation

## 2013-08-19 DIAGNOSIS — Y93G2 Activity, grilling and smoking food: Secondary | ICD-10-CM | POA: Insufficient documentation

## 2013-08-19 DIAGNOSIS — T23202A Burn of second degree of left hand, unspecified site, initial encounter: Secondary | ICD-10-CM

## 2013-08-19 DIAGNOSIS — X12XXXA Contact with other hot fluids, initial encounter: Secondary | ICD-10-CM | POA: Insufficient documentation

## 2013-08-19 MED ORDER — HYDROCODONE-ACETAMINOPHEN 5-325 MG PO TABS
ORAL_TABLET | ORAL | Status: DC
Start: 2013-08-19 — End: 2014-06-27

## 2013-08-19 MED ORDER — SILVER SULFADIAZINE 1 % EX CREA
TOPICAL_CREAM | Freq: Two times a day (BID) | CUTANEOUS | Status: DC
Start: 2013-08-19 — End: 2013-08-19
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
## 2013-08-19 NOTE — ED Notes (Signed)
PA-C Kathlene November made aware of BP at [employer]
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
## 2013-08-22 ENCOUNTER — Other Ambulatory Visit (INDEPENDENT_AMBULATORY_CARE_PROVIDER_SITE_OTHER): Payer: Self-pay | Admitting: Family Medicine

## 2013-08-22 DIAGNOSIS — E785 Hyperlipidemia, unspecified: Secondary | ICD-10-CM

## 2013-08-22 MED ORDER — ROSUVASTATIN CALCIUM 20 MG PO TABS
20.0000 mg | ORAL_TABLET | Freq: Every evening | ORAL | Status: DC
Start: 2013-08-22 — End: 2013-11-01

## 2013-08-22 NOTE — Telephone Encounter (Signed)
Patient aware of Crestor being sent to [facility] and glipizide was discontinued at last visit.

## 2013-08-22 NOTE — Telephone Encounter (Signed)
Patient requesting prescriptions to be sent to [facility].  He states he is out of the glipizide.

## 2013-11-01 ENCOUNTER — Other Ambulatory Visit (INDEPENDENT_AMBULATORY_CARE_PROVIDER_SITE_OTHER): Payer: Self-pay | Admitting: Family Medicine

## 2013-11-03 ENCOUNTER — Encounter (INDEPENDENT_AMBULATORY_CARE_PROVIDER_SITE_OTHER): Payer: Self-pay

## 2013-11-03 NOTE — Telephone Encounter (Signed)
Pt made aware via my chart

## 2013-11-08 ENCOUNTER — Other Ambulatory Visit (INDEPENDENT_AMBULATORY_CARE_PROVIDER_SITE_OTHER): Payer: Self-pay | Admitting: Family Medicine

## 2013-11-08 DIAGNOSIS — E119 Type 2 diabetes mellitus without complications: Secondary | ICD-10-CM

## 2013-11-08 DIAGNOSIS — I10 Essential (primary) hypertension: Secondary | ICD-10-CM

## 2013-11-08 DIAGNOSIS — E790 Hyperuricemia without signs of inflammatory arthritis and tophaceous disease: Secondary | ICD-10-CM

## 2013-11-08 DIAGNOSIS — E785 Hyperlipidemia, unspecified: Secondary | ICD-10-CM

## 2014-01-03 ENCOUNTER — Other Ambulatory Visit (INDEPENDENT_AMBULATORY_CARE_PROVIDER_SITE_OTHER): Payer: Self-pay

## 2014-01-03 NOTE — Telephone Encounter (Signed)
Pharmacy refill request

## 2014-01-04 MED ORDER — METFORMIN HCL ER (OSM) 1000 MG PO TB24
1000.0000 mg | ORAL_TABLET | Freq: Two times a day (BID) | ORAL | Status: DC
Start: 2014-01-04 — End: 2014-03-17

## 2014-01-05 ENCOUNTER — Encounter (INDEPENDENT_AMBULATORY_CARE_PROVIDER_SITE_OTHER): Payer: Self-pay

## 2014-01-05 NOTE — Telephone Encounter (Signed)
Pt made aware via my chart

## 2014-03-05 ENCOUNTER — Encounter (INDEPENDENT_AMBULATORY_CARE_PROVIDER_SITE_OTHER): Payer: Self-pay | Admitting: Family Medicine

## 2014-03-17 ENCOUNTER — Other Ambulatory Visit (INDEPENDENT_AMBULATORY_CARE_PROVIDER_SITE_OTHER): Payer: Self-pay | Admitting: Family Medicine

## 2014-05-29 ENCOUNTER — Other Ambulatory Visit (INDEPENDENT_AMBULATORY_CARE_PROVIDER_SITE_OTHER): Payer: Self-pay | Admitting: Family Medicine

## 2014-06-27 ENCOUNTER — Ambulatory Visit (INDEPENDENT_AMBULATORY_CARE_PROVIDER_SITE_OTHER): Admitting: Family Medicine

## 2014-06-27 ENCOUNTER — Encounter (INDEPENDENT_AMBULATORY_CARE_PROVIDER_SITE_OTHER): Payer: Self-pay | Admitting: Family Medicine

## 2014-06-27 VITALS — BP 169/98 | HR 90 | Temp 98.1°F | Resp 20 | Ht 75.0 in | Wt 317.0 lb

## 2014-06-27 DIAGNOSIS — IMO0002 Reserved for concepts with insufficient information to code with codable children: Secondary | ICD-10-CM | POA: Insufficient documentation

## 2014-06-27 DIAGNOSIS — G4733 Obstructive sleep apnea (adult) (pediatric): Secondary | ICD-10-CM

## 2014-06-27 DIAGNOSIS — I519 Heart disease, unspecified: Secondary | ICD-10-CM

## 2014-06-27 DIAGNOSIS — Z8739 Personal history of other diseases of the musculoskeletal system and connective tissue: Secondary | ICD-10-CM

## 2014-06-27 DIAGNOSIS — E790 Hyperuricemia without signs of inflammatory arthritis and tophaceous disease: Secondary | ICD-10-CM

## 2014-06-27 DIAGNOSIS — I5189 Other ill-defined heart diseases: Secondary | ICD-10-CM

## 2014-06-27 DIAGNOSIS — E7849 Other hyperlipidemia: Secondary | ICD-10-CM

## 2014-06-27 DIAGNOSIS — E784 Other hyperlipidemia: Secondary | ICD-10-CM

## 2014-06-27 DIAGNOSIS — E1165 Type 2 diabetes mellitus with hyperglycemia: Secondary | ICD-10-CM

## 2014-06-27 DIAGNOSIS — I7789 Other specified disorders of arteries and arterioles: Secondary | ICD-10-CM

## 2014-06-27 DIAGNOSIS — I10 Essential (primary) hypertension: Secondary | ICD-10-CM

## 2014-06-27 DIAGNOSIS — D3500 Benign neoplasm of unspecified adrenal gland: Secondary | ICD-10-CM

## 2014-06-27 MED ORDER — HYDROCHLOROTHIAZIDE 25 MG PO TABS
25.0000 mg | ORAL_TABLET | Freq: Every day | ORAL | Status: DC
Start: 2014-06-27 — End: 2014-06-27

## 2014-06-27 MED ORDER — HYDROCHLOROTHIAZIDE 25 MG PO TABS
25.0000 mg | ORAL_TABLET | Freq: Every day | ORAL | Status: DC
Start: 2014-06-27 — End: 2015-02-05

## 2014-06-27 MED ORDER — SIMVASTATIN 20 MG PO TABS
20.0000 mg | ORAL_TABLET | Freq: Every evening | ORAL | Status: DC
Start: 2014-06-27 — End: 2014-12-06

## 2014-06-27 NOTE — Progress Notes (Signed)
1. Have you self referred yourself since we last saw you? Yes    Refer to care team Dermatologist    Or  Add specialists: Dr. Graciella Belton

## 2014-06-27 NOTE — Progress Notes (Signed)
Subjective:       Patient ID: Francis Trevino is a 59 y.o. male.    HPI    . Mild obstructive sleep apnea      Noted mild degree of sleep apnea on polysomnogram  .  had CPAP  machine.       . Hyperlipidemia: on  Simvastatin 20 mg       Lab Results   Component Value Date    CHOL 122* 08/01/2013    CHOL 133 11/14/2012    CHOL 116* 02/26/2012     Lab Results   Component Value Date    HDL 39* 08/01/2013    HDL 32* 11/14/2012    HDL 33* 02/26/2012     Lab Results   Component Value Date    LDL 58 08/01/2013    LDL 69 11/14/2012    LDL 49 02/26/2012     Lab Results   Component Value Date    TRIG 125 08/01/2013    TRIG 159* 11/14/2012    TRIG 168* 02/26/2012   .     Marland Kitchen Type II diabetes mellitus      . Will refer to Diabetic Educator and will start Metformin  daily 1000mg  BID . Continue ASA 81mg  daily, and Lisinopril for renoprotection and HTN.   Lab Results   Component Value Date    HGBA1C 6.3* 08/01/2013      . Adrenal adenoma      .Pending Endocrinology consult Dr Benjiman Core, cleared          . Multiple joint pain    . History of gout: off and on , better with Aleve     . Benign essential hypertension;uncontrolled      . Compliant with Lisinopril- 20- daily. Not doing Home BP readings .     BP Readings from Last 3 Encounters:   06/27/14 169/98   08/19/13 162/98   08/08/13 139/84      . Recurrent nephrolithiasis      Stone analysis showed 80% uric acid, 20% calcium oxalate. Seen by Urologist Arther Dames, MD on 05/18/11, given Toradol..             The following portions of the patient's history were reviewed and updated as appropriate:   He  has a past medical history of Pain in Achilles tendon (2007); Kidney calculi (05/2010); Hypertension; History of gout; Calcium oxalate kidney stones (05/2010); Arthritis; Diabetes mellitus; and Hyperlipidemia.  He  does not have any pertinent problems on file.  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  His family  history includes Alzheimer's disease in his mother; Cancer in his father and mother; Diabetes in his brother and father; Heart disease in his father; Hypertension in his brother. There is no history of Hyperlipidemia, Kidney disease, or Stroke.  He  reports that he quit smoking about 18 years ago. His smoking use included Cigarettes. He has a 30 pack-year smoking history. He does not have any smokeless tobacco history on file. He reports that he drinks alcohol. He reports that he does not use illicit drugs.  He has a current medication list which includes the following prescription(s): aspirin ec, diclofenac sodium, lisinopril, metformin, and naproxen sodium.  Current Outpatient Prescriptions on File Prior to Visit   Medication Sig Dispense Refill   . aspirin EC 81 MG EC tablet Take 81 mg by mouth daily.     . Diclofenac Sodium (VOLTAREN) 1 %  GEL Apply 2 g topically 4 (four) times daily. 100 g 3   . lisinopril (PRINIVIL,ZESTRIL) 20 MG tablet Take 1 tablet (20 mg total) by mouth daily. 90 tablet 3   . metFORMIN (FORTAMET) 1000 MG (OSM) 24 hr tablet TAKE 1 TABLET TWICE A DAY 180 tablet 0   . Naproxen Sodium (ALEVE PO) Take by mouth as needed.     . [DISCONTINUED] CRESTOR 20 MG tablet TAKE 1 TABLET NIGHTLY 90 tablet 0   . [DISCONTINUED] glipiZIDE XL (GLUCOTROL XL) 5 MG 24 hr tablet Take 5 mg by mouth daily.     . [DISCONTINUED] HYDROcodone-acetaminophen (NORCO) 5-325 MG per tablet Take 1-2 tablets by mouth every 6 hours as needed for pain. 20 tablet 0     No current facility-administered medications on file prior to visit.     He is allergic to sulfa antibiotics and allopurinol..    Review of Systems   Respiratory: Negative for chest tightness and wheezing.    Cardiovascular: Negative for chest pain and palpitations.           Objective:    Physical Exam   Constitutional: He appears well-developed and well-nourished.   Cardiovascular: Normal rate, regular rhythm and normal heart sounds.  Exam reveals no gallop and no  friction rub.    No murmur heard.  Pulmonary/Chest: No respiratory distress. He has no wheezes. He has no rales. He exhibits no tenderness.                     Assessment:/Plan:         1. Benign essential hypertension : uncontrolled  hydrochlorothiazide (HYDRODIURIL) 25 MG tablet, continue Lisinopril 20 mg     Ambulatory referral to Cardiology    CBC and differential    Comprehensive metabolic panel    Lipid panel    TSH    Urinalysis    Vitamin D,25 OH, Total   2. Other hyperlipidemia  Simvastatin 20 mg    3. Diastolic dysfunction  Cardiology follow up    4. Aortic root enlargement  Ambulatory referral to Cardiology   5. Adrenal adenoma, unspecified laterality  Stable    6. History of gout  On Aleve PRN, had diarrhea with allopurinol on Simvastatin    7. Mild obstructive sleep apnea  On CPAP machine   8. Hyperuricemia     9. Type II diabetes mellitus, uncontrolled  Hemoglobin A1C    Microalbumin, Random Urine       Procedures

## 2014-06-28 ENCOUNTER — Other Ambulatory Visit (FREE_STANDING_LABORATORY_FACILITY)

## 2014-06-28 DIAGNOSIS — E1165 Type 2 diabetes mellitus with hyperglycemia: Secondary | ICD-10-CM

## 2014-06-28 DIAGNOSIS — I10 Essential (primary) hypertension: Secondary | ICD-10-CM

## 2014-06-28 DIAGNOSIS — IMO0002 Reserved for concepts with insufficient information to code with codable children: Secondary | ICD-10-CM

## 2014-06-28 LAB — CBC AND DIFFERENTIAL
Basophils Absolute Automated: 0.05 10*3/uL (ref 0.00–0.20)
Basophils Automated: 1 %
Eosinophils Absolute Automated: 0.14 10*3/uL (ref 0.00–0.70)
Eosinophils Automated: 2 %
Hematocrit: 44.6 % (ref 42.0–52.0)
Hgb: 14.3 g/dL (ref 13.0–17.0)
Immature Granulocytes Absolute: 0.03 10*3/uL
Immature Granulocytes: 1 %
Lymphocytes Absolute Automated: 1.06 10*3/uL (ref 0.50–4.40)
Lymphocytes Automated: 18 %
MCH: 27.7 pg — ABNORMAL LOW (ref 28.0–32.0)
MCHC: 32.1 g/dL (ref 32.0–36.0)
MCV: 86.3 fL (ref 80.0–100.0)
MPV: 10.5 fL (ref 9.4–12.3)
Monocytes Absolute Automated: 0.41 10*3/uL (ref 0.00–1.20)
Monocytes: 7 %
Neutrophils Absolute: 4.12 10*3/uL (ref 1.80–8.10)
Neutrophils: 71 %
Nucleated RBC: 0 /100 WBC (ref 0–1)
Platelets: 155 10*3/uL (ref 140–400)
RBC: 5.17 10*6/uL (ref 4.70–6.00)
RDW: 15 % (ref 12–15)
WBC: 5.81 10*3/uL (ref 3.50–10.80)

## 2014-06-28 LAB — COMPREHENSIVE METABOLIC PANEL
ALT: 22 U/L (ref 0–55)
AST (SGOT): 16 U/L (ref 5–34)
Albumin/Globulin Ratio: 1.5 (ref 0.9–2.2)
Albumin: 3.8 g/dL (ref 3.5–5.0)
Alkaline Phosphatase: 109 U/L — ABNORMAL HIGH (ref 38–106)
BUN: 15 mg/dL (ref 9.0–28.0)
Bilirubin, Total: 0.4 mg/dL (ref 0.1–1.2)
CO2: 23 mEq/L (ref 21–30)
Calcium: 9 mg/dL (ref 8.5–10.5)
Chloride: 105 mEq/L (ref 100–111)
Creatinine: 1 mg/dL (ref 0.5–1.5)
Globulin: 2.6 g/dL (ref 2.0–3.7)
Glucose: 298 mg/dL — ABNORMAL HIGH (ref 70–100)
Potassium: 4.3 mEq/L (ref 3.5–5.3)
Protein, Total: 6.4 g/dL (ref 6.0–8.3)
Sodium: 135 mEq/L (ref 135–146)

## 2014-06-28 LAB — URINALYSIS
Bilirubin, UA: NEGATIVE
Blood, UA: NEGATIVE
Glucose, UA: >=1000 — AB
Ketones UA: NEGATIVE
Leukocyte Esterase, UA: NEGATIVE
Nitrite, UA: NEGATIVE
Protein, UR: NEGATIVE
Specific Gravity UA: 1.022 (ref 1.001–1.035)
Urine pH: 5.5 (ref 5.0–8.0)
Urobilinogen, UA: 0.2 (ref 0.2–2.0)

## 2014-06-28 LAB — GFR: EGFR: >60

## 2014-06-28 LAB — VITAMIN D,25 OH,TOTAL: Vitamin D, 25 OH, Total: 14 ng/mL — ABNORMAL LOW (ref 30–100)

## 2014-06-28 LAB — LIPID PANEL
Cholesterol / HDL Ratio: 5.1
Cholesterol: 162 mg/dL (ref 0–199)
HDL: 32 mg/dL — ABNORMAL LOW (ref >40)
LDL Calculated: 76 mg/dL (ref 0–99)
Triglycerides: 270 mg/dL — ABNORMAL HIGH (ref 34–149)
VLDL Calculated: 54 mg/dL — ABNORMAL HIGH (ref 10–40)

## 2014-06-28 LAB — TSH: TSH: 2.08 u[IU]/mL (ref 0.35–4.94)

## 2014-06-28 LAB — HEMOLYSIS INDEX: Hemolysis Index: 6 (ref 0–18)

## 2014-06-28 LAB — MICROALBUMIN, RANDOM URINE
Urine Creatinine, Random: 48.2
Urine Microalbumin, Random: 34 — ABNORMAL HIGH (ref 0.0–30.0)
Urine Microalbumin/Creatinine Ratio: 71 ug/mg (ref 66–163)

## 2014-06-28 LAB — HEMOGLOBIN A1C: Hemoglobin A1C: 8.3 % — ABNORMAL HIGH (ref 0.0–6.0)

## 2014-06-29 ENCOUNTER — Encounter (INDEPENDENT_AMBULATORY_CARE_PROVIDER_SITE_OTHER): Payer: Self-pay | Admitting: Family Medicine

## 2014-07-05 ENCOUNTER — Encounter (INDEPENDENT_AMBULATORY_CARE_PROVIDER_SITE_OTHER): Payer: Self-pay | Admitting: Family Medicine

## 2014-07-05 ENCOUNTER — Ambulatory Visit (INDEPENDENT_AMBULATORY_CARE_PROVIDER_SITE_OTHER): Admitting: Family Medicine

## 2014-07-05 VITALS — BP 129/87 | HR 106 | Temp 98.5°F | Resp 20 | Ht 75.0 in | Wt 314.0 lb

## 2014-07-05 DIAGNOSIS — E1165 Type 2 diabetes mellitus with hyperglycemia: Secondary | ICD-10-CM

## 2014-07-05 DIAGNOSIS — E784 Other hyperlipidemia: Secondary | ICD-10-CM

## 2014-07-05 DIAGNOSIS — I10 Essential (primary) hypertension: Secondary | ICD-10-CM

## 2014-07-05 DIAGNOSIS — E7849 Other hyperlipidemia: Secondary | ICD-10-CM

## 2014-07-05 DIAGNOSIS — IMO0002 Reserved for concepts with insufficient information to code with codable children: Secondary | ICD-10-CM

## 2014-07-05 MED ORDER — GLIPIZIDE ER 2.5 MG PO TB24
2.5000 mg | ORAL_TABLET | Freq: Two times a day (BID) | ORAL | Status: DC
Start: 2014-07-05 — End: 2016-04-22

## 2014-07-05 MED ORDER — ERGOCALCIFEROL 1.25 MG (50000 UT) PO CAPS
50000.0000 [IU] | ORAL_CAPSULE | ORAL | Status: AC
Start: 2014-07-05 — End: 2015-07-05

## 2014-07-05 NOTE — Progress Notes (Signed)
1. Have you self referred yourself since we last saw you? No  Refer to care team   Or   Add specialists:

## 2014-07-05 NOTE — Progress Notes (Signed)
Subjective:       Patient ID: Francis Trevino is a 59 y.o. male.    HPI      . Mild obstructive sleep apnea      Noted mild degree of sleep apnea on polysomnogram  .  had CPAP  machine.       . Hyperlipidemia: on  Simvastatin 20 mg , well controlled       Lab Results   Component Value Date    CHOL 162 06/28/2014    CHOL 122* 08/01/2013    CHOL 133 11/14/2012     Lab Results   Component Value Date    HDL 32* 06/28/2014    HDL 39* 08/01/2013    HDL 32* 11/14/2012     Lab Results   Component Value Date    LDL 76 06/28/2014    LDL 58 08/01/2013    LDL 69 11/14/2012     Lab Results   Component Value Date    TRIG 270* 06/28/2014    TRIG 125 08/01/2013    TRIG 159* 11/14/2012   .     Marland Kitchen Type II diabetes mellitus      . Will refer to Diabetic Educator and will start Metformin  daily 1000mg  BID . Continue ASA 81mg  daily, and Lisinopril for renoprotection and HTN.   Lab Results   Component Value Date    HGBA1C 8.3* 06/28/2014      . Adrenal adenoma      .Pending Endocrinology consult Dr Francis Trevino, cleared          . Multiple joint pain    . History of gout: off and on , better with Aleve     . Benign essential hypertension;uncontrolled      . Compliant with Lisinopril- 20- daily. Not doing Home BP readings .     BP Readings from Last 3 Encounters:   07/05/14 129/87   06/27/14 169/98   08/19/13 162/98      . Recurrent nephrolithiasis      Follow up  by Urologist Francis Trevino, .Marland Kitchen             The following portions of the patient's history were reviewed and updated as appropriate:   He  has a past medical history of Pain in Achilles tendon (2007); Kidney calculi (05/2010); Hypertension; History of gout; Calcium oxalate kidney stones (05/2010); Arthritis; Diabetes mellitus; and Hyperlipidemia.  He  does not have any pertinent problems on file.  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  His family history includes Alzheimer's disease in his mother; Cancer in  his father and mother; Diabetes in his brother and father; Heart disease in his father; Hypertension in his brother. There is no history of Hyperlipidemia, Kidney disease, or Stroke.  He  reports that he quit smoking about 18 years ago. His smoking use included Cigarettes. He has a 30 pack-year smoking history. He does not have any smokeless tobacco history on file. He reports that he drinks alcohol. He reports that he does not use illicit drugs.  He has a current medication list which includes the following prescription(s): aspirin ec, diclofenac sodium, hydrochlorothiazide, lisinopril, metformin, naproxen sodium, and simvastatin.  Current Outpatient Prescriptions on File Prior to Visit   Medication Sig Dispense Refill   . aspirin EC 81 MG EC tablet Take 81 mg by mouth daily.     . Diclofenac Sodium (VOLTAREN) 1 % GEL Apply 2  g topically 4 (four) times daily. 100 g 3   . hydrochlorothiazide (HYDRODIURIL) 25 MG tablet Take 1 tablet (25 mg total) by mouth daily. 90 tablet 1   . lisinopril (PRINIVIL,ZESTRIL) 20 MG tablet Take 1 tablet (20 mg total) by mouth daily. 90 tablet 3   . metFORMIN (FORTAMET) 1000 MG (OSM) 24 hr tablet TAKE 1 TABLET TWICE A DAY 180 tablet 0   . Naproxen Sodium (ALEVE PO) Take by mouth as needed.     . simvastatin (ZOCOR) 20 MG tablet Take 1 tablet (20 mg total) by mouth nightly. 90 tablet 1     No current facility-administered medications on file prior to visit.     He is allergic to sulfa antibiotics and allopurinol..    Review of Systems   Respiratory: Negative for chest tightness and wheezing.    Cardiovascular: Negative for chest pain and palpitations.           Objective:    Physical Exam   Constitutional: He appears well-developed and well-nourished.   Cardiovascular: Normal rate, regular rhythm and normal heart sounds.  Exam reveals no gallop and no friction rub.    No murmur heard.  Pulmonary/Chest: No respiratory distress. He has no wheezes. He has no rales. He exhibits no tenderness.        Assessment:/Plan:       1. Benign essential hypertension  Continue same med   2. Other hyperlipidemia  Continue same med   3. Type II diabetes mellitus, uncontrolled  Glucophage 1 g bid , Glipizide 2.5 mg BID, check blood glucose BID and follow up in 1 month     Procedures

## 2014-07-18 ENCOUNTER — Encounter (INDEPENDENT_AMBULATORY_CARE_PROVIDER_SITE_OTHER): Payer: Self-pay | Admitting: Family Medicine

## 2014-07-19 ENCOUNTER — Encounter (INDEPENDENT_AMBULATORY_CARE_PROVIDER_SITE_OTHER): Payer: Self-pay | Admitting: Family Medicine

## 2014-07-19 ENCOUNTER — Telehealth (INDEPENDENT_AMBULATORY_CARE_PROVIDER_SITE_OTHER): Payer: Self-pay | Admitting: Family Medicine

## 2014-07-19 NOTE — Telephone Encounter (Signed)
Pt email regarding about letter stated that he's treatment.     My new employer needs a letter saying that Dr Wyvonnia Lora is treating me for diabetes and tha I am not insulin dependent. I would need this letter forwarded to attention Garen Grams by fax at [phone]. Please call me at [phone] if you have any questions

## 2014-07-19 NOTE — Telephone Encounter (Signed)
Pt notified, letter is ready for pickup. Pt request letter be left at front desk for his wife, Lurena Joiner to pu for him. Letter left at front desk.

## 2014-08-30 ENCOUNTER — Other Ambulatory Visit (INDEPENDENT_AMBULATORY_CARE_PROVIDER_SITE_OTHER): Payer: Self-pay | Admitting: Family Medicine

## 2014-12-06 ENCOUNTER — Other Ambulatory Visit (INDEPENDENT_AMBULATORY_CARE_PROVIDER_SITE_OTHER): Payer: Self-pay | Admitting: Family Medicine

## 2015-02-05 ENCOUNTER — Other Ambulatory Visit (INDEPENDENT_AMBULATORY_CARE_PROVIDER_SITE_OTHER): Payer: Self-pay | Admitting: Family Medicine

## 2015-03-05 ENCOUNTER — Other Ambulatory Visit (INDEPENDENT_AMBULATORY_CARE_PROVIDER_SITE_OTHER): Payer: Self-pay | Admitting: Family Medicine

## 2015-03-05 MED ORDER — SIMVASTATIN 20 MG PO TABS
ORAL_TABLET | ORAL | Status: AC
Start: 2015-03-05 — End: ?

## 2015-05-29 ENCOUNTER — Other Ambulatory Visit (INDEPENDENT_AMBULATORY_CARE_PROVIDER_SITE_OTHER): Payer: Self-pay | Admitting: Family Medicine

## 2015-05-29 ENCOUNTER — Other Ambulatory Visit (INDEPENDENT_AMBULATORY_CARE_PROVIDER_SITE_OTHER): Payer: Self-pay

## 2015-05-29 MED ORDER — LISINOPRIL 20 MG PO TABS
ORAL_TABLET | ORAL | Status: AC
Start: 2015-05-29 — End: ?

## 2015-05-29 NOTE — Telephone Encounter (Signed)
30 days supply faxed to his pharmacy Follow up in the office for med refill

## 2015-05-29 NOTE — Telephone Encounter (Signed)
Pharmacy faxed request for medication refill which was forwarded to provider for approval.

## 2015-11-01 ENCOUNTER — Emergency Department

## 2015-11-01 ENCOUNTER — Emergency Department
Admission: EM | Admit: 2015-11-01 | Discharge: 2015-11-01 | Disposition: A | Discharge status: Home / Self Care | Attending: Emergency Medicine | Admitting: Emergency Medicine

## 2015-11-01 DIAGNOSIS — R1031 Right lower quadrant pain: Secondary | ICD-10-CM | POA: Insufficient documentation

## 2015-11-01 DIAGNOSIS — Z87442 Personal history of urinary calculi: Secondary | ICD-10-CM | POA: Insufficient documentation

## 2015-11-01 DIAGNOSIS — R109 Unspecified abdominal pain: Secondary | ICD-10-CM

## 2015-11-01 DIAGNOSIS — Z87891 Personal history of nicotine dependence: Secondary | ICD-10-CM | POA: Insufficient documentation

## 2015-11-01 DIAGNOSIS — I10 Essential (primary) hypertension: Secondary | ICD-10-CM | POA: Insufficient documentation

## 2015-11-01 DIAGNOSIS — E119 Type 2 diabetes mellitus without complications: Secondary | ICD-10-CM | POA: Insufficient documentation

## 2015-11-01 DIAGNOSIS — Z7982 Long term (current) use of aspirin: Secondary | ICD-10-CM | POA: Insufficient documentation

## 2015-11-01 DIAGNOSIS — E785 Hyperlipidemia, unspecified: Secondary | ICD-10-CM | POA: Insufficient documentation

## 2015-11-01 DIAGNOSIS — Z7984 Long term (current) use of oral hypoglycemic drugs: Secondary | ICD-10-CM | POA: Insufficient documentation

## 2015-11-01 LAB — URINALYSIS, REFLEX TO MICROSCOPIC EXAM IF INDICATED
Bilirubin, UA: NEGATIVE
Blood, UA: NEGATIVE
Glucose, UA: >=500 — AB
Ketones UA: NEGATIVE
Leukocyte Esterase, UA: NEGATIVE
Nitrite, UA: NEGATIVE
Protein, UR: NEGATIVE
Specific Gravity UA: 1.018 (ref 1.001–1.035)
Urine pH: 5 (ref 5.0–8.0)
Urobilinogen, UA: NORMAL mg/dL

## 2015-11-01 LAB — CBC AND DIFFERENTIAL
Absolute NRBC: 0 10*3/uL
Basophils Absolute Automated: 0.05 10*3/uL (ref 0.00–0.20)
Basophils Automated: 0.9 %
Eosinophils Absolute Automated: 0.2 10*3/uL (ref 0.00–0.70)
Eosinophils Automated: 3.4 %
Hematocrit: 38.8 % — ABNORMAL LOW (ref 42.0–52.0)
Hgb: 13.2 g/dL (ref 13.0–17.0)
Immature Granulocytes Absolute: 0.02 10*3/uL
Immature Granulocytes: 0.3 %
Lymphocytes Absolute Automated: 1.33 10*3/uL (ref 0.50–4.40)
Lymphocytes Automated: 22.9 %
MCH: 29 pg (ref 28.0–32.0)
MCHC: 34 g/dL (ref 32.0–36.0)
MCV: 85.3 fL (ref 80.0–100.0)
MPV: 10.1 fL (ref 9.4–12.3)
Monocytes Absolute Automated: 0.37 10*3/uL (ref 0.00–1.20)
Monocytes: 6.4 %
Neutrophils Absolute: 3.84 10*3/uL (ref 1.80–8.10)
Neutrophils: 66.1 %
Nucleated RBC: 0 /100 WBC (ref 0.0–1.0)
Platelets: 161 10*3/uL (ref 140–400)
RBC: 4.55 10*6/uL — ABNORMAL LOW (ref 4.70–6.00)
RDW: 14 % (ref 12–15)
WBC: 5.81 10*3/uL (ref 3.50–10.80)

## 2015-11-01 LAB — BASIC METABOLIC PANEL
BUN: 14 mg/dL (ref 9.0–28.0)
CO2: 25 mEq/L (ref 22–29)
Calcium: 8.8 mg/dL (ref 8.5–10.5)
Chloride: 107 mEq/L (ref 100–111)
Creatinine: 1 mg/dL (ref 0.7–1.3)
Glucose: 201 mg/dL — ABNORMAL HIGH (ref 70–100)
Potassium: 4.3 mEq/L (ref 3.5–5.1)
Sodium: 138 mEq/L (ref 136–145)

## 2015-11-01 LAB — GFR: EGFR: >60

## 2015-11-01 MED ORDER — IOHEXOL 240 MG/ML IJ SOLN
50.0000 mL | Freq: Once | INTRAMUSCULAR | Status: AC | PRN
Start: 2015-11-01 — End: 2015-11-01
  Administered 2015-11-01: 50 mL via ORAL
  Filled 2015-11-01: qty 50

## 2015-11-01 MED ORDER — IOHEXOL 350 MG/ML IV SOLN
100.0000 mL | Freq: Once | INTRAVENOUS | Status: AC | PRN
Start: 2015-11-01 — End: 2015-11-01
  Administered 2015-11-01: 100 mL via INTRAVENOUS

## 2015-11-01 MED ORDER — SODIUM CHLORIDE 0.9 % IV BOLUS
1000.0000 mL | Freq: Once | INTRAVENOUS | Status: AC
Start: 2015-11-01 — End: 2015-11-01
  Administered 2015-11-01: 1000 mL via INTRAVENOUS

## 2015-11-01 NOTE — ED Provider Notes (Signed)
[facility] EMERGENCY DEPARTMENT H&P      Visit date: 11/01/2015      CLINICAL SUMMARY          Diagnosis:    .     Final diagnoses:   Abdominal pain, unspecified location         MDM Notes:      Medical Decision Making      Presumptive Diagnosis: abdominal pain NOS    Treatment Plan: home, see patient instructions for treatment and plan    I reviewed the vital signs, nursing notes, past medical history, past surgical history, family history and social history.  No att. providers found    Vital Signs - BP 127/84 mmHg  Pulse 68  Temp(Src) 97.5 F (36.4 C) (Oral)  Resp 18  Ht 6\' 3"  (1.905 m)  Wt 135.626 kg  BMI 37.37 kg/m2  SpO2 99%    Pulse Oximetry Analysis -  Normal    Differential Diagnosis (not completely inclusive): kidney stone, appendicitis, pyelonephritis    Laboratory results reviewed by EDP: Yes  Radiologic study results reviewed by EDP: Yes  Radiologic Studies Interpreted (viewed) by EDP: Yes             Disposition:         Discharge         Discharge Prescriptions     None                      CLINICAL INFORMATION        HPI:      Chief Complaint: Abdominal Pain  .    Francis Trevino is a 60 y.o. male who presents with intermittent RLQ abd pain onset 1700 yesterday while sitting at his desk. Has had a long hx of gallstones and kidney stones, sts that this pain is very similar to the pain he gets from his stones. Approximately 45 mins after onset of pain, he passed gas and the pain improved. Sts that now he has pressure in his RLQ.    Also reports some lower back pain, pt is unsure if it is related.        History obtained from: Patient          ROS:      Positive and negative ROS elements as per HPI.  All other systems reviewed and negative.      Physical Exam:      Pulse 82  BP 160/83 mmHg  Resp 18  SpO2 96 %  Temp 97.5 F (36.4 C)    Physical Exam   Constitutional: He is oriented to person, place, and time. He appears well-developed and well-nourished. No  distress.   NAD   HENT:   Head: Normocephalic and atraumatic.   Mouth/Throat: Oropharynx is clear and moist.   Eyes: Conjunctivae and EOM are normal. Pupils are equal, round, and reactive to light.   Neck: Normal range of motion. Neck supple. No tracheal deviation present.   Cardiovascular: Normal rate, regular rhythm and normal heart sounds.    Pulmonary/Chest: Effort normal and breath sounds normal. No respiratory distress.   Abdominal: Soft. He exhibits no distension. There is tenderness.   RLQ discomfort to palpation, no peritoneal signs   Musculoskeletal: Normal range of motion. He exhibits no edema or tenderness.   Neurological: He is alert and oriented to person, place, and time. Coordination normal.   Skin: Skin is warm and dry. No erythema.   Psychiatric: He has a  normal mood and affect. His behavior is normal. Thought content normal.   Nursing note and vitals reviewed.                PAST HISTORY        Primary Care Provider: Christa See, MD        PMH/PSH:    .     Past Medical History   Diagnosis Date   . Pain in Achilles tendon 2007   . Kidney calculi 05/2010     s/p lithotripsy   . Hypertension    . History of gout    . Calcium oxalate kidney stones 05/2010     s/p lithotripsy   . Arthritis    . Diabetes mellitus    . Hyperlipidemia        He has past surgical history that includes Knee arthroscopy (01/2008); iris nevus (06/1991); Achilles tendon surgery; and Lithotripsy.      Social/Family History:      He reports that he quit smoking about 19 years ago. His smoking use included Cigarettes. He has a 30 pack-year smoking history. He does not have any smokeless tobacco history on file. He reports that he drinks alcohol. He reports that he does not use illicit drugs.    Family History   Problem Relation Age of Onset   . Alzheimer's disease Mother    . Cancer Mother      skin   . Heart disease Father      x5 Bypasses   . Diabetes Father    . Cancer Father      prostate, skin   . Hyperlipidemia Neg Hx     . Kidney disease Neg Hx    . Stroke Neg Hx    . Hypertension Brother    . Diabetes Brother          Listed Medications on Arrival:    .     Home Medications     Last Medication Reconciliation Action:  In Progress Lorenza Chick, RN 11/01/2015  6:39 AM                  aspirin EC 81 MG EC tablet     Take 81 mg by mouth daily.     Diclofenac Sodium (VOLTAREN) 1 % GEL     Apply 2 g topically 4 (four) times daily.     glipiZIDE XL (GLUCOTROL XL) 2.5 MG 24 hr tablet     Take 1 tablet (2.5 mg total) by mouth 2 (two) times daily.     hydroCHLOROthiazide (HYDRODIURIL) 25 MG tablet     TAKE 1 TABLET DAILY     lisinopril (PRINIVIL,ZESTRIL) 20 MG tablet     TAKE 1 TABLET DAILY     metFORMIN (FORTAMET) 1000 MG (OSM) 24 hr tablet     TAKE 1 TABLET TWICE A DAY     Naproxen Sodium (ALEVE PO)     Take by mouth as needed.     simvastatin (ZOCOR) 20 MG tablet     TAKE 1 TABLET NIGHTLY         Allergies: He is allergic to sulfa antibiotics and allopurinol.            VISIT INFORMATION        Clinical Course in the ED:            Medications Given in the ED:    .     ED Medication Orders     Start  Ordered     Status Ordering Provider    11/01/15 [phone] 11/01/15 0647  sodium chloride 0.9 % bolus 1,000 mL   Once     Route: Intravenous  Ordered Dose: 1,000 mL     Last MAR action:  Stopped Sandy Haye TODD            Procedures:      Procedures      Interpretations:      O2 sat-           saturation: 96 %; Oxygen use: room air; Interpretation: Normal                      RESULTS        Lab Results:      Results     Procedure Component Value Units Date/Time    UA, Reflex to Microscopic (pts  3 + yrs) [[id number]]  (Abnormal) Collected:  11/01/15 0648    Specimen Information:  Urine Updated:  11/01/15 0803     Urine Type Clean Catch      Color, UA Yellow      Clarity, UA Clear      Specific Gravity UA 1.018      Urine pH 5.0      Leukocyte Esterase, UA Negative      Nitrite, UA Negative      Protein, UR Negative      Glucose, UA >=500  (A)      Ketones UA Negative      Urobilinogen, UA Normal mg/dL      Bilirubin, UA Negative      Blood, UA Negative      RBC, UA 0 - 2 /hpf      WBC, UA 0 - 5 /hpf     GFR [[id number]] Collected:  11/01/15 0648     EGFR >60.0 Updated:  11/01/15 0755    Basic Metabolic Panel [[id number]]  (Abnormal) Collected:  11/01/15 0648    Specimen Information:  Blood Updated:  11/01/15 0755     Glucose 201 (H) mg/dL      BUN 62.9 mg/dL      Creatinine 1.0 mg/dL      Calcium 8.8 mg/dL      Sodium 528 mEq/L      Potassium 4.3 mEq/L      Chloride 107 mEq/L      CO2 25 mEq/L     CBC with differential [[id number]]  (Abnormal) Collected:  11/01/15 0648    Specimen Information:  Blood from Blood Updated:  11/01/15 0732     WBC 5.81 x10 3/uL      Hgb 13.2 g/dL      Hematocrit 27.2 (L) %      Platelets 161 x10 3/uL      RBC 4.55 (L) x10 6/uL      MCV 85.3 fL      MCH 29.0 pg      MCHC 34.0 g/dL      RDW 14 %      MPV 10.1 fL      Neutrophils 66.1 %      Lymphocytes Automated 22.9 %      Monocytes 6.4 %      Eosinophils Automated 3.4 %      Basophils Automated 0.9 %      Immature Granulocyte 0.3 %      Nucleated RBC 0.0 /100 WBC      Neutrophils Absolute 3.84 x10 3/uL  Abs Lymph Automated 1.33 x10 3/uL      Abs Mono Automated 0.37 x10 3/uL      Abs Eos Automated 0.20 x10 3/uL      Absolute Baso Automated 0.05 x10 3/uL      Absolute Immature Granulocyte 0.02 x10 3/uL      Absolute NRBC 0.00 x10 3/uL               Radiology Results:      CT Abd/Pelvis with IV and PO Contrast   Final Result       1. Normal appendix with no findings to explain the patient's pain.   2. Nonobstructing left renal calculus.   3. Indeterminate left renal lesion. An abdomen MRI, with and without IV   contrast, using the renal mass protocol, is recommended to evaluate on   an outpatient basis.      Bea Laura, MD    11/01/2015 10:09 AM                     Scribe Attestation:      I was acting as a Neurosurgeon for Olevia Bowens, MD on [facility]  Treatment  Team: Scribe: Druckenbrod, Molli Hazard     I am the first provider for this patient and I personally performed the services documented. Treatment Team: Scribe: Druckenbrod, Molli Hazard is scribing for me on [place]. This note and the patient instructions accurately reflect work and decisions made by me.  Olevia Bowens, MD         Olevia Bowens, MD  11/02/15 [phone]

## 2015-11-01 NOTE — ED Notes (Signed)
Pt reports RLQ pain that started yesterday at approx 5 pm, progressively getting worse.  Pt reports hx of kidney stones.

## 2015-11-01 NOTE — ED Notes (Signed)
Assisted patient to the restroom.  Patient's gait was steady and he didn't need any assistance.

## 2015-11-01 NOTE — Discharge Instructions (Signed)
Dear Francis Trevino:    Thank you for choosing the [facility] Emergency Department, the premier emergency department in the [place] area.  I hope your visit today was EXCELLENT.    Specific instructions for your visit today:    Follow up with your primary care doctor. If you do not have one, follow up with the [facility].     Follow up with:       [facility] Referral    [facility] Referral     You have been referred to the [facility] for your follow-up care. Please contact them for assistance at [phone] or [phone].  You can also find them online at:  [email or web address]                  If you do not continue to improve or your condition worsens, please contact your doctor or return immediately to the Emergency Department.    Sincerely,  Olevia Bowens, MD  Attending Emergency Physician  [facility] Emergency Department    ONSITE PHARMACY  Our full service onsite pharmacy is located in the ER waiting room.  Open 7 days a week from 9 am to 11 pm.  We accept all major insurances and prices are competitive with major retailers.  Ask your provider to print your prescriptions down to the pharmacy to speed you on your way home.    OBTAINING A PRIMARY CARE APPOINTMENT    Primary care physicians (PCPs, also known as primary care doctors) are either internists or family medicine doctors. Both types of PCPs focus on health promotion, disease prevention, patient education and counseling, and treatment of acute and chronic medical conditions.    Call for an appointment with a primary care doctor.  Ask to see who is taking new patients.     [facility]  telephone:  [phone]  [email or web address]    DOCTOR REFERRALS  Call [phone] (available 24 hours a day, 7 days a week) if you need any further referrals and we can help you find a primary care doctor or specialist.  Also, available online at:   [email or web address]    YOUR CONTACT INFORMATION  Before leaving please check with registration to make sure we have an up-to-date contact number.  You can call registration at [phone] to update your information.  For questions about your hospital bill, please call [phone].  For questions about your Emergency Dept Physician bill please call [phone].      FREE HEALTH SERVICES  If you need help with health or social services, please call 2-1-1 for a free referral to resources in your area.  2-1-1 is a free service connecting people with information on health insurance, free clinics, pregnancy, mental health, dental care, food assistance, housing, and substance abuse counseling.  Also, available online at:  http://www.[facility].org    MEDICAL RECORDS AND TESTS  Certain laboratory test results do not come back the same day, for example urine cultures.   We will contact you if other important findings are noted.  Radiology films are often reviewed again to ensure accuracy.  If there is any discrepancy, we will notify you.      Please call [phone] to pick up a complimentary CD of any radiology studies performed.  If you or your doctor would like to request a copy of your medical records, please call [phone].      ORTHOPEDIC INJURY   Please know that significant injuries can exist  even when an initial x-ray is read as normal or negative.  This can occur because some fractures (broken bones) are not initially visible on x-rays.  For this reason, close outpatient follow-up with your primary care doctor or bone specialist (orthopedist) is required.    MEDICATIONS AND FOLLOWUP  Please be aware that some prescription medications can cause drowsiness.  Use caution when driving or operating machinery.    The examination and treatment you have received in our Emergency Department is provided on an emergency basis, and is not intended to be a substitute for your primary care physician.   It is important that your doctor checks you again and that you report any new or remaining problems at that time.      24 HOUR PHARMACIES  The nearest 24 hour pharmacy is:    [facility]  [address]  [place], [place] [address]  [phone]      ASSISTANCE WITH INSURANCE    Affordable Care Act  [facility])  Call to start or finish an application, compare plans, enroll or ask a question.  [phone]  TTY: [phone]  Web:  Healthcare.gov    Help Enrolling in [facility]  [employer] [place]  [phone] (TOLL-FREE)  201-259-7045 (TTY)  Web:  Http://www.coverva.org    Local Help Enrolling in the [facility]  [place] [facility]  [phone] (MAIN)  Email:  health-help@nvfs .org  Web:  BlackjackMyths.is  Address:  [address] [place], [place] [address]    SEDATING MEDICATIONS  Sedating medications include strong pain medications (e.g. narcotics), muscle relaxers, benzodiazepines (used for anxiety and as muscle relaxers), Benadryl/diphenhydramine and other antihistamines for allergic reactions/itching, and other medications.  If you are unsure if you have received a sedating medication, please ask your physician or nurse.  If you received a sedating medication: DO NOT drive a car. DO NOT operate machinery. DO NOT perform jobs where you need to be alert.  DO NOT drink alcoholic beverages while taking this medicine.     If you get dizzy, sit or lie down at the first signs. Be careful going up and down stairs.  Be extra careful to prevent falls.     Never give this medicine to others.     Keep this medicine out of reach of children.     Do not take or save old medicines. Throw them away when outdated.     Keep all medicines in a cool, dry place. DO NOT keep them in your bathroom medicine cabinet or in a cabinet above the stove.    MEDICATION REFILLS  Please be aware that we cannot refill any prescriptions through the ER. If you need further treatment from what is provided at your  ER visit, please follow up with your primary care doctor or your pain management specialist.    FREESTANDING EMERGENCY DEPARTMENTS OF [facility]  Did you know [facility] has two freestanding ERs located just a few miles away?   ER of [place] [facility] [place] [facility] [place] have short wait times, easy free parking directly in front of the building and top patient satisfaction scores - and the same Board Certified Emergency Medicine doctors as [facility].              Francis Trevino  [id number]  [id number]  [phone]  11/01/2015    Discharge Instructions    As always, you are the most important factor in your recovery.  Please follow  these instructions carefully.  If you have problems that we have not discussed, CALL OR VISIT YOUR DOCTOR RIGHT AWAY.     If you can't reach your doctor, return to the emergency department.    I Francis Trevino understand the written and discussed instructions.  My questions have been answered.  I acknowledge receipt of these instructions.     Patient or responsible person:         Patient's Signature               Physician or Nurse

## 2015-12-22 ENCOUNTER — Emergency Department
Admission: EM | Admit: 2015-12-22 | Discharge: 2015-12-22 | Disposition: A | Discharge status: Home / Self Care | Attending: Emergency Medicine | Admitting: Emergency Medicine

## 2015-12-22 ENCOUNTER — Emergency Department

## 2015-12-22 DIAGNOSIS — W500XXA Accidental hit or strike by another person, initial encounter: Secondary | ICD-10-CM | POA: Insufficient documentation

## 2015-12-22 DIAGNOSIS — Z87891 Personal history of nicotine dependence: Secondary | ICD-10-CM | POA: Insufficient documentation

## 2015-12-22 DIAGNOSIS — Z7984 Long term (current) use of oral hypoglycemic drugs: Secondary | ICD-10-CM | POA: Insufficient documentation

## 2015-12-22 DIAGNOSIS — Y93E1 Activity, personal bathing and showering: Secondary | ICD-10-CM | POA: Insufficient documentation

## 2015-12-22 DIAGNOSIS — Z7982 Long term (current) use of aspirin: Secondary | ICD-10-CM | POA: Insufficient documentation

## 2015-12-22 DIAGNOSIS — E119 Type 2 diabetes mellitus without complications: Secondary | ICD-10-CM | POA: Insufficient documentation

## 2015-12-22 DIAGNOSIS — Z87442 Personal history of urinary calculi: Secondary | ICD-10-CM | POA: Insufficient documentation

## 2015-12-22 DIAGNOSIS — E785 Hyperlipidemia, unspecified: Secondary | ICD-10-CM | POA: Insufficient documentation

## 2015-12-22 DIAGNOSIS — I10 Essential (primary) hypertension: Secondary | ICD-10-CM | POA: Insufficient documentation

## 2015-12-22 DIAGNOSIS — S92424A Nondisplaced fracture of distal phalanx of right great toe, initial encounter for closed fracture: Secondary | ICD-10-CM | POA: Insufficient documentation

## 2015-12-22 DIAGNOSIS — M1990 Unspecified osteoarthritis, unspecified site: Secondary | ICD-10-CM | POA: Insufficient documentation

## 2015-12-22 MED ORDER — IBUPROFEN 600 MG PO TABS
600.0000 mg | ORAL_TABLET | Freq: Once | ORAL | Status: AC
Start: 2015-12-22 — End: 2015-12-22
  Administered 2015-12-22: 600 mg via ORAL
  Filled 2015-12-22: qty 1

## 2015-12-22 MED ORDER — IBUPROFEN 600 MG PO TABS
600.0000 mg | ORAL_TABLET | Freq: Three times a day (TID) | ORAL | 0 refills | Status: DC | PRN
Start: 2015-12-22 — End: 2016-04-01

## 2015-12-22 NOTE — ED Triage Notes (Signed)
Bent right great toe backwards accidentally. Painful and swollen now.

## 2015-12-22 NOTE — ED Provider Notes (Signed)
EMERGENCY DEPARTMENT HISTORY AND PHYSICAL EXAM     Physician/Midlevel provider first contact with patient: 12/22/15 1341         Date: 12/22/2015  Patient Name: Francis Trevino    History of Presenting Illness     Chief Complaint   Patient presents with   . R great toe injury         History Provided By: Patient    Chief Complaint: R great toe pain s/p inj  Onset: 1 hour PTA  Timing: Acute  Duration: Constant  Location: R toe  Quality: throbbing  Severity: moderate  Modifying Factors: worse with palpation   Associated Symptoms: No numbness or weakness in the foot or toe, no ankle or leg pain      Additional History: Francis Trevino is a 60 y.o. male with no significant past history who presents with 1 hour history of acute, throbbing, moderate, R great toe pain s/p injury.  Patient reports getting out the shower too quickly and stepped on foot with R great toe folded under his foot.  Symptoms are associated with bruising but no numbness or weakness in foot or ankle.    PCP: Pcp, Noneorunknown, MD      No current facility-administered medications for this encounter.      Current Outpatient Prescriptions   Medication Sig Dispense Refill   . aspirin EC 81 MG EC tablet Take 81 mg by mouth daily.     . Diclofenac Sodium (VOLTAREN) 1 % GEL Apply 2 g topically 4 (four) times daily. 100 g 3   . glipiZIDE XL (GLUCOTROL XL) 2.5 MG 24 hr tablet Take 1 tablet (2.5 mg total) by mouth 2 (two) times daily. 90 tablet 1   . hydroCHLOROthiazide (HYDRODIURIL) 25 MG tablet TAKE 1 TABLET DAILY 90 tablet 0   . ibuprofen (ADVIL,MOTRIN) 600 MG tablet Take 1 tablet (600 mg total) by mouth every 8 (eight) hours as needed for Pain. 20 tablet 0   . lisinopril (PRINIVIL,ZESTRIL) 20 MG tablet TAKE 1 TABLET DAILY 30 tablet 0   . metFORMIN (FORTAMET) 1000 MG (OSM) 24 hr tablet TAKE 1 TABLET TWICE A DAY 180 tablet 0   . Naproxen Sodium (ALEVE PO) Take by mouth as needed.     . simvastatin (ZOCOR) 20 MG tablet TAKE 1 TABLET NIGHTLY 30 tablet 0        Past History     Past Medical History:  Past Medical History:   Diagnosis Date   . Arthritis    . Calcium oxalate kidney stones 05/2010    s/p lithotripsy   . Diabetes mellitus    . History of gout    . Hyperlipidemia    . Hypertension    . Kidney calculi 05/2010    s/p lithotripsy   . Pain in Achilles tendon 2007       Past Surgical History:  Past Surgical History:   Procedure Laterality Date   . ACHILLES TENDON SURGERY      right   . iris nevus  06/1991    removed   . KNEE ARTHROSCOPY  01/2008    left knee   . LITHOTRIPSY         Family History:  Family History   Problem Relation Age of Onset   . Alzheimer's disease Mother    . Cancer Mother      skin   . Heart disease Father      x5 Bypasses   . Diabetes Father    .  Cancer Father      prostate, skin   . Hypertension Brother    . Diabetes Brother    . Hyperlipidemia Neg Hx    . Kidney disease Neg Hx    . Stroke Neg Hx        Social History:  Social History   Substance Use Topics   . Smoking status: Former Smoker     Packs/day: 1.50     Years: 20.00     Types: Cigarettes     Quit date: 03/20/1996   . Smokeless tobacco: Never Used   . Alcohol use Yes      Comment: Infrequent       Allergies:  Allergies   Allergen Reactions   . Sulfa Antibiotics Hives   . Allopurinol Other (See Comments)     Abdominal pain       Review of Systems     CONSTITUTIONAL: No fevers or chills  SKIN: bruising R great toe  MUSCULOSKELETAL: No back pain, R great toe pain      Physical Exam   BP 135/88   Pulse (!) 103   Temp 98 F (36.7 C) (Oral)   Resp 18   Ht 6\' 3"  (1.905 m)   Wt 133.8 kg   SpO2 95%   BMI 36.87 kg/m     Vital signs have been reviewed  Constitutional: Well appearing, non toxic  HEAD: Normocephalic, atraumatic  NECK: Supple, FROM  CHEST: RR ~ 12  BACK: No evidence of trauma or deformity.  EXT: Normal DP/PT pulses, no calf pain, edema, or asymmetry, soft compartments of the LE, no proximal leg or ankle tenderness, R great toe with tenderness, sts and ecchymosis, no  subungual hematoma.  SKIN: Warm and dry, no rash.  Capillary refill < 2 sec.  NEURO: AxOx3, strength 5/5 B, sensation intact B.      Diagnostic Study Results     Labs -     Results     ** No results found for the last 24 hours. **          Radiologic Studies -   Radiology Results (24 Hour)     Procedure Component Value Units Date/Time    Toe Right 2 + Vw [[id number]] Collected:  12/22/15 1412    Order Status:  Completed Updated:  12/22/15 1417    Narrative:       CLINICAL INDICATION: Injury, right great toe pain    COMPARISON:  None available    INTERPRETATION:   3 views of the    right great toe demonstrate a  fracture through the base of the distal findings the right great toe  with extension to the peak DIP joint. There is no dislocation. There is  moderate to severe narrowing of the interphalangeal joint of the right  great toe. There is mild narrowing of the first MTP joint.      Impression:        Nondisplaced fracture through the base of the distal phalanx  of the right great toe with extension to the DIP joint.    Wyatt Portela, MD   12/22/2015 2:13 PM        .      Medical Decision Making   I am the first provider for this patient.    I reviewed the vital signs, available nursing notes, past medical history, past surgical history, family history and social history.    Vital Signs-Reviewed the patient's vital signs.  Pulse Oximetry Analysis - 95% RA      Old Medical Records: Nursing notes. (Time of Review: 1:48 PM)    ED Course:   2:14 PM - Updated pt on XR. Discussed discharge instructions and all test results with pt. Pt expresses understanding and agrees to follow up. All questions and concerns addressed at this time.  Patient verbalizes understanding of return precautions and importance of follow up.    Provider Notes:     Procedures:  ------------------ PROCEDURE: INITIAL FRACTURE CARE  ------------------    Performed by the emergency provider  Consent:  Informed consent, after discussion of the risks,  benefits, and alternatives to the procedure, was obtained  Indication: The patient has a fracture of the R great toe.     Procedure: The fracture was not displaced and did not require manipulation in the ED.   Assessment: The fracture was appropriately immobilized and was neurovascularly intact at discharge.    ------------------- PROCEDURE: SPLINT APPLICATION  -------------------    Applied by Tech, supervised by emergency provider.   Location: R great toe  Procedure: The area of the splint was appropriately positioned.  Buddy tape and a post op shoe were applied.   Post-procedure: Good position.  Neurovascular status remains intact.  Patient tolerated the procedure well with no immediate complications          Diagnosis     Clinical Impression:   1. Closed nondisplaced fracture of distal phalanx of right great toe, initial encounter        _______________________________    Attestations:  This note is prepared by Luna Kitchens, acting as Scribe for Enriqueta Shutter, MD.    Enriqueta Shutter, MD:  The scribe's documentation has been prepared under my direction and personally reviewed by me in its entirety.  I confirm that the note above accurately reflects all work, treatment, procedures, and medical decision making performed by me.    _______________________________         Isaac Bliss, MD  12/22/15 [phone]

## 2015-12-22 NOTE — Discharge Instructions (Signed)
Phalanx Fracture, Toe    You have been diagnosed with a fracture of your toe.    Your big toe has 2 bones. The other toes each have 3 bones.    A fracture is a break in a bone. It means the same thing as saying a "broken bone." In general, fractures heal over about 6-8 weeks. Over time, the broken area gets stronger than the area around it. Most toe fractures are managed with a dynamic splint. This is also called "buddy taping." A cast or even surgery may be required for fractures that do not line up well. This is especially the case for fractures of the big toe.    Fractures are often treated with medicine to lower pain and splints or casts to reduce movement. They are also treated with Resting, Icing, Compressing and Elevating the injured area. Remember this as "RICE."   REST: Limit the use of the injured body part.   ICE: By applying ice to the affected area, swelling and pain can be reduced. Place some ice cubes in a re-sealable (Ziploc) bag and add some water. Put a thin washcloth between the bag and the skin. Apply the ice bag to the area for at least 20 minutes. Do this at least 4 times per day. Using the ice for longer times and more frequently is OK. NEVER APPLY ICE DIRECTLY TO THE SKIN.   COMPRESS: Compression means to apply pressure around the injured area such as with a splint, cast or an ACE bandage. Compression decreases swelling and improves comfort. Compression should be tight enough to relieve swelling but not so tight as to decrease circulation. Increasing pain, numbness, tingling, or change in skin color, are all signs of decreased circulation.   ELEVATE: Elevate the injured part. For example, a leg can be elevated by placing the leg on a chair while sitting and propped up on pillows while lying down.    You have been placed in a dynamic splint. This type of splinting is also called "buddy taping." The injured toe is taped to the next, uninjured one. This allows for maximum use of the  foot. It also helps prevent stiffness by allowing movement. Use the guidelines below to care for your injury.   Take off the buddy tape every 2-3 days. Then, replace it with fresh tape. Put some cotton or soft gauze between the toes before taping them together.   You should use the buddy taping for at least 2-3 weeks.   You can use a hard-soled shoe for a while. This may be more comfortable. The hard shoe limits bending at the fracture site. You can buy a "post operative or "cast" shoe at a medical supply store.    YOU SHOULD SEEK MEDICAL ATTENTION IMMEDIATELY, EITHER HERE OR AT THE NEAREST EMERGENCY DEPARTMENT, IF ANY OF THE FOLLOWING OCCURS:   Severe increase in pain or swelling in the injured area.   New numbness or tingling in or below the injured area.   A cold toe that seems to have blood supply problems develops.

## 2016-01-07 ENCOUNTER — Other Ambulatory Visit: Payer: Self-pay | Admitting: Otolaryngic Allergy

## 2016-01-07 ENCOUNTER — Ambulatory Visit
Admission: RE | Admit: 2016-01-07 | Discharge: 2016-01-07 | Disposition: A | Discharge status: Home / Self Care | Source: Ambulatory Visit | Attending: Otolaryngic Allergy | Admitting: Otolaryngic Allergy

## 2016-01-07 DIAGNOSIS — J3489 Other specified disorders of nose and nasal sinuses: Secondary | ICD-10-CM | POA: Insufficient documentation

## 2016-01-07 DIAGNOSIS — M778 Other enthesopathies, not elsewhere classified: Secondary | ICD-10-CM | POA: Insufficient documentation

## 2016-01-07 DIAGNOSIS — J329 Chronic sinusitis, unspecified: Secondary | ICD-10-CM | POA: Insufficient documentation

## 2016-01-07 DIAGNOSIS — Q308 Other congenital malformations of nose: Secondary | ICD-10-CM | POA: Insufficient documentation

## 2016-01-07 DIAGNOSIS — J342 Deviated nasal septum: Secondary | ICD-10-CM | POA: Insufficient documentation

## 2016-02-05 ENCOUNTER — Encounter (INDEPENDENT_AMBULATORY_CARE_PROVIDER_SITE_OTHER): Payer: Self-pay | Admitting: "Endocrinology

## 2016-02-11 ENCOUNTER — Encounter (INDEPENDENT_AMBULATORY_CARE_PROVIDER_SITE_OTHER): Payer: Self-pay | Admitting: "Endocrinology

## 2016-02-11 ENCOUNTER — Ambulatory Visit (INDEPENDENT_AMBULATORY_CARE_PROVIDER_SITE_OTHER): Admitting: "Endocrinology

## 2016-02-11 VITALS — BP 123/80 | HR 88 | Ht 75.0 in | Wt 301.0 lb

## 2016-02-11 DIAGNOSIS — D3500 Benign neoplasm of unspecified adrenal gland: Secondary | ICD-10-CM

## 2016-02-11 DIAGNOSIS — E1101 Type 2 diabetes mellitus with hyperosmolarity with coma: Secondary | ICD-10-CM

## 2016-02-11 DIAGNOSIS — E782 Mixed hyperlipidemia: Secondary | ICD-10-CM

## 2016-02-11 DIAGNOSIS — I10 Essential (primary) hypertension: Secondary | ICD-10-CM

## 2016-02-11 MED ORDER — GLUCOSE BLOOD VI STRP
ORAL_STRIP | 1 refills | Status: DC
Start: 2016-02-11 — End: 2016-08-11

## 2016-02-11 MED ORDER — ONETOUCH ULTRASOFT LANCETS MISC
1 refills | Status: DC
Start: 2016-02-11 — End: 2016-08-11

## 2016-02-11 MED ORDER — ONETOUCH VERIO W/DEVICE KIT
PACK | 1 refills | Status: DC
Start: 2016-02-11 — End: 2016-07-10

## 2016-02-11 NOTE — Progress Notes (Signed)
ENDOCRINE  CONSULTATION NOTE    Date Time: 02/11/2016 9:54 AM  Patient Name: Francis Trevino, Francis Trevino    Reason for Referral:     Chief Complaint   Patient presents with   . Diabetes        History of present Illness:   Francis Trevino is a 60  Year old male with underlying DM and hx of adrenal adenoma. Ref[facility] by [facility] for further evaluation and treatment of DM.   During exam by [facility] for clearance and DOT permit physician was concern about hyperglycemia.   In the future he will  follow with Dr Macon Large since his wife is his patient and they liver closer.   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      Onset DM: 4 YEARS AGO DX with DM, before was told had pre DM.   Complications: none   Hospitalization: none for DM.   Regimen DM: Taking metformin 1000mg  1 tab bid. Glipizide 5mg  1 tab with break added 02/03/16. Has been taking that since then.   BG monitoring: none because his meter is not working.   Hypoglycemic events: unaware  Recent a1c: 9.6 on 01/26/16 while only on Metformin and poor diet.  Last ophtalmo evaluation: 1year ago- no retinopathy. Had iris nevus in the past that was removed so follow up periodically.   Exercise: none   Diet: improving. States that was better while working -- used to have food in his truck, didn't snack a lot. Used to drink a lot of soda and now has decreased to 2 diet sodas per day and 2 cup of coffee.   Symptoms : no polyuria, no polydipsia. No chest pain, no numbness, no tingling.   Has been seeing [facility].  As of note he will have sinus surgery- sinusitis problems.   Energy is ok.   Has gained some weight.   Hasn't seen CDE yet.     He also has diagnosis of adrenal adenoma- had evaluation 2 years ago, normal per patient report. Saw Dr Orlene Erm back janc 2014.     Review of Systems:   A comprehensive review of systems was done per hpi, rest ros are neg  Review of Systems   Constitutional: Negative for malaise/fatigue and  weight loss.   Cardiovascular: Negative for chest pain and palpitations.   Gastrointestinal: Negative for heartburn and nausea.   Genitourinary: Negative for dysuria.   Neurological: Negative for dizziness and tremors.   Endo/Heme/Allergies: Negative for polydipsia.       Patient History:     Past Medical History:   Diagnosis Date   . Arthritis    . Calcium oxalate kidney stones 05/2010    s/p lithotripsy   . Diabetes mellitus    . History of gout    . Hyperlipidemia    . Hypertension    . Kidney calculi 05/2010    s/p lithotripsy   . Pain in Achilles tendon 2007       Past Surgical History:   Procedure Laterality Date   . ACHILLES TENDON SURGERY      right   . iris nevus  06/1991    removed   . KNEE ARTHROSCOPY  01/2008    left knee   . LITHOTRIPSY         Social History     Social History   . Marital status: Married     Spouse name: N/A   . Number  of children: N/A   . Years of education: N/A     Occupational History   . Not on file.     Social History Main Topics   . Smoking status: Former Smoker     Packs/day: 1.50     Years: 20.00     Types: Cigarettes     Quit date: 03/20/1996   . Smokeless tobacco: Never Used   . Alcohol use Yes      Comment: Infrequent   . Drug use: No   . Sexual activity: Not on file     Other Topics Concern   . Not on file     Social History Narrative   . No narrative on file       Allergies   Allergen Reactions   . Sulfa Antibiotics Hives   . Allopurinol Other (See Comments)     Abdominal pain       Medications:     Current Outpatient Prescriptions:   .  glipiZIDE XL (GLUCOTROL XL) 2.5 MG 24 hr tablet, Take 1 tablet (2.5 mg total) by mouth 2 (two) times daily., Disp: 90 tablet, Rfl: 1  .  hydroCHLOROthiazide (HYDRODIURIL) 25 MG tablet, TAKE 1 TABLET DAILY, Disp: 90 tablet, Rfl: 0  .  ibuprofen (ADVIL,MOTRIN) 600 MG tablet, Take 1 tablet (600 mg total) by mouth every 8 (eight) hours as needed for Pain., Disp: 20 tablet, Rfl: 0  .  lisinopril (PRINIVIL,ZESTRIL) 20 MG tablet, TAKE 1 TABLET  DAILY, Disp: 30 tablet, Rfl: 0  .  metFORMIN (FORTAMET) 1000 MG (OSM) 24 hr tablet, TAKE 1 TABLET TWICE A DAY, Disp: 180 tablet, Rfl: 0  .  simvastatin (ZOCOR) 20 MG tablet, TAKE 1 TABLET NIGHTLY, Disp: 30 tablet, Rfl: 0      Physical Exam:     Vitals:    02/11/16 0932   BP: 123/80   Pulse: 88   BP 123/80   Pulse 88   Ht 1.905 m (6\' 3" )   Wt 136.5 kg (301 lb)   BMI 37.62 kg/m   BG now 164 fasting.     General appearance - alert, well appearing, and in no distress  Eyes - pupils equal and reactive, extraocular eye movements intact.  Mouth - mucous membranes moist, pharynx normal without lesions  Neck - supple, no significant adenopathy.   Thyroid: normal size thyroid. No thyroid nodule appreciated  Heart - normal rate, regular rhythm, normal S1, S2, no murmurs, rubs, clicks or gallops  Lungs: CTA, no addition sounds.   Abdomen - soft, nontender, nondistended.  Neurological - alert, oriented, normal speech, no focal findings or movement disorder noted  Psych; normal affect and mood.   Extremities - no pedal edema. R big toe callous and "chip bone". Pedal pulses, present  Skin - normal coloration and turgor, no rashes.      Labs:   01/27/16:  a1c 9.6, urine microalb/creat 12.6, AST 17, ALT 14, tc 119, LDL 43, TGL 219.    Assessment:       1. Type 2 dm uncontrolled.   2. hyperlpidemia  3. Essential hypertension  4. Adrenal adenoma- workup in the past negative per Dr Macon Large.   5. Obesity    Plan:   1. Discussed about DM and health implications   2. Rec to monitor BG at least 2 times per day. Will send new meter.   3. Given limited information it is difficult to tell how is his DM control since he is not checking  BG. His BG today is better , furthermore he is trying to change his diet therefore I assume his DM control has improved.   4. Continue same regimen.   5. Cont statin and BP meds.   6. Rev dietary intake for DM patients. Strongly recommend to work with CDE or take classes with Susie   7. Discussed about  yearly ophtalmo screen as well as feet evaluation   8. Rev hypoglycemic symptoms and how to treat.   9. Rev Old records for adrenal adneoma- rec to fu with Dr Macon Large.     Fu as planned with Dr Macon Large, has apt mid January 2018.       Signed by: Almedia Balls    Faxing note per patient request to    Fax  Number for dot letter to be sent to  [phone]

## 2016-02-13 ENCOUNTER — Encounter (INDEPENDENT_AMBULATORY_CARE_PROVIDER_SITE_OTHER): Payer: Self-pay | Admitting: "Endocrinology

## 2016-02-13 ENCOUNTER — Telehealth (INDEPENDENT_AMBULATORY_CARE_PROVIDER_SITE_OTHER): Payer: Self-pay | Admitting: "Endocrinology

## 2016-02-13 NOTE — Telephone Encounter (Signed)
Fax  Number for dot letter to be sent to  [phone]    Please make sure that a cover page is sent and is noted for DOT

## 2016-02-14 ENCOUNTER — Telehealth (INDEPENDENT_AMBULATORY_CARE_PROVIDER_SITE_OTHER): Payer: Self-pay | Admitting: "Endocrinology

## 2016-02-14 LAB — POCT GLUCOSE: Whole Blood Glucose POCT: 164 mg/dL — AB (ref 70–100)

## 2016-02-14 NOTE — Telephone Encounter (Signed)
Please fax my note from 02/11/16   to DOT and send cover page as well.     Fax  Number for dot letter to be sent to  [phone]    Please make sure that a cover page is sent and is noted for DOT

## 2016-02-14 NOTE — Telephone Encounter (Signed)
Letter/Office Note faxed to DOT

## 2016-03-25 ENCOUNTER — Telehealth

## 2016-03-25 NOTE — Pre-Procedure Instructions (Signed)
Will bring CPAP

## 2016-03-31 ENCOUNTER — Encounter
Admission: RE | Disposition: A | Discharge status: Home / Self Care | Payer: Self-pay | Source: Ambulatory Visit | Attending: Otolaryngic Allergy

## 2016-03-31 ENCOUNTER — Ambulatory Visit: Admitting: Anesthesiology

## 2016-03-31 ENCOUNTER — Ambulatory Visit: Admitting: Radiology

## 2016-03-31 ENCOUNTER — Encounter: Payer: Self-pay | Admitting: Anesthesiology

## 2016-03-31 ENCOUNTER — Ambulatory Visit: Admitting: Otolaryngic Allergy

## 2016-03-31 ENCOUNTER — Ambulatory Visit
Admission: RE | Admit: 2016-03-31 | Discharge: 2016-04-01 | Disposition: A | Discharge status: Home / Self Care | Source: Ambulatory Visit | Attending: Otolaryngic Allergy | Admitting: Otolaryngic Allergy

## 2016-03-31 DIAGNOSIS — Z419 Encounter for procedure for purposes other than remedying health state, unspecified: Secondary | ICD-10-CM | POA: Insufficient documentation

## 2016-03-31 DIAGNOSIS — H905 Unspecified sensorineural hearing loss: Secondary | ICD-10-CM | POA: Insufficient documentation

## 2016-03-31 DIAGNOSIS — R430 Anosmia: Secondary | ICD-10-CM | POA: Insufficient documentation

## 2016-03-31 DIAGNOSIS — I10 Essential (primary) hypertension: Secondary | ICD-10-CM | POA: Insufficient documentation

## 2016-03-31 DIAGNOSIS — J3489 Other specified disorders of nose and nasal sinuses: Secondary | ICD-10-CM | POA: Insufficient documentation

## 2016-03-31 DIAGNOSIS — E119 Type 2 diabetes mellitus without complications: Secondary | ICD-10-CM | POA: Insufficient documentation

## 2016-03-31 DIAGNOSIS — J322 Chronic ethmoidal sinusitis: Secondary | ICD-10-CM | POA: Insufficient documentation

## 2016-03-31 DIAGNOSIS — G4733 Obstructive sleep apnea (adult) (pediatric): Secondary | ICD-10-CM | POA: Insufficient documentation

## 2016-03-31 DIAGNOSIS — J342 Deviated nasal septum: Secondary | ICD-10-CM | POA: Insufficient documentation

## 2016-03-31 DIAGNOSIS — Q382 Macroglossia: Secondary | ICD-10-CM | POA: Insufficient documentation

## 2016-03-31 DIAGNOSIS — E785 Hyperlipidemia, unspecified: Secondary | ICD-10-CM | POA: Insufficient documentation

## 2016-03-31 DIAGNOSIS — R432 Parageusia: Secondary | ICD-10-CM | POA: Insufficient documentation

## 2016-03-31 DIAGNOSIS — J320 Chronic maxillary sinusitis: Secondary | ICD-10-CM | POA: Insufficient documentation

## 2016-03-31 DIAGNOSIS — Z7984 Long term (current) use of oral hypoglycemic drugs: Secondary | ICD-10-CM | POA: Insufficient documentation

## 2016-03-31 DIAGNOSIS — J343 Hypertrophy of nasal turbinates: Secondary | ICD-10-CM | POA: Insufficient documentation

## 2016-03-31 HISTORY — PX: TURBINOPLASTY/SMR: SHX9000

## 2016-03-31 HISTORY — PX: ENDOSCOPIC SINUS SURGERY (FESS): SHX3848

## 2016-03-31 HISTORY — PX: SUSPENSION, HYOID: SHX5562

## 2016-03-31 HISTORY — PX: ETHMOIDECTOMY: SHX3909

## 2016-03-31 LAB — GLUCOSE WHOLE BLOOD - POCT
Whole Blood Glucose POCT: 143 mg/dL — ABNORMAL HIGH (ref 70–100)
Whole Blood Glucose POCT: 180 mg/dL — ABNORMAL HIGH (ref 70–100)
Whole Blood Glucose POCT: 234 mg/dL — ABNORMAL HIGH (ref 70–100)

## 2016-03-31 SURGERY — TURBINOPLASTY WITH SUBMUCOSAL RESECTION
Anesthesia: Anesthesia General | Site: Face | Wound class: Clean Contaminated

## 2016-03-31 MED ORDER — OXYCODONE-ACETAMINOPHEN 5-325 MG PO TABS
ORAL_TABLET | ORAL | Status: AC
Start: 2016-03-31 — End: 2016-03-31
  Administered 2016-03-31: 1 via ORAL
  Filled 2016-03-31: qty 1

## 2016-03-31 MED ORDER — HYDROCODONE-ACETAMINOPHEN 5-325 MG PO TABS
1.0000 | ORAL_TABLET | Freq: Once | ORAL | Status: DC | PRN
Start: 2016-03-31 — End: 2016-04-01

## 2016-03-31 MED ORDER — PROPOFOL 10 MG/ML IV EMUL (WRAP)
INTRAVENOUS | Status: DC | PRN
Start: 2016-03-31 — End: 2016-03-31
  Administered 2016-03-31 (×2): 100 mg via INTRAVENOUS
  Administered 2016-03-31: 50 mg via INTRAVENOUS
  Administered 2016-03-31: 250 mg via INTRAVENOUS

## 2016-03-31 MED ORDER — ONDANSETRON HCL 4 MG/2ML IJ SOLN
INTRAMUSCULAR | Status: AC
Start: 2016-03-31 — End: ?
  Filled 2016-03-31: qty 2

## 2016-03-31 MED ORDER — PHENYLEPHRINE 100 MCG/ML IN NACL 0.9% IV SOSY
PREFILLED_SYRINGE | INTRAVENOUS | Status: AC
Start: 2016-03-31 — End: ?
  Filled 2016-03-31: qty 5

## 2016-03-31 MED ORDER — LISINOPRIL 20 MG PO TABS
20.0000 mg | ORAL_TABLET | Freq: Every day | ORAL | Status: DC
Start: 2016-03-31 — End: 2016-04-01
  Administered 2016-03-31 – 2016-04-01 (×2): 20 mg via ORAL
  Filled 2016-03-31 (×2): qty 1

## 2016-03-31 MED ORDER — FENTANYL CITRATE (PF) 50 MCG/ML IJ SOLN (WRAP)
INTRAMUSCULAR | Status: AC
Start: 2016-03-31 — End: ?
  Filled 2016-03-31: qty 2

## 2016-03-31 MED ORDER — GLIPIZIDE 5 MG PO TABS
5.0000 mg | ORAL_TABLET | Freq: Two times a day (BID) | ORAL | Status: DC
Start: 2016-03-31 — End: 2016-04-01
  Administered 2016-03-31 – 2016-04-01 (×2): 5 mg via ORAL
  Filled 2016-03-31 (×2): qty 1

## 2016-03-31 MED ORDER — PROMETHAZINE HCL 25 MG/ML IJ SOLN
6.2500 mg | Freq: Once | INTRAMUSCULAR | Status: DC | PRN
Start: 2016-03-31 — End: 2016-03-31

## 2016-03-31 MED ORDER — SODIUM CHLORIDE 0.9 % IV SOLN
INTRAVENOUS | Status: DC | PRN
Start: 2016-03-31 — End: 2016-03-31
  Administered 2016-03-31: 30 ug/min via INTRAVENOUS

## 2016-03-31 MED ORDER — OXYCODONE-ACETAMINOPHEN 5-325 MG PO TABS
1.0000 | ORAL_TABLET | ORAL | Status: DC | PRN
Start: 2016-03-31 — End: 2016-04-01
  Administered 2016-03-31 – 2016-04-01 (×3): 1 via ORAL
  Filled 2016-03-31 (×4): qty 1

## 2016-03-31 MED ORDER — LIDOCAINE HCL 2 % IJ SOLN
INTRAMUSCULAR | Status: DC | PRN
Start: 2016-03-31 — End: 2016-03-31
  Administered 2016-03-31: 100 mg

## 2016-03-31 MED ORDER — PROPOFOL 10 MG/ML IV EMUL (WRAP)
INTRAVENOUS | Status: AC
Start: 2016-03-31 — End: ?
  Filled 2016-03-31: qty 20

## 2016-03-31 MED ORDER — COCAINE HCL 4 % EX SOLN
CUTANEOUS | Status: DC | PRN
Start: 2016-03-31 — End: 2016-03-31
  Administered 2016-03-31: 4 mL via TOPICAL

## 2016-03-31 MED ORDER — OXYMETAZOLINE HCL 0.05 % NA SOLN
2.0000 | Freq: Two times a day (BID) | NASAL | Status: DC
Start: 2016-03-31 — End: 2016-04-01
  Administered 2016-03-31 – 2016-04-01 (×3): 2 via NASAL
  Filled 2016-03-31: qty 15

## 2016-03-31 MED ORDER — PROPOFOL 10 MG/ML IV EMUL (WRAP)
INTRAVENOUS | Status: AC
Start: 2016-03-31 — End: ?
  Filled 2016-03-31: qty 40

## 2016-03-31 MED ORDER — PHENYLEPHRINE HCL 10 MG/ML IV SOLN (WRAP)
Status: DC | PRN
Start: 2016-03-31 — End: 2016-03-31
  Administered 2016-03-31: 200 ug via INTRAVENOUS
  Administered 2016-03-31: 100 ug via INTRAVENOUS
  Administered 2016-03-31 (×5): 200 ug via INTRAVENOUS

## 2016-03-31 MED ORDER — METFORMIN HCL 500 MG PO TABS
500.0000 mg | ORAL_TABLET | Freq: Two times a day (BID) | ORAL | Status: DC
Start: 2016-03-31 — End: 2016-04-01
  Administered 2016-03-31 – 2016-04-01 (×2): 500 mg via ORAL
  Filled 2016-03-31 (×2): qty 1

## 2016-03-31 MED ORDER — PHENYLEPHRINE HCL 10 MG/ML IV SOLN (WRAP)
Status: AC
Start: 2016-03-31 — End: ?
  Filled 2016-03-31: qty 2

## 2016-03-31 MED ORDER — HYDROMORPHONE HCL 1 MG/ML IJ SOLN
0.5000 mg | INTRAMUSCULAR | Status: DC | PRN
Start: 2016-03-31 — End: 2016-03-31

## 2016-03-31 MED ORDER — BUPIVACAINE-EPINEPHRINE 0.5% -1:200000 IJ SOLN
INTRAMUSCULAR | Status: DC | PRN
Start: 2016-03-31 — End: 2016-03-31
  Administered 2016-03-31: 30 mL

## 2016-03-31 MED ORDER — SUCCINYLCHOLINE CHLORIDE 20 MG/ML IJ SOLN
INTRAMUSCULAR | Status: DC | PRN
Start: 2016-03-31 — End: 2016-03-31
  Administered 2016-03-31: 120 mg via INTRAVENOUS

## 2016-03-31 MED ORDER — METHYLPREDNISOLONE ACETATE 40 MG/ML IJ SUSP
INTRAMUSCULAR | Status: DC | PRN
Start: 2016-03-31 — End: 2016-03-31
  Administered 2016-03-31: 80 mg via INTRAMUSCULAR

## 2016-03-31 MED ORDER — HYDROCHLOROTHIAZIDE 12.5 MG PO TABS
12.5000 mg | ORAL_TABLET | Freq: Every day | ORAL | Status: DC
Start: 2016-03-31 — End: 2016-04-01
  Administered 2016-04-01: 12.5 mg via ORAL
  Filled 2016-03-31: qty 1

## 2016-03-31 MED ORDER — DEXTROSE IN LACTATED RINGERS 5 % IV SOLN
INTRAVENOUS | Status: DC
Start: 2016-03-31 — End: 2016-04-01

## 2016-03-31 MED ORDER — CEFAZOLIN IN D5W 1 GM/50ML IV SOLN
1.0000 g | Freq: Three times a day (TID) | INTRAVENOUS | Status: DC
Start: 2016-03-31 — End: 2016-04-01
  Administered 2016-03-31 – 2016-04-01 (×2): 1000 mg via INTRAVENOUS
  Filled 2016-03-31 (×3): qty 50

## 2016-03-31 MED ORDER — SIMVASTATIN 10 MG PO TABS
10.0000 mg | ORAL_TABLET | Freq: Every evening | ORAL | Status: DC
Start: 2016-03-31 — End: 2016-04-01
  Administered 2016-03-31: 10 mg via ORAL
  Filled 2016-03-31: qty 1

## 2016-03-31 MED ORDER — METHYLPREDNISOLONE ACETATE 80 MG/ML IJ SUSP
INTRAMUSCULAR | Status: DC | PRN
Start: 2016-03-31 — End: 2016-03-31
  Administered 2016-03-31: 80 mg via INTRAMUSCULAR

## 2016-03-31 MED ORDER — OXYMETAZOLINE HCL 0.05 % NA SOLN
NASAL | Status: AC
Start: 2016-03-31 — End: ?
  Filled 2016-03-31: qty 15

## 2016-03-31 MED ORDER — METHYLPREDNISOLONE ACETATE 40 MG/ML IJ SUSP
INTRAMUSCULAR | Status: AC
Start: 2016-03-31 — End: ?
  Filled 2016-03-31: qty 2

## 2016-03-31 MED ORDER — ONDANSETRON HCL 4 MG/2ML IJ SOLN
INTRAMUSCULAR | Status: DC | PRN
Start: 2016-03-31 — End: 2016-03-31
  Administered 2016-03-31: 4 mg via INTRAVENOUS

## 2016-03-31 MED ORDER — SUCCINYLCHOLINE CHLORIDE 20 MG/ML IJ SOLN
INTRAMUSCULAR | Status: AC
Start: 2016-03-31 — End: ?
  Filled 2016-03-31: qty 10

## 2016-03-31 MED ORDER — LIDOCAINE HCL (PF) 2 % IJ SOLN
INTRAMUSCULAR | Status: AC
Start: 2016-03-31 — End: ?
  Filled 2016-03-31: qty 5

## 2016-03-31 MED ORDER — FENTANYL CITRATE (PF) 50 MCG/ML IJ SOLN (WRAP)
25.0000 ug | INTRAMUSCULAR | Status: AC | PRN
Start: 2016-03-31 — End: 2016-03-31
  Administered 2016-03-31 (×3): 25 ug via INTRAVENOUS

## 2016-03-31 MED ORDER — BACITRACIN ZINC 500 UNIT/GM EX OINT
TOPICAL_OINTMENT | CUTANEOUS | Status: DC | PRN
Start: 2016-03-31 — End: 2016-03-31
  Administered 2016-03-31: 2 g via TOPICAL

## 2016-03-31 MED ORDER — EPHEDRINE SULFATE 50 MG/ML IJ/IV SOLN (WRAP)
Status: AC
Start: 2016-03-31 — End: ?
  Filled 2016-03-31: qty 1

## 2016-03-31 MED ORDER — MIDAZOLAM HCL 2 MG/2ML IJ SOLN
INTRAMUSCULAR | Status: DC | PRN
Start: 2016-03-31 — End: 2016-03-31
  Administered 2016-03-31: 2 mg via INTRAVENOUS

## 2016-03-31 MED ORDER — LACTATED RINGERS IV SOLN
INTRAVENOUS | Status: DC | PRN
Start: 2016-03-31 — End: 2016-04-01

## 2016-03-31 MED ORDER — BACITRACIN ZINC 500 UNIT/GM EX OINT
TOPICAL_OINTMENT | CUTANEOUS | Status: AC
Start: 2016-03-31 — End: ?
  Filled 2016-03-31: qty 28.35

## 2016-03-31 MED ORDER — DEXTROSE 5 % IV SOLN
2.0000 g | Freq: Three times a day (TID) | INTRAVENOUS | Status: DC
Start: 2016-03-31 — End: 2016-03-31
  Administered 2016-03-31: 2 g via INTRAVENOUS

## 2016-03-31 MED ORDER — MIDAZOLAM HCL 2 MG/2ML IJ SOLN
INTRAMUSCULAR | Status: AC
Start: 2016-03-31 — End: ?
  Filled 2016-03-31: qty 2

## 2016-03-31 MED ORDER — CEFAZOLIN SODIUM 1 G IJ SOLR
INTRAMUSCULAR | Status: AC
Start: 2016-03-31 — End: ?
  Filled 2016-03-31: qty 2000

## 2016-03-31 MED ORDER — BUPIVACAINE-EPINEPHRINE (PF) 0.5% -1:200000 IJ SOLN
INTRAMUSCULAR | Status: AC
Start: 2016-03-31 — End: ?
  Filled 2016-03-31: qty 30

## 2016-03-31 MED ORDER — FENTANYL CITRATE (PF) 50 MCG/ML IJ SOLN (WRAP)
INTRAMUSCULAR | Status: AC
Start: 2016-03-31 — End: 2016-03-31
  Administered 2016-03-31: 25 ug via INTRAVENOUS
  Filled 2016-03-31: qty 2

## 2016-03-31 MED ORDER — VASOPRESSIN 20 UNIT/ML IV SOLN
INTRAVENOUS | Status: AC
Start: 2016-03-31 — End: ?
  Filled 2016-03-31: qty 1

## 2016-03-31 MED ORDER — VASOPRESSIN 20 UNIT/ML IV SOLN
INTRAVENOUS | Status: DC | PRN
Start: 2016-03-31 — End: 2016-03-31
  Administered 2016-03-31: 1 [IU] via INTRAVENOUS
  Administered 2016-03-31: 2 [IU] via INTRAVENOUS
  Administered 2016-03-31 (×2): 1 [IU] via INTRAVENOUS
  Administered 2016-03-31: 2 [IU] via INTRAVENOUS

## 2016-03-31 MED ORDER — ONDANSETRON HCL 4 MG/2ML IJ SOLN
4.0000 mg | Freq: Three times a day (TID) | INTRAMUSCULAR | Status: DC | PRN
Start: 2016-03-31 — End: 2016-04-01

## 2016-03-31 MED ORDER — COCAINE HCL 4 % EX SOLN
CUTANEOUS | Status: AC
Start: 2016-03-31 — End: ?
  Filled 2016-03-31: qty 4

## 2016-03-31 MED ORDER — FENTANYL CITRATE (PF) 50 MCG/ML IJ SOLN (WRAP)
INTRAMUSCULAR | Status: DC | PRN
Start: 2016-03-31 — End: 2016-03-31
  Administered 2016-03-31: 50 ug via INTRAVENOUS
  Administered 2016-03-31: 100 ug via INTRAVENOUS

## 2016-03-31 MED ORDER — BUPIVACAINE-EPINEPHRINE (PF) 0.5% -1:200000 IJ SOLN
INTRAMUSCULAR | Status: DC | PRN
Start: 2016-03-31 — End: 2016-03-31
  Administered 2016-03-31: 10 mL

## 2016-03-31 MED ORDER — EPHEDRINE SULFATE 50 MG/ML IJ/IV SOLN (WRAP)
Status: DC | PRN
Start: 2016-03-31 — End: 2016-03-31
  Administered 2016-03-31: 10 mg via INTRAVENOUS

## 2016-03-31 MED ORDER — SEVOFLURANE IN SOLN
RESPIRATORY_TRACT | Status: AC
Start: 2016-03-31 — End: ?
  Filled 2016-03-31: qty 250

## 2016-03-31 SURGICAL SUPPLY — 107 items
BALL COTTON LARGE OD1 1/4 IN (Sponge) ×4 IMPLANT
BALL CTTN LG 1.25IN LF STRL (Sponge) ×6
BLADE S/SU RIBBACK CARB STL 15 (Blade) ×3 IMPLANT
BLADE SHAVER OD4 MM SERRATE IRRIGATION (Blade) ×2 IMPLANT
BLADE SHAVER OD4 MM SERRATE IRRIGATION TUBE OSCILLATE L11 CM XPS (Blade) ×2 IMPLANT
BLADE SHVR XPS 4MM 11CM SRR IRR TUBE (Blade) ×3
CATH  RELIEVA SPIN BLN 6X16MM (Catheter Micellaneous)
CATH  RELIEVA SPIN BLN 6X16MM (Catheter Miscellaneous) IMPLANT
CATH RELIEVA BLN SINUS 5X16MM (Balloon) ×3 IMPLANT
COAGULATOR SCT 10FR 3MR LF STRL CBL (Cautery) ×3
COAGULATOR SCT VLAB 10FR 6IN LF STRL (Cautery) ×3
COAGULATOR SUCTION L3 MR FOOTSWITCH (Cautery) ×2 IMPLANT
COAGULATOR SUCTION L3 MR FOOTSWITCH CABLE FLEXIBLE SHAFT OD10 FR (Cautery) ×2 IMPLANT
COAGULATOR SUCTION L6 IN HANDSWITCH CORD (Cautery) ×2 IMPLANT
COAGULATOR SUCTION L6 IN HANDSWITCH CORD THERMAL REDUCTION FLUID (Cautery) ×2 IMPLANT
DEVICE INFL ENCR 26 LF STRL DISP (Procedure Accessories) ×3
DEVICE INFLATION ENCORE (Procedure Accessories) ×2 IMPLANT
DEVICE INFLATION ENCORE 26 20 CC (Procedure Accessories) ×2 IMPLANT
DRAIN PENROSE 1/4IN X 18IN (Drain) ×3 IMPLANT
DRAPE C-ARM (Drape) ×6 IMPLANT
DRESSING NASAL NASOPORE L4 CM (Dressing) ×2 IMPLANT
DRESSING NASAL NASOPORE L4 CM BIORESORBABLE FIRM (Dressing) ×2 IMPLANT
DRESSING NSL NASOPORE 4CM LF STRL (Dressing) ×3
DRESSING SURG PATTY .5X3IN (Sponge) IMPLANT
DRESSING TEGADERM 2X2 3/4 (Dressing) ×3 IMPLANT
DRESSING TELFA 3X8IN STERILE (Dressing) ×6 IMPLANT
ELECTRODE BLADE (Cautery) ×3 IMPLANT
ELECTRODE ELECTROSURGICAL BLADE PENCIL (Cautery) ×2 IMPLANT
ELECTRODE ELECTROSURGICAL BLADE PENCIL L10 FT VALLEYLAB E2515H (Cautery) ×2 IMPLANT
ELECTRODE ESURG SS BLDE PNCL VLAB 10FT (Cautery) ×3
GLOVE SRG PLISPRN 8 BGL PI ULTRATOUCH G (Glove) ×3
GLOVE SURG BIOGEL LF SZ8 (Glove) ×9 IMPLANT
GLOVE SURGICAL 8 BIOGEL PI ULTRATOUCH G (Glove) ×2 IMPLANT
GLOVE SURGICAL 8 BIOGEL PI ULTRATOUCH G POWDER FREE ROUGH BEAD CUFF (Glove) ×2 IMPLANT
GOWN OPTIMA STRL BACK OR (Gown) ×6 IMPLANT
GOWN SMART IMPERVIOUS LARGE (Gown) ×3 IMPLANT
GUIDEWIRE ORTH ACCLARENT NAVWIRE LF STRL (Guide Wire) ×3 IMPLANT
GUIDEWIRE ORTHOPEDIC ACCLARENT NAVWIRE NAVIGATION SINUS (Guide Wire) ×2 IMPLANT
HANDLES SIDKICK CATH (Procedure Accessories) ×3 IMPLANT
HOLDER DRESSING L16 IN ADJUSTABLE (Patient Supply) ×2 IMPLANT
HOLDER DRESSING L16 IN ADJUSTABLE EARLOOP ADHESIVE TAPE DALE NASAL (Patient Supply) ×2 IMPLANT
HOLDER DRSG PLS FBR 16IN LF NS ADJ EARLP (Patient Supply) ×3
KIT OC ENCR SPNS ADJ KNTLS BN ANCH TNG (Anchor) ×3 IMPLANT
KIT ORAL CARE SUSPENSION ADJUSTABLE (Anchor) ×2 IMPLANT
KIT ORAL CARE SUSPENSION ADJUSTABLE KNOTLESS BONE ANCHOR ENCORE TONGUE (Anchor) ×2 IMPLANT
NEEDLE DISP 27GX1.5IN (Needles) ×6 IMPLANT
NEEDLE HPO SS PP STD MNJCT 27GA 1.5IN LF (Needles) ×9 IMPLANT
NEEDLE HYPODERMIC L1 1/2 IN OD27 GA STANDARD MONOJECT STAINLESS STEEL (Needles) ×6 IMPLANT
PACK MINOR (Pack) ×6 IMPLANT
PACK SRG ECLPS LF STRL ENT I (Pack) ×6
PACK SURGICAL CUSTOM ECLIPSE EENT I (Pack) ×4 IMPLANT
PACK SURGICAL ENT I MEDLINE (Pack) ×4 IMPLANT
PACKING NASAL L8 CM FIRMNESS NASOPORE (Packing) ×2 IMPLANT
PACKING NSL NASOPORE 8CM LF STRL (Packing) ×3
PAD ELECTROSRG GRND REM W CRD (Procedure Accessories) ×6 IMPLANT
RETRIEVER SUTURE ARTHRO STRL (Ortho Supply) ×3
RETRIEVER SUTURE STRAIGHT ISOMETRY (Ortho Supply) ×2 IMPLANT
RETRIEVER SUTURE STRAIGHT ISOMETRY ROTATOR CUFF ISOTAC (Ortho Supply) ×2 IMPLANT
SET XTN 3.9ML MEDEX 34IN LF STRL M LL (IV Supply) ×3 IMPLANT
SHEATH ENDOSCOPIC CLEANING OD4 MM 0 D (Sheath) ×2 IMPLANT
SHEATH ENDOSCOPIC CLEANING OD4 MM 0 D ENDO-SCRUB 2 STORZ (Sheath) ×2 IMPLANT
SHEATH ESCP CLN 0D ESCRB2 4MM STRZ (Sheath) ×3
SOL IRR 0.9% NACL 500ML PLS PR BTL ISTNC (Irrigation Solutions) ×1
SOLUTION IRR 0.9% NACL 500ML LF STRL PLS (Irrigation Solutions) ×2
SOLUTION IRRIGATION 0.9% SDM CHLORIDE 500ML PR BTTL ISOTONIC NONPRGNC (Irrigation Solutions) ×2 IMPLANT
SOLUTION IRRIGATION 0.9% SODIUM CHLORIDE (Irrigation Solutions) ×2 IMPLANT
SPLINT DOYLE II INTRANASAL (Splint) IMPLANT
SPONGE GAUZE L2 IN X W2 IN 3 PLY HIGH (Sponge) ×2 IMPLANT
SPONGE GAUZE L2 IN X W2 IN 4 PLY HIGH (Sponge) ×4 IMPLANT
SPONGE GAUZE L2 IN X W2 IN 4 PLY HIGH ABSORBENT NONWOVEN LINT FREE (Sponge) ×4 IMPLANT
SPONGE GAUZE L4 IN X W4 IN 12 PLY (Sponge) ×2 IMPLANT
SPONGE GZE PLS CTTN CRTY 4X4IN LF STRL (Sponge) ×3
SPONGE GZE RYN PLSTR CRTY VRSLN 2X2IN LF (Sponge) ×3
SPONGE GZE RYN PLSTR KDL CRTY 2X2IN LF (Sponge) ×6
SPONGE SRG VISTEC 8X4IN LF STRL 12 PLY (Sponge) ×6
SPONGE SURGICAL L8 IN X W4 IN 12 PLY (Sponge) ×4 IMPLANT
SPONGE SURGICAL L8 IN X W4 IN 12 PLY RADIOPAQUE BAND VISTEC BLUE WHITE (Sponge) ×4 IMPLANT
SUCTION COAGULATOR FOOT CNTRL (Suction) ×3 IMPLANT
SUTURE ABS CR 4-0 PS2 MTPS 18IN MFL BRN (Suture) ×3
SUTURE CHROMIC GUT CHROMIC 4-0 PS-2 L18 (Suture) ×2 IMPLANT
SUTURE PASSER REVOLUTION (Suture) ×3
SUTURE PASSER REVOLUTION EYELET (Suture) ×2 IMPLANT
SUTURE PASSER REVOLUTION EYELET ERGONOMIC HANDLE OD12 GA (Suture) ×2 IMPLANT
SUTURE PLAIN 3-0 PS2 27IN YEL (Suture) ×3 IMPLANT
SUTURE VICRYL 4-0 PS2 27IN (Suture) ×6 IMPLANT
SYRINGE 10 ML CONTROL CONCENTRIC TIP (Syringes, Needles) ×4 IMPLANT
SYRINGE 10 ML CONTROL CONCENTRIC TIP PYROGEN FREE DEHP FREE LOK (Syringes, Needles) ×4 IMPLANT
SYRINGE MED 10ML LL LF STRL CNTRL CONC (Syringes, Needles) ×6
SYSTEM IRR MTL VTVU YNKR 12FR LF STRL (Suction) ×3 IMPLANT
SYSTEM IRRIGATION EXTEND SUCTION ILLUMINATION TUBE BULB TIP VITAL-VUE (Suction) ×2 IMPLANT
TAPE MICROFOAM 3IN TAPE (Tape) ×3 IMPLANT
TIP SCT TPR BLBS MDVC YNKR LF STRL CNTRL (Suction) ×3
TIP SUCTION MEDI-VAC YANKAUER TAPER (Suction) ×2 IMPLANT
TIP SUCTION MEDI-VAC YANKAUER TAPER BULBOUS CONTROL VENT HANDLE (Suction) ×2 IMPLANT
TOWEL L26 IN X W17 IN COTTON PREWASH (Procedure Accessories) ×4 IMPLANT
TOWEL L26 IN X W17 IN COTTON PREWASH DELINT BLUE ACTISORB DELUXE (Procedure Accessories) ×2 IMPLANT
TOWEL L26 IN X W17 IN COTTON PREWASH DELINT BLUE ACTISORB SURGICAL (Procedure Accessories) ×4 IMPLANT
TOWEL SRG CTTN 26X17IN LF STRL PREWASH (Procedure Accessories) ×9 IMPLANT
TRAY SKIN BETANDINE PREP (Tray) ×6 IMPLANT
TUBING CONNECTING STERILE 10FT (Tubing) ×2
TUBING SCT MDVC MXGR 9/32IN 12FT LF STRL (Suction) ×3
TUBING SCT PVC ARG 3/16IN 10FT LF STRL (Tubing) ×4
TUBING SUCTION ID3/16 IN L10 FT (Tubing) ×4 IMPLANT
TUBING SUCTION ID3/16 IN L10 FT NONCONDUCTIVE STRAIGHT MALE FEMALE (Tubing) ×4 IMPLANT
TUBING SUCTION ID9/32 IN L12 FT (Suction) ×2 IMPLANT
TUBING SUCTION ID9/32 IN L12 FT NONCONDUCTIVE MALE TO MALE CONNECTOR (Suction) ×2 IMPLANT
VIAL DECANTER STRL (Procedure Accessories) ×3 IMPLANT

## 2016-03-31 NOTE — Progress Notes (Signed)
Neck circumference in PACU at 1600 hrs 21.5 inches done over lower part of surgical dressing.

## 2016-03-31 NOTE — Progress Notes (Addendum)
Received pt from Francis Trevino at 1700 Rn in stable condition, VSS, no distress, being medicated for pain, placed 2x2 gauze and nasal drip pad under nose as pt has expected mild bloody drainage, pt states pain is tolerable at 3/10, IV patent and gauze/white foam tape dressing to anterior neck CDI, report given to receiving Rn Justina, pt transported with neck tray.

## 2016-03-31 NOTE — Anesthesia Preprocedure Evaluation (Signed)
Anesthesia Evaluation    AIRWAY    Mallampati: II    TM distance: >3 FB  Neck ROM: full  Mouth Opening:full   CARDIOVASCULAR    cardiovascular exam normal, regular and normal       DENTAL         PULMONARY    pulmonary exam normal and clear to auscultation     OTHER FINDINGS    Permanent bridge bottom          Relevant Problems   (+) Heart murmur   (+) Mild obstructive sleep apnea   (+) Recurrent nephrolithiasis   (+) Renal lesion   (+) Type II diabetes mellitus, uncontrolled     HTN, HLD, Dm2, History of gout, arthritis.  Obesity, OSA uses CPAP            Anesthesia Plan    ASA 3     general                     intravenous induction   Detailed anesthesia plan: general endotracheal        Post op pain management: per surgeon    informed consent obtained    Plan discussed with CRNA.            Hx of hypotension with anesthesia.  Last dose of Lisinopril yesterday morning, if refractory hypotension in the Or, use Vasopressin.        Signed by: Joya Gaskins 03/31/16 12:21 PM

## 2016-03-31 NOTE — Plan of Care (Signed)
Problem: Moderate/High Fall Risk Score >5  Goal: Patient will remain free of falls  Outcome: Progressing   03/31/16 1753   OTHER   Moderate Risk (6-13) MOD-Initiate Yellow "Fall Risk" magnet communication tool;MOD-Consider activation of bed alarm if appropriate;MOD-Apply bed exit alarm if patient is confused;MOD-Consider a move closer to Nurses Station;MOD-Remain with patient during toileting;MOD-Use of assistive devices-bedside commode if appropriate;MOD-Place Fall Risk level on whiteboard in room       Problem: Pain  Goal: Pain at adequate level as identified by patient  Outcome: Progressing   03/31/16 1839   Goal/Interventions addressed this shift   Pain at adequate level as identified by patient Identify patient comfort function goal;Assess for risk of opioid induced respiratory depression, including snoring/sleep apnea. Alert healthcare team of risk factors identified.;Assess pain on admission, during daily assessment and/or before any "as needed" intervention(s);Reassess pain within 30-60 minutes of any procedure/intervention, per Pain Assessment, Intervention, Reassessment (AIR) Cycle;Evaluate if patient comfort function goal is met;Evaluate patient's satisfaction with pain management progress;Offer non-pharmacological pain management interventions       Problem: Inadequate Airway Clearance  Goal: Patent Airway maintained  Outcome: Progressing   03/31/16 1839   Goal/Interventions addressed this shift   Patent airway maintained  Position patient for maximum ventilatory efficiency;Provide adequate fluid intake to liquefy secretions;Suction secretions as needed;Reinforce use of ordered respiratory interventions (i.e. CPAP, BiPAP, Incentive Spirometer, Acapella, etc.);Reposition patient every 2 hours and as needed unless able to self-reposition   Trach tray at bedside.

## 2016-03-31 NOTE — Progress Notes (Signed)
Patient admitted to 2601 via stretcher with transport. Patient able to ambulate from stretcher to bed. Dressing to nose and neck-c/d/i. Pain 3/10. Patient due to void. Vitals stable. Call bell and phone within reach. Wife at bedside.

## 2016-03-31 NOTE — Interval H&P Note (Signed)
Pt seen and examined no change in HA dn P

## 2016-03-31 NOTE — Transfer of Care (Signed)
Anesthesia Transfer of Care Note    Patient: Francis Trevino    Procedures performed: Procedure(s) with comments:  TURBINATES RESECTIONS USING SUBMUCOSAL APPROACH, SEPTO/SMR W/WO CARTILAGE SCORING/CONTOURING /GRAFT  ENDOSCOPIC ETHMOIDECTOMY, TOTAL ANTERIOR AND POSTERIOR  ENDOSCOPIC SINUS SURGERY (FESS), MAXILLARY ANTROSTOMY WITH MAXILLARY TISSUE REMOVAL - Stereolactice computer assisted (navigational) cranial, extradural (image guidance for sinus surgery)  SUSPENSION, HYOID MYOTOMY    Anesthesia type: General ETT    Patient location:Phase I PACU    Last vitals:   Vitals:    03/31/16 1601   BP: 119/69   Pulse: 79   Resp: 12   Temp: 36.1 C (97 F)   SpO2: 97%       Post pain: Patient not complaining of pain, continue current therapy      Mental Status:awake    Respiratory Function: tolerating face mask    Cardiovascular: stable    Nausea/Vomiting: patient not complaining of nausea or vomiting    Hydration Status: adequate    Post assessment: no apparent anesthetic complications, no reportable events and no evidence of recall    Signed by: Clemens Catholic  03/31/16 4:01 PM

## 2016-03-31 NOTE — Anesthesia Postprocedure Evaluation (Signed)
Anesthesia Post Evaluation    Patient: Francis Trevino    Procedures performed: Procedure(s) with comments:  TURBINATES RESECTIONS USING SUBMUCOSAL APPROACH, SEPTO/SMR W/WO CARTILAGE SCORING/CONTOURING /GRAFT  ENDOSCOPIC ETHMOIDECTOMY, TOTAL ANTERIOR AND POSTERIOR  ENDOSCOPIC SINUS SURGERY (FESS), MAXILLARY ANTROSTOMY WITH MAXILLARY TISSUE REMOVAL - Stereolactice computer assisted (navigational) cranial, extradural (image guidance for sinus surgery)  SUSPENSION, HYOID MYOTOMY    Anesthesia type: General ETT    Patient location:PACU    Last vitals:   Vitals:    03/31/16 1753   BP: 127/79   Pulse: 89   Resp: 18   Temp: (!) 36 C (96.8 F)   SpO2: 95%       Post pain: Patient not complaining of pain, continue current therapy      Mental Status:awake and alert     Respiratory Function: tolerating room air    Cardiovascular: stable    Nausea/Vomiting: patient not complaining of nausea or vomiting    Hydration Status: adequate    Post assessment: no apparent anesthetic complications    Signed by: Manuella Ghazi, 03/31/2016 7:57 PM

## 2016-03-31 NOTE — Brief Op Note (Signed)
BRIEF OP NOTE    Date Time: 03/31/16 3:48 PM    Patient Name:   Francis Trevino    Date of Operation:   03/31/2016    Providers Performing:   Surgeon(s):  Jaquelyn Bitter, MD    Assistant (s):   Circulator: Naomie Dean, RN  Relief Circulator: Jacquelin Hawking, RN  Scrub Person: Karle Plumber  Preceptor: Frances Maywood, RN    Operative Procedure:   Procedure(s):  TURBINATES RESECTIONS USING SUBMUCOSAL APPROACH, SEPTO/SMR W/WO CARTILAGE SCORING/CONTOURING /GRAFT  ENDOSCOPIC ETHMOIDECTOMY, TOTAL ANTERIOR AND POSTERIOR  ENDOSCOPIC SINUS SURGERY (FESS), MAXILLARY ANTROSTOMY WITH MAXILLARY TISSUE REMOVAL  SUSPENSION, HYOID MYOTOMY    Preoperative Diagnosis:   Pre-Op Diagnosis Codes:     * OSA (obstructive sleep apnea) [G47.33]     * Anosmia [R43.0]     * Ageusia [R43.2]     * Macroglossia [Q38.2]     * Nasal septal deviation [J34.2]     * Nasal septal spur [J34.89]     * Hypertrophy of both inferior nasal turbinates [J34.3]     * Sensorineural hearing loss (SNHL) of both ears [H90.3]     * Chronic ethmoidal sinusitis [J32.2]     * Chronic maxillary sinusitis [J32.0]    Postoperative Diagnosis:   Post-Op Diagnosis Codes:     * OSA (obstructive sleep apnea) [G47.33]     * Anosmia [R43.0]     * Ageusia [R43.2]     * Macroglossia [Q38.2]     * Nasal septal deviation [J34.2]     * Nasal septal spur [J34.89]     * Hypertrophy of both inferior nasal turbinates [J34.3]     * Sensorineural hearing loss (SNHL) of both ears [H90.3]     * Chronic ethmoidal sinusitis [J32.2]     * Chronic maxillary sinusitis [J32.0]    Anesthesia:   General    Estimated Blood Loss:   min    Implants:     Implant Name Type Inv. Item Serial No. Manufacturer Lot No. LRB No. Used Action   KIT ENCORE TONGUE SUSPNSN - [id number] Anchor KIT ENCORE TONGUE SUSPNSN   [employer] [id number] N/A 1 Implanted       Drains:   none    Specimens:   none    Findings:   Low hyoid DNS    Complications:   none      Signed by: Jaquelyn Bitter, MD

## 2016-04-01 LAB — GLUCOSE WHOLE BLOOD - POCT: Whole Blood Glucose POCT: 187 mg/dL — ABNORMAL HIGH (ref 70–100)

## 2016-04-01 MED ORDER — CEFAZOLIN IN D5W 1 GM/50ML IV SOLN
1.0000 g | Freq: Three times a day (TID) | INTRAVENOUS | Status: DC
Start: 2016-04-01 — End: 2016-04-01

## 2016-04-01 NOTE — UM Notes (Signed)
Retro review 04/01/16@1154     03/31/16 1548  Place (admit) for Observation Services Once         The patient has a history of nasal obstruction, mouth breathing, chronic  rhinosinusitis, and obstructive sleep apnea and was unable to tolerate a  CPAP device.  He had mouth breathing, snoring, and then symptomatic  problems from the CPAP.  We talked about the various surgical and  nonsurgical treatments for sleep apnea.  We talked about the nasal  obstruction being an independent variable      Date of Operation:   03/31/2016  Preoperative Diagnosis:   Pre-Op Diagnosis Codes:     * OSA (obstructive sleep apnea) [G47.33]     * Anosmia [R43.0]     * Ageusia [R43.2]     * Macroglossia [Q38.2]     * Nasal septal deviation [J34.2]     * Nasal septal spur [J34.89]     * Hypertrophy of both inferior nasal turbinates [J34.3]     * Sensorineural hearing loss (SNHL) of both ears [H90.3]     * Chronic ethmoidal sinusitis [J32.2]     * Chronic maxillary sinusitis [J32.0]    Postoperative Diagnosis:   Post-Op Diagnosis Codes:     * OSA (obstructive sleep apnea) [G47.33]     * Anosmia [R43.0]     * Ageusia [R43.2]     * Macroglossia [Q38.2]     * Nasal septal deviation [J34.2]     * Nasal septal spur [J34.89]     * Hypertrophy of both inferior nasal turbinates [J34.3]     * Sensorineural hearing loss (SNHL) of both ears [H90.3]     * Chronic ethmoidal sinusitis [J32.2]     * Chronic maxillary sinusitis [J32.0]  Findings:   Low hyoid DNS    Complications:   None      04/01/16    BP 135/81   Pulse 82   Temp 97.1 F (36.2 C) (Oral)   Resp 18   Ht 1.905 m (6\' 3" )   Wt 143.9 kg (317 lb 3.2 oz)   SpO2 94%   BMI 39.65 kg/m   Ranges for the last 24 hours:  Temp:  [96.8 F (36 C)-99 F (37.2 C)] 97.1 F (36.2 C)  Heart Rate:  [79-103] 82  Resp Rate:  [12-20] 18  BP: (119-168)/(58-98) 135/81    Date of Admission:   03/31/2016    Date of Discharge:   04/01/2016    Outcome of Hospitalization:               Lab Results last 48  Hours     Procedure Component Value Units Date/Time    Glucose Whole Blood - POCT [[id number]]  (Abnormal) Collected:  04/01/16 0635     Updated:  04/01/16 0642     POCT - Glucose Whole blood 187 (H) mg/dL     Glucose Whole Blood - POCT [[id number]]  (Abnormal) Collected:  03/31/16 2144     Updated:  03/31/16 2148     POCT - Glucose Whole blood 234 (H) mg/dL     Glucose Whole Blood - POCT [[id number]]  (Abnormal) Collected:  03/31/16 1608     Updated:  03/31/16 1650     POCT - Glucose Whole blood 180 (H) mg/dL     Glucose Whole Blood - POCT [[id number]]  (Abnormal) Collected:  03/31/16 1200     Updated:  03/31/16 1202     POCT - Glucose Whole blood 143 (  H) mg/dL    good       Procedures performed:   surgery    Treatment Team:   Attending Provider: Jaquelyn Bitter, MD    Disposition:   Disposition: Home or Self Care    Condition at Discharge:        Follow up Recommendations for Receiving Provider     To Dr's office    Discharge Instructions:   pt has written      Signed by: Jaquelyn Bitter, MD    Dispo: Home , no needs        Derrel Nip, RN, BSN  ACM  Utilization Review Case Manager   Case Management Department  [facility]  T [phone]  F [phone]  Please submit all clinical review requests via fax

## 2016-04-01 NOTE — Progress Notes (Signed)
04/01/16 1010   Discharge Disposition   Patient preference/choice provided? N/A   Physical Discharge Disposition Home, No Needs   Mode of Transportation Car   Patient/Family/POA notified of transfer plan Yes   Patient agreeable to discharge plan/expected d/c date? Yes   Bedside nurse notified of transport plan? Yes   CM Interventions   Follow up appointment scheduled? No   Reason no follow up scheduled? Patient declined   Referral made for home health RN visit? No, Other (comment)   Multidisciplinary rounds/family meeting before d/c? Yes   Medicare Checklist   Is this a Medicare patient? No   Janie Morning  Case Management  [phone]

## 2016-04-01 NOTE — Op Note (Signed)
Procedure Date: 03/31/2016     Patient Type: A     SURGEON: Salomon Mast MD  ASSISTANT:       PREOPERATIVE DIAGNOSIS:  Deviated nasal septum, turbinate hypertrophy, maxillary and ethmoid  sinusitis, and obstructive sleep apnea.     POSTOPERATIVE DIAGNOSIS:  Deviated nasal septum, turbinate hypertrophy, maxillary and ethmoid  sinusitis, and obstructive sleep apnea.     TITLE OF PROCEDURE:  Nasal septum reconstruction, bilateral excision of turbinates, bilateral  endoscopic middle meatal antrostomy, anterior and posterior ethmoidectomy  with navigation and hyoid suspension.     ANESTHESIA:  General Anesthesia.     ESTIMATED BLOOD LOSS:min       PATHOLOGY:  Sinus contents sent.     INDICATIONS FOR PROCEDURE:  The patient has a history of nasal obstruction, mouth breathing, chronic  rhinosinusitis, and obstructive sleep apnea and was unable to tolerate a  CPAP device.  He had mouth breathing, snoring, and then symptomatic  problems from the CPAP.  We talked about the various surgical and  nonsurgical treatments for sleep apnea.  We talked about the nasal  obstruction being an independent variable.  After the risks and benefits  were reviewed, short and long-term followup was discussed, and written and  verbal informed consent was obtained.     DESCRIPTION OF PROCEDURE:  The patient was taken to the OR and prepped and draped in the usual fashion  for nasal surgery.  Marcaine 0.5% with 1:200,000 epinephrine was  infiltrated in the standard fashion.  Cocaine on pledgets was placed  intranasally.       Marcaine 0.5% with 1:200,000 epinephrine was infiltrated into the anterior  neck.  Attention was turned towards the anterior neck first.  Dissection  was carried out with a 2-cm incision under the chin.  It was carried to the  posterior table of the mandible.  A 1.5-mm pilot screw was drilled on each  side of the midline and a bone anchor was placed.  A 2-cm incision was made  over the hyoid bone.  The subcutaneous tissue  was defatted.  Bleeding was  controlled with the suction cautery.  The hyoid bone was stabilized using  the tracheostomy hook and a Revolution device was used to pass suture  around the hyoid bone.  The sutures were passed deep to the platysma,  through the wound, under the chin, and through the bone anchors.  Under  fluoroscopic guidance, the hyoid was advanced approximately 18 mm  anteriorly.  The bone anchors were tightened down.  The suture was tied in  the midline.  Excess was cut.  The wounds were irrigated and closed with  deep 3-0 Vicryl, superficial 3-0 Vicryl, and running locking 4-0 plain gut  suture.  Foam tape was applied to the anterior neck.     Attention was turned towards the nasal portion of the procedure.  A left  Killian incision was performed.  Bilateral mucoperichondrial flaps were  elevated.  Portions of the bony and cartilaginous septum were removed,  leaving an anterior strut of cartilage and this brought the septum into  midline.  Some cartilage was returned to the septum.  The Killian incision  was closed with 3-0 plain gut x4.  Two long closing sutures were applied.       Attention was turned towards the endoscopic portion of the procedure.   Using a clamp guidance system, the middle turbinate was located, the  sphenoethmoidal recess was located, and the face of the sphenoid  was  located.     Attention was turned towards the right side.  The left side was treated in  kind.  A standard infundibulotomy was performed.  The posterior fontanelle  was opened with the straight-going Thru-cut forceps and backbiters were  used to enlarge the maxillary ostium.  Using the straight and upgoing  suction Blakesley forceps, posterior dissection was carried out to the face  of the sphenoid.  This was confirmed using the navigation device.  Then, a  posterior anterior dissection was performed.  The skull base was then  located on multiple occasions.  The area was skeletonized widely.  A  NasoPore sponge  was placed in each middle meatus.  Attention was then  turned towards the inferior turbinates.  They were medialized, cauterized,  and resected for the anterior inferior one-third.  The free edge was  cauterized.  The remnant was lateralized.  This was performed bilaterally.   The patient was then returned to anesthesia and the recovery room in good  condition.           D:  03/31/2016 22:08 PM by Dr. Salomon Mast, MD [phone])  T:  04/01/2016 11:42 AM by       (Conf: [id number]) (Doc ID: [id number])

## 2016-04-01 NOTE — Progress Notes (Signed)
Patient alert and oriented. Patient and family educated on and given discharge instructions with understanding noted. Patient to go to follow up appointment after being discharged today. Gauze given for nasal drainage if needed to be changed. Anterior neck dressing clean, dry, and intact at discharge.TELE and PIV removed per protocol. Patient dressed and belongings gathered. Patient states no questions at discharge. VSS. Patient stable at discharge.

## 2016-04-01 NOTE — Final Progress Note (DC Note for stay less than 48 (Signed)
Date:04/01/2016   Patient Name: Francis Trevino  Attending Physician: Jaquelyn Bitter, *  Today:   BP 135/81   Pulse 82   Temp 97.1 F (36.2 C) (Oral)   Resp 18   Ht 1.905 m (6\' 3" )   Wt 143.9 kg (317 lb 3.2 oz)   SpO2 94%   BMI 39.65 kg/m   Ranges for the last 24 hours:  Temp:  [96.8 F (36 C)-99 F (37.2 C)] 97.1 F (36.2 C)  Heart Rate:  [79-103] 82  Resp Rate:  [12-20] 18  BP: (119-168)/(58-98) 135/81    Date of Admission:   03/31/2016    Date of Discharge:   04/01/2016    Outcome of Hospitalization:       Lab Results last 48 Hours     Procedure Component Value Units Date/Time    Glucose Whole Blood - POCT [[id number]]  (Abnormal) Collected:  04/01/16 0635     Updated:  04/01/16 0642     POCT - Glucose Whole blood 187 (H) mg/dL     Glucose Whole Blood - POCT [[id number]]  (Abnormal) Collected:  03/31/16 2144     Updated:  03/31/16 2148     POCT - Glucose Whole blood 234 (H) mg/dL     Glucose Whole Blood - POCT [[id number]]  (Abnormal) Collected:  03/31/16 1608     Updated:  03/31/16 1650     POCT - Glucose Whole blood 180 (H) mg/dL     Glucose Whole Blood - POCT [[id number]]  (Abnormal) Collected:  03/31/16 1200     Updated:  03/31/16 1202     POCT - Glucose Whole blood 143 (H) mg/dL    good       Procedures performed:   surgery    Treatment Team:   Attending Provider: Jaquelyn Bitter, MD    Disposition:   Disposition: Home or Self Care    Condition at Discharge:        Follow up Recommendations for Receiving Provider     To Dr's office    Discharge Instructions:   pt has written      Signed by: Jaquelyn Bitter, MD

## 2016-04-01 NOTE — Plan of Care (Signed)
Patient cleared by MD for discharge.

## 2016-04-01 NOTE — Plan of Care (Addendum)
Problem: Pain  Goal: Pain at adequate level as identified by patient  Outcome: Progressing   04/01/16 0137   Goal/Interventions addressed this shift   Pain at adequate level as identified by patient Identify patient comfort function goal;Assess for risk of opioid induced respiratory depression, including snoring/sleep apnea. Alert healthcare team of risk factors identified.;Assess pain on admission, during daily assessment and/or before any "as needed" intervention(s);Reassess pain within 30-60 minutes of any procedure/intervention, per Pain Assessment, Intervention, Reassessment (AIR) Cycle;Evaluate if patient comfort function goal is met;Evaluate patient's satisfaction with pain management progress;Offer non-pharmacological pain management interventions;Consult/collaborate with Physical Therapy, Occupational Therapy, and/or Speech Therapy;Include patient/patient care companion in decisions related to pain management as needed           Pt alert and oriented x4, S on the monitor, sats 96% on 3 litre NC for sleep apnea, minimal bloody drainage from nose, foam dressing over neck incision neck circumference 21.5 inches, not swelling /no respiratory distress noted.ambulates 30 feet. Pt instructed to call the nurse if having sob. Safety maintained, hourly rounds    Problem: Inadequate Airway Clearance  Goal: Patent Airway maintained  Outcome: Progressing   04/01/16 0137   Goal/Interventions addressed this shift   Patent airway maintained  Position patient for maximum ventilatory efficiency;Provide adequate fluid intake to liquefy secretions;Reinforce use of ordered respiratory interventions (i.e. CPAP, BiPAP, Incentive Spirometer, Acapella, etc.);Reposition patient every 2 hours and as needed unless able to self-reposition

## 2016-04-02 ENCOUNTER — Ambulatory Visit (INDEPENDENT_AMBULATORY_CARE_PROVIDER_SITE_OTHER): Admitting: Endocrinology, Diabetes and Metabolism

## 2016-04-09 ENCOUNTER — Encounter: Payer: Self-pay | Admitting: Otolaryngic Allergy

## 2016-04-22 ENCOUNTER — Encounter (INDEPENDENT_AMBULATORY_CARE_PROVIDER_SITE_OTHER): Payer: Self-pay | Admitting: Endocrinology, Diabetes and Metabolism

## 2016-04-22 ENCOUNTER — Ambulatory Visit (INDEPENDENT_AMBULATORY_CARE_PROVIDER_SITE_OTHER): Admitting: Endocrinology, Diabetes and Metabolism

## 2016-04-22 VITALS — BP 133/86 | HR 97 | Ht 75.0 in | Wt 302.6 lb

## 2016-04-22 DIAGNOSIS — E118 Type 2 diabetes mellitus with unspecified complications: Secondary | ICD-10-CM

## 2016-04-22 DIAGNOSIS — IMO0002 Reserved for concepts with insufficient information to code with codable children: Secondary | ICD-10-CM

## 2016-04-22 DIAGNOSIS — E1165 Type 2 diabetes mellitus with hyperglycemia: Secondary | ICD-10-CM

## 2016-04-22 LAB — POCT GLUCOSE: Whole Blood Glucose POCT: 187 mg/dL — AB (ref 70–100)

## 2016-04-22 LAB — POCT HEMOGLOBIN A1C: POCT Hgb A1C: 7.3 — AB (ref 3.9–5.9)

## 2016-04-22 MED ORDER — METFORMIN HCL 1000 MG PO TABS
1000.0000 mg | ORAL_TABLET | Freq: Two times a day (BID) | ORAL | 1 refills | Status: DC
Start: 2016-04-22 — End: 2016-08-11

## 2016-04-22 MED ORDER — GLIPIZIDE ER 2.5 MG PO TB24
2.5000 mg | ORAL_TABLET | Freq: Every day | ORAL | 1 refills | Status: DC
Start: 2016-04-22 — End: 2016-08-11

## 2016-04-22 MED ORDER — FREESTYLE LITE TEST VI STRP
ORAL_STRIP | 2 refills | Status: DC
Start: 2016-04-22 — End: 2016-07-10

## 2016-04-22 NOTE — Patient Instructions (Signed)
Please note that in the future if you need prescription refills, plan ahead and call our clinic during normal clinic hours only to ensure these get done in a timely fashion.  If you are overdue for your follow up appointment, we may ask that you come in for a visit before refilling the medications.  Thanks for your understanding.    Be sure you arrive to your appointments on time in the future.  If you are late by 10 minutes or more, we may ask you reschedule because it is discourteous to patients on our schedule who do come in on time to ask them to wait because you were late.    Recommended follow-up from today's visit: 4 months      Be sure to bring your glucose meter or blood sugar log next visit for review of sugars.    Diabetes General Instructions:  Take A+ care of yourself!  Failure to "get the results" can lead to the devastating complications of diabetes.  You can help to avoid this misery by following our advice, and by controlling all aspects of your disease to the best of your ability.  I look forward to helping you improve your control.  1.  Meal Plan:   a.  Control your calorie intake to avoid gaining weight.   b.  Please eat 3 regular meals, don't skip. Eat very healthy foods, including vegetables, fruits, whole grains, nuts (in limitation), and fish.  Use olive or canola oil as your oil source.  No fruit juices or regular sodas.   c.  Sodium restriction <1500 mg daily.  2.  Exercise:   Activity:  Consider an activity you can be comfortable with such as walking, jogging, running, swimming, or weights.  Try to walk at least 5 days a week for 30 minutes each day.  Start slowly and gradually build up your stamina.  You can do this.  Find activities that you enjoy!  Be persistent and enthusiastic. Don't injure yourself and do not exercise to the point of exhaustion.  Be very careful.  Consult your MD.  3.  Daily careful shoe and foot exam.  If you cannot see your feet clearly and entirely please either  use a mirror and/or ask a relative to do the exam for you (see below)  4.  Blood Sugar Monitoring:       a. Please check your sugars 1-2 times each day.  Record your glucose values in a log.  Please bring your glucose readings or meter to each and every visit with each of your diabetes care-providers.  If you are checking your sugars twice daily you can vary the times: before breakfast and dinner one day, and before lunch and 2 hours after dinner the next day.  It is important to check your sugars 2 hours after meals to see how high the glucose readings are at this time.  If you are on short acting and long acting insulins, you need to check sugars before each meal and bedtime at the very least.       b.  Ideal blood sugar levels 80 to 130 before and 80 to 160 2hr after meals.  5.  Yearly ophthalmology exam- be sure to have your ophthalmologist forward [place] a copy of his exam report.  See your dentist every 6 months.  Get flu shot yearly.  6.  Labs: typically get comprehensive metabolic panel and fasting lipid panel once per year (if controlled); A1c tests are  done 2-4 times per year (depending on control)  7.  Continued follow-up: every 3 months (or every 6 months if excellent control).  8.  Insulin can be injected anywhere where you can pinch up fat (typically belly, inner thighs or outside of arms).  9.  Most diabetes medications (other than metformin and Actos) can cause low blood sugars.  If you experience symptoms such as shakiness, weakness, confusion, lightheadedness, or sweats, this may indicate your blood sugar is low.  In this situation, please check your blood sugar.  If it is low (<70), have a small snack such as 4 oz of orange juice, 1 cup of milk, 4-5 pieces of hard candy or 3-4 glucose tablets.  Recheck sugar in 15 minutes.  If not improved, can repeat as needed.  10.  Blood pressure surveillance and cholesterol surveillance are also important parts of minimizing the risk for heart disease in the  future.  Typically the goal blood pressure is < 130/80 and goal LDL (bad cholesterol) is <100 (for patient already with heart disease the LDL should be <70).  11.  Regarding medication for your diabetes: continue metformin 1000 mg twice daily with meals and Glipizide 2.5 mg in AM  12.  Diabetes foot care DO's and DON'TS:  DO's:  . Wash your feet daily with warm water & soap.  . Never soak your feet without first checking the temperature of the water by hand  . Dry your feet well, especially between the toes  . If the skin on your feet is dry, apply lotion to them everyday after bathing.  If your feet sweat a large amount, apply powder to them everyday  . Check your feet daily for blisters, cuts, sores, redness or swelling.  Check carefully between your toes.  Use a mirror to check the bottom of your feet.  Notify your doctor right away if you find something wrong  . Use an emery board gently to shape toenails even with the ends of the toes  . File calluses or rough skin with an emery board to remove dead skin.  This should be done after washing the feet to help soften the skin.  . Cut your toenails straight across (to prevent ingrown toenails)  . Wear socks or stockings without seams or bumps that are clean and soft and well fitting  . Keep your feet warm and dry  . Wear shoes that fit well and do not rub toes or heels  . Examine your shoes daily for cracks, pebbles, nails or anything that could hurt your feet  . See your podiatrist (foot doctor) if you have a wound that is not healing    DON'Ts:  . Never walk barefoot indoors or outdoors  . Never use a hot water bottle or heating pad on your feet  . Don't put lotion between your toes  . Never use a knife or razor blade to cut your toenails or feet  . Don't use chemicals or corn and callous removers yourself  . Never rip off a hangnail  . Never wear garters, tight socks or other clothing which cuts off circulation to your feet  . Don't sit with your legs crossed at  the knees as this slows blood flow to your feet  . Don't smoke!

## 2016-04-22 NOTE — Progress Notes (Addendum)
Subjective:       Patient ID: Francis Trevino is a 61 y.o. male.    HPI    Francis Trevino is being self-referred for consultation regarding diabetes.    Francis Trevino is a 61 y.o. -year-old male with a history of diabetes since 2012.    In terms of severity with regards to complications, the patient does not have retinopathy, does not have nephropathy, does not have neuropathy, and does not have cardiovascular disease.     With regards to the quality of blood sugar control, the most recent hemoglobin A1c test was 7.3 via POC testing today.  The patient checks blood sugars 1 times per day (meter download shows range of AM 123-139 and evening of 112-183).   Current anti-hyperglycemic regimen is comprised of metformin 1000 mg bid, glipizide 2.5 mg qd.  Other medications tried in the past include: none     In terms of associated symptoms at this time, the patient does not have polyuria, does not have blurred vision, does not have paresthesias, FOR NEW PATIENTS ONLY: does not have chest pain, does not have shortness of breath, and does not have depression.  With regards to gastroparesis symptoms, the patient does not have nausea and does not have diarrhea    Per the patient's history, last fundoscopic exam was 2 years ago and last podiatry exam was in 2017.  Patient had a 0 lb weight change in the past 6   months.    .   Currently the patient's diet comprises of 3 meals per day.  Prior formal diabetes education/ nutritional counseling has been performed.       Current frequency of hypoglycemia is rare.  Symptoms include unknown.  The patient does not have a medic alert bracelet/ID that indicates diabetes status.  The patient does not have an updated glucagon emergency kit.  The patient currently lives with wife.      Past Medical History:   Diagnosis Date   . Arthritis    . Calcium oxalate kidney stones 05/2010    s/p lithotripsy, and "gout stone"   . Complication of anesthesia     knee scope, "hard time keeping BP up"    . Diabetes mellitus    . History of gout    . Hyperlipidemia    . Hypertension    . Kidney calculi 05/2010    s/p lithotripsy   . Pain in Achilles tendon 2007   . Rash     scalp dryness, h/o Rosacea   . Type 2 diabetes mellitus, controlled      Past Surgical History:   Procedure Laterality Date   . ACHILLES TENDON SURGERY      right x2   . ENDOSCOPIC SINUS SURGERY (FESS) N/A 03/31/2016    Procedure: ENDOSCOPIC SINUS SURGERY (FESS), MAXILLARY ANTROSTOMY WITH MAXILLARY TISSUE REMOVAL;  Surgeon: Jaquelyn Bitter, MD;  Location: [facility];  Service: ENT;  Laterality: N/A;  Stereolactice computer assisted (navigational) cranial, extradural (image guidance for sinus surgery)   . ETHMOIDECTOMY N/A 03/31/2016    Procedure: ENDOSCOPIC ETHMOIDECTOMY, TOTAL ANTERIOR AND POSTERIOR;  Surgeon: Jaquelyn Bitter, MD;  Location: [facility];  Service: ENT;  Laterality: N/A;   . iris nevus  06/1991    removed   . KNEE ARTHROSCOPY  01/2008    left knee   . LITHOTRIPSY      x2   . SUSPENSION, HYOID N/A 03/31/2016    Procedure: SUSPENSION, HYOID MYOTOMY;  Surgeon: Jaquelyn Bitter, MD;  Location: [facility];  Service: ENT;  Laterality: N/A;   . TURBINOPLASTY/SMR N/A 03/31/2016    Procedure: TURBINATES RESECTIONS USING SUBMUCOSAL APPROACH, SEPTO/SMR W/WO CARTILAGE SCORING/CONTOURING Charline Bills;  Surgeon: Jaquelyn Bitter, MD;  Location: [facility];  Service: ENT;  Laterality: N/A;     Family History   Problem Relation Age of Onset   . Alzheimer's disease Mother    . Cancer Mother      skin   . Heart disease Father      x5 Bypasses   . Diabetes Father    . Cancer Father      prostate, skin   . Hypertension Brother    . Diabetes Brother    . Hyperlipidemia Neg Hx    . Kidney disease Neg Hx    . Stroke Neg Hx      Social History   Substance Use Topics   . Smoking status: Former Smoker     Packs/day: 1.50     Years: 20.00     Types: Cigarettes     Quit date: 03/20/1996   . Smokeless tobacco: Never Used   . Alcohol use  Yes      Comment: Infrequent         Review of Systems   Constitutional: Negative for unexpected weight change.   HENT: Negative for dental problem.    Eyes: Negative for visual disturbance.   Respiratory: Negative for shortness of breath.    Cardiovascular: Negative for chest pain.   Gastrointestinal: Negative for diarrhea and nausea.   Endocrine: Negative for polyuria.   Skin: Negative for wound.   Neurological: Negative for numbness.   Psychiatric/Behavioral: Negative for dysphoric mood.           Objective:    Physical Exam   Constitutional: He appears well-developed.   Obese   HENT:   Mouth/Throat: Oropharynx is clear and moist. Normal dentition.   Eyes: EOM are normal. Pupils are equal, round, and reactive to light.   Left eye iris irregular   Neck: No tracheal deviation present. No thyroid mass and no thyromegaly present.   Cardiovascular: Normal rate and regular rhythm.    No murmur heard.  Pulses:       Carotid pulses are 2+ on the right side, and 2+ on the left side.  No carotid bruits   Pulmonary/Chest: Effort normal and breath sounds normal.   Abdominal: There is no hepatosplenomegaly. There is no tenderness.   Neurological:   Reflex Scores:       Achilles reflexes are 0 on the right side and 0 on the left side.  Foot exam: intact monofilament (except right big toe diminished), vibratory and foot proprioception bilaterally   Skin:   On inspection and palpation, no skin breakdown, ulceration, corns or calluses.  Dry skin on big toes   Psychiatric: He has a normal mood and affect. Judgment normal.       Prior labs: 02/26/12: A1c 6.7, Tchol 116, TG 68, HDL 33, LDL 49, Cr 1.23, nl LFTs, Ca 9.1, Na 139, K 4.1; 03/28/12: 8 AM: plasma frac metanephrines 83 (<205), PRA 15.35 (0.25-5.82), Aldosterone 2 ng/dl- PRA elevated likely on context of HCTZ and ACE inhibitor; salivary cortisols 0.07, 0.13- not concerning for [place].  No concerning evidence for hyperfunctionality.  06/28/14: A1c 8.3, ualb/Cr 71, Hct 44.6,  Cr 1.0, APhos 109, Tchol 162, TG 270, HDL 32, LDL 76, TSH 2.08; 16/10/96: [facility] labs: Cr 1.1,  A1c 9.6, nl LFTs, Tchol 119, TG 219, HDL 32, LDL 43, ualb/Cr 12.6; 04/22/16: POC A1c 7.3; ADDENDUM: 07/10/16: [facility] labs: Cr 1.5, nl LFTs, Ca 8.8, Hct 37.3    Prior radiology: [facility] CT abd/pelvis 01/2008: Bilateral low density adrenal gland nodules are seen, largest on the right measuring 2 cm. Bilateral low density adrenal gland nodules, probable adenomas. Bilateral nonobstructing renal calculi. 05/2011: [facility] CT abd/pelvis adrenal adenomas unchanged.      [facility] 03/24/12: Compared to [facility] imaging from 01/2008: bilateral adrenal adenomas noted with right side largest at 2 cm (unchanged from 2009) low attenuation.      Assessment:       61 y.o. -year-old male with history of type 2 diabetes without known complications.    I reviewed and summarized the patient's previous medical records today, including previous relevant laboratory results and imaging reports.  In summary his prior labs suggest A1c in decent range; his prior imaging showed a right adrenal adenoma.          Plan:       1. Diabetes type 2 (new with workup): Will check hemoglobin A1c again in coming months.  Based on recent blood sugar pattern of decent fasting sugars and prandial/post-prandial sugars, will adjust medication/insulin as follows: continue metformin 1000 mg bid and Glipizide 2.5 mg daily for now.  Check sugars AM fasting and 2 hours after biggest meal of day with goal fasting/pre-meal range of 80-130; goal postprandial range of 80-160.  Is due for eye exam; will screen for nephropathy and check spot urine microalbumin/Cr later this year.  Consider seeing podiatry for foot care in the future.  Continue carbohydrate-controlled diet.  F/U in 4 months to reassess.    2. Hypertension (stable): goal blood pressure is <130/80.  Borderline controlled on regimen of HCTZ 25 mg, Lisinopril 20 mg,     3. Hyperlipidemia (stable):  controlled on Zocor 20 mg; goal LDL<100.  Continue low cholesterol, low saturated fat diet.    4. Overweight status/Obesity (stable): counseled patient on at least 150 min/wk of moderate-intensity exercise and consider flexibility/strength training exercises if possible.  Physical activity programs should begin slowly and build up gradually; nutritional counseling status.    5. Hypoglycemia management (stable): counseled patient on need to keep sugar source with him at all times and medic-alert bracelet/indicator specifying diagnosis of diabetes    6. Adrenal adenoma (stable): given prior workup was nonrevealing,will defer on further imaging tests given nearly 5 years of surveillance; could consider repeat hormonal evaluation in future if symptoms worsen.      Procedures    POC glucose, POC A1c

## 2016-06-01 ENCOUNTER — Encounter (INDEPENDENT_AMBULATORY_CARE_PROVIDER_SITE_OTHER): Payer: Self-pay | Admitting: Endocrinology, Diabetes and Metabolism

## 2016-07-10 ENCOUNTER — Emergency Department

## 2016-07-10 ENCOUNTER — Emergency Department
Admission: EM | Admit: 2016-07-10 | Discharge: 2016-07-10 | Disposition: A | Discharge status: Home / Self Care | Attending: Emergency Medicine | Admitting: Emergency Medicine

## 2016-07-10 DIAGNOSIS — E119 Type 2 diabetes mellitus without complications: Secondary | ICD-10-CM | POA: Insufficient documentation

## 2016-07-10 DIAGNOSIS — N132 Hydronephrosis with renal and ureteral calculous obstruction: Secondary | ICD-10-CM | POA: Insufficient documentation

## 2016-07-10 DIAGNOSIS — Z87442 Personal history of urinary calculi: Secondary | ICD-10-CM | POA: Insufficient documentation

## 2016-07-10 DIAGNOSIS — Z79899 Other long term (current) drug therapy: Secondary | ICD-10-CM | POA: Insufficient documentation

## 2016-07-10 DIAGNOSIS — Z87891 Personal history of nicotine dependence: Secondary | ICD-10-CM | POA: Insufficient documentation

## 2016-07-10 DIAGNOSIS — I10 Essential (primary) hypertension: Secondary | ICD-10-CM | POA: Insufficient documentation

## 2016-07-10 DIAGNOSIS — Z8739 Personal history of other diseases of the musculoskeletal system and connective tissue: Secondary | ICD-10-CM | POA: Insufficient documentation

## 2016-07-10 DIAGNOSIS — E785 Hyperlipidemia, unspecified: Secondary | ICD-10-CM | POA: Insufficient documentation

## 2016-07-10 DIAGNOSIS — R300 Dysuria: Secondary | ICD-10-CM | POA: Insufficient documentation

## 2016-07-10 DIAGNOSIS — N200 Calculus of kidney: Secondary | ICD-10-CM

## 2016-07-10 DIAGNOSIS — Z7984 Long term (current) use of oral hypoglycemic drugs: Secondary | ICD-10-CM | POA: Insufficient documentation

## 2016-07-10 LAB — CBC AND DIFFERENTIAL
Absolute NRBC: 0 10*3/uL
Basophils Absolute Automated: 0.07 10*3/uL (ref 0.00–0.20)
Basophils Automated: 1 %
Eosinophils Absolute Automated: 0.18 10*3/uL (ref 0.00–0.70)
Eosinophils Automated: 2.5 %
Hematocrit: 37.3 % — ABNORMAL LOW (ref 42.0–52.0)
Hgb: 12.3 g/dL — ABNORMAL LOW (ref 13.0–17.0)
Immature Granulocytes Absolute: 0.03 10*3/uL
Immature Granulocytes: 0.4 %
Lymphocytes Absolute Automated: 1.18 10*3/uL (ref 0.50–4.40)
Lymphocytes Automated: 16.2 %
MCH: 27.7 pg — ABNORMAL LOW (ref 28.0–32.0)
MCHC: 33 g/dL (ref 32.0–36.0)
MCV: 84 fL (ref 80.0–100.0)
MPV: 9.6 fL (ref 9.4–12.3)
Monocytes Absolute Automated: 0.44 10*3/uL (ref 0.00–1.20)
Monocytes: 6 %
Neutrophils Absolute: 5.4 10*3/uL (ref 1.80–8.10)
Neutrophils: 73.9 %
Nucleated RBC: 0 /100 WBC (ref 0.0–1.0)
Platelets: 196 10*3/uL (ref 140–400)
RBC: 4.44 10*6/uL — ABNORMAL LOW (ref 4.70–6.00)
RDW: 14 % (ref 12–15)
WBC: 7.3 10*3/uL (ref 3.50–10.80)

## 2016-07-10 LAB — URINALYSIS, REFLEX TO MICROSCOPIC EXAM IF INDICATED
Bilirubin, UA: NEGATIVE
Glucose, UA: NEGATIVE
Ketones UA: NEGATIVE
Leukocyte Esterase, UA: NEGATIVE
Nitrite, UA: NEGATIVE
Protein, UR: 100 — AB
Specific Gravity UA: 1.015 (ref 1.001–1.035)
Urine pH: 6 (ref 5.0–8.0)
Urobilinogen, UA: NORMAL mg/dL

## 2016-07-10 LAB — COMPREHENSIVE METABOLIC PANEL
ALT: 14 U/L (ref 0–55)
AST (SGOT): 11 U/L (ref 5–34)
Albumin/Globulin Ratio: 1.4 (ref 0.9–2.2)
Albumin: 3.7 g/dL (ref 3.5–5.0)
Alkaline Phosphatase: 81 U/L (ref 38–106)
Anion Gap: 10 (ref 5.0–15.0)
BUN: 24 mg/dL (ref 9.0–28.0)
Bilirubin, Total: 0.4 mg/dL (ref 0.2–1.2)
CO2: 24 mEq/L (ref 22–29)
Calcium: 8.8 mg/dL (ref 8.5–10.5)
Chloride: 107 mEq/L (ref 100–111)
Creatinine: 1.5 mg/dL — ABNORMAL HIGH (ref 0.7–1.3)
Globulin: 2.7 g/dL (ref 2.0–3.6)
Glucose: 182 mg/dL — ABNORMAL HIGH (ref 70–100)
Potassium: 4.4 mEq/L (ref 3.5–5.1)
Protein, Total: 6.4 g/dL (ref 6.0–8.3)
Sodium: 141 mEq/L (ref 136–145)

## 2016-07-10 LAB — GFR: EGFR: 47.8

## 2016-07-10 LAB — LIPASE: Lipase: 33 U/L (ref 8–78)

## 2016-07-10 MED ORDER — SODIUM CHLORIDE 0.9 % IV BOLUS
1000.0000 mL | Freq: Once | INTRAVENOUS | Status: AC
Start: 2016-07-10 — End: 2016-07-10
  Administered 2016-07-10: 1000 mL via INTRAVENOUS

## 2016-07-10 MED ORDER — AMOXICILLIN-POT CLAVULANATE 875-125 MG PO TABS
1.0000 | ORAL_TABLET | Freq: Two times a day (BID) | ORAL | 0 refills | Status: DC
Start: 2016-07-10 — End: 2016-07-10

## 2016-07-10 MED ORDER — PHENAZOPYRIDINE HCL 200 MG PO TABS
200.0000 mg | ORAL_TABLET | Freq: Once | ORAL | Status: AC
Start: 2016-07-10 — End: 2016-07-10
  Administered 2016-07-10: 07:00:00 200 mg via ORAL
  Filled 2016-07-10: qty 1

## 2016-07-10 MED ORDER — PHENAZOPYRIDINE HCL 200 MG PO TABS
200.0000 mg | ORAL_TABLET | Freq: Three times a day (TID) | ORAL | 0 refills | Status: DC | PRN
Start: 2016-07-10 — End: 2017-01-12

## 2016-07-10 MED ORDER — AMOXICILLIN-POT CLAVULANATE 875-125 MG PO TABS
1.0000 | ORAL_TABLET | Freq: Once | ORAL | Status: DC
Start: 2016-07-10 — End: 2016-07-10

## 2016-07-10 NOTE — ED Provider Notes (Signed)
EMERGENCY DEPARTMENT HISTORY AND PHYSICAL EXAM    Date: 07/10/2016  Patient Name: Francis Trevino  Attending Physician:  Kelly Splinter, MD  Diagnosis and Treatment Plan       Clinical Impression:   1. Kidney calculus    2. Dysuria        Treatment Plan:   ED Disposition     ED Disposition Condition Date/Time Comment    Discharge  Fri Jul 10, 2016  8:02 AM Francis Trevino discharge to home/self care.    Condition at disposition: Stable          History of Presenting Illness     Chief Complaint   Patient presents with   . Urinary Tract Infection Symptoms       History Provided By: Patient  Chief Complaint: Dysuria    Additional History: Francis Trevino is a 61 y.o. male w/ a history of hypertension, diabetes mellitus, hyperlipidemia, kidney stone, and gout presenting w/ acute dysuria starting last night w/ associated frequency, hematuria, and frothy urine. The patient describes the dysuria as "a lot of pressure" during urination. Denies testicular or penile pain, he says he just feels pressure. Denies fever, nausea or vomiting. Denies history of abdominal surgery. He says he goes to [facility] and had a creatinine result of 1.8. He says he has a scheduled appointment w/ a nephrologist in [place].    PCP: Roni Bread, MD   Nephrology: [facility], [place], [place])      Current Facility-Administered Medications:   .  amoxicillin-clavulanate (AUGMENTIN) 875-125 MG per tablet 1 tablet, 1 tablet, Oral, Once, Westley Foots, MD, Stopped at 07/10/16 9147    Current Outpatient Prescriptions:   .  amoxicillin-clavulanate (AUGMENTIN) 875-125 MG per tablet, Take 1 tablet by mouth 2 (two) times daily.for 7 days, Disp: 14 tablet, Rfl: 0  .  Blood Glucose Monitoring Suppl (ONETOUCH VERIO) w/Device Kit, To check BG, Disp: 1 kit, Rfl: 1  .  FREESTYLE LITE test strip, Use as instructed - for sugar checks 1-2 times daily, Disp: 200 each, Rfl: 2  .  glipiZIDE XL (GLUCOTROL XL) 2.5 MG 24 hr tablet, Take 1  tablet (2.5 mg total) by mouth daily., Disp: 90 tablet, Rfl: 1  .  glucose blood test strip, To use with onetouch verio meter. Check 2 times per day, Disp: 200 each, Rfl: 1  .  hydroCHLOROthiazide (HYDRODIURIL) 25 MG tablet, TAKE 1 TABLET DAILY (Patient taking differently: TAKE 1 TABLET DAILY in am), Disp: 90 tablet, Rfl: 0  .  Lancets (ONETOUCH ULTRASOFT) lancets, Check BG 2 times per day, Disp: 200 each, Rfl: 1  .  lisinopril (PRINIVIL,ZESTRIL) 20 MG tablet, TAKE 1 TABLET DAILY, Disp: 30 tablet, Rfl: 0  .  metFORMIN (GLUCOPHAGE) 1000 MG tablet, Take 1 tablet (1,000 mg total) by mouth 2 (two) times daily with meals., Disp: 180 tablet, Rfl: 1  .  phenazopyridine (PYRIDIUM) 200 MG tablet, Take 1 tablet (200 mg total) by mouth 3 (three) times daily as needed for Pain (PRESSURE TO URINATE, PAIN WITH URINATION)., Disp: 8 tablet, Rfl: 0  .  simvastatin (ZOCOR) 20 MG tablet, TAKE 1 TABLET NIGHTLY, Disp: 30 tablet, Rfl: 0    Past Medical History     Past Medical History:   Diagnosis Date   . Arthritis    . Calcium oxalate kidney stones 05/2010    s/p lithotripsy, and "gout stone"   . Complication of anesthesia     knee  scope, "hard time keeping BP up"   . Diabetes mellitus    . History of gout    . Hyperlipidemia    . Hypertension    . Kidney calculi 05/2010    s/p lithotripsy   . Pain in Achilles tendon 2007   . Rash     scalp dryness, h/o Rosacea   . Type 2 diabetes mellitus, controlled      Past Surgical History:   Procedure Laterality Date   . ACHILLES TENDON SURGERY      right x2   . ENDOSCOPIC SINUS SURGERY (FESS) N/A 03/31/2016    Procedure: ENDOSCOPIC SINUS SURGERY (FESS), MAXILLARY ANTROSTOMY WITH MAXILLARY TISSUE REMOVAL;  Surgeon: Jaquelyn Bitter, MD;  Location: [facility];  Service: ENT;  Laterality: N/A;  Stereolactice computer assisted (navigational) cranial, extradural (image guidance for sinus surgery)   . ETHMOIDECTOMY N/A 03/31/2016    Procedure: ENDOSCOPIC ETHMOIDECTOMY, TOTAL ANTERIOR AND  POSTERIOR;  Surgeon: Jaquelyn Bitter, MD;  Location: [facility];  Service: ENT;  Laterality: N/A;   . iris nevus  06/1991    removed   . KNEE ARTHROSCOPY  01/2008    left knee   . LITHOTRIPSY      x2   . SUSPENSION, HYOID N/A 03/31/2016    Procedure: SUSPENSION, HYOID MYOTOMY;  Surgeon: Jaquelyn Bitter, MD;  [facility];  Service: ENT;  Laterality: N/A;   . TURBINOPLASTY/SMR N/A 03/31/2016    Procedure: TURBINATES RESECTIONS USING SUBMUCOSAL APPROACH, SEPTO/SMR W/WO CARTILAGE SCORING/CONTOURING Charline Bills;  Surgeon: Jaquelyn Bitter, MD;  Location: [facility];  Service: ENT;  Laterality: N/A;       Family History     Family History   Problem Relation Age of Onset   . Alzheimer's disease Mother    . Cancer Mother      skin   . Heart disease Father      x5 Bypasses   . Diabetes Father    . Cancer Father      prostate, skin   . Hypertension Brother    . Diabetes Brother    . Hyperlipidemia Neg Hx    . Kidney disease Neg Hx    . Stroke Neg Hx        Social History     Social History     Social History   . Marital status: Married     Spouse name: N/A   . Number of children: N/A   . Years of education: N/A     Social History Main Topics   . Smoking status: Former Smoker     Packs/day: 1.50     Years: 20.00     Types: Cigarettes     Quit date: 03/20/1996   . Smokeless tobacco: Never Used   . Alcohol use Yes      Comment: Infrequent   . Drug use: No   . Sexual activity: Not on file     Other Topics Concern   . Not on file     Social History Narrative   . No narrative on file       Allergies     Allergies   Allergen Reactions   . Sulfa Antibiotics Hives   . Allopurinol Other (See Comments)     Abdominal pain       Review of Systems   Review of Systems   Constitutional: Negative for fever.   Gastrointestinal: Negative for nausea and vomiting.   Genitourinary: Positive  for dysuria (pressure), frequency and hematuria. Negative for penile pain and testicular pain.        (+) frothy urine    Allergic/Immunologic:        Multiple drug allergies    Psychiatric/Behavioral: Negative for suicidal ideas.    Patient asked and all other systems reviewed and are negative for acute complaints/concerns.    Physical Exam     BP 126/72   Pulse 70   Temp 97 F (36.1 C) (Oral)   Resp 14   Ht 6\' 3"  (1.905 m)   Wt 138.3 kg   SpO2 97%   BMI 38.12 kg/m   Pulse Oximetry Analysis - Normal 97% On RA    Physical Exam   Constitutional: He is oriented to person, place, and time. He appears well-developed and well-nourished.   HENT:   Head: Normocephalic and atraumatic.   mmm   Eyes: Conjunctivae are normal. No scleral icterus.   Neck: Normal range of motion. Neck supple.   Cardiovascular: Normal rate, regular rhythm and intact distal pulses.    Pulmonary/Chest: Effort normal. No respiratory distress.   Abdominal: Soft. He exhibits no distension. There is tenderness. There is no rebound and no guarding.   ttp epigastric region, ttp llq, no rebound/guarding   Musculoskeletal: Normal range of motion. He exhibits no edema.   Neurological: He is alert and oriented to person, place, and time.   Skin: Skin is warm and dry.   Psychiatric: He has a normal mood and affect. His behavior is normal. Judgment and thought content normal.   Nursing note and vitals reviewed.    Diagnostic Study Results     Labs -     Results     Procedure Component Value Units Date/Time    Comprehensive metabolic panel [[id number]]  (Abnormal) Collected:  07/10/16 0642    Specimen:  Blood Updated:  07/10/16 0711     Glucose 182 (H) mg/dL      BUN 40.9 mg/dL      Creatinine 1.5 (H) mg/dL      Sodium 811 mEq/L      Potassium 4.4 mEq/L      Chloride 107 mEq/L      CO2 24 mEq/L      Calcium 8.8 mg/dL      Protein, Total 6.4 g/dL      Albumin 3.7 g/dL      AST (SGOT) 11 U/L      ALT 14 U/L      Alkaline Phosphatase 81 U/L      Bilirubin, Total 0.4 mg/dL      Globulin 2.7 g/dL      Albumin/Globulin Ratio 1.4     Anion Gap 10.0    Lipase [[id number]]  Collected:  07/10/16 0642    Specimen:  Blood Updated:  07/10/16 0711     Lipase 33 U/L     GFR [[id number]] Collected:  07/10/16 4696     Updated:  07/10/16 0711     EGFR 47.8    UA with reflex to micro (pts  3 + yrs) [[id number]]  (Abnormal) Collected:  07/10/16 0642    Specimen:  Urine Updated:  07/10/16 0705     Urine Type Clean Catch     Color, UA Yellow     Clarity, UA Slightly Cloudy     Specific Gravity UA 1.015     Urine pH 6.0     Leukocyte Esterase, UA Negative     Nitrite, UA Negative  Protein, UR 100 (A)     Glucose, UA Negative     Ketones UA Negative     Urobilinogen, UA Normal mg/dL      Bilirubin, UA Negative     Blood, UA Large (A)     RBC, UA TNTC (A) /hpf      WBC, UA 0-5 /hpf      Squamous Epithelial Cells, Urine 0-5 /hpf     CBC with differential [[id number]]  (Abnormal) Collected:  07/10/16 0642    Specimen:  Blood from Blood Updated:  07/10/16 0651     WBC 7.30 x10 3/uL      Hgb 12.3 (L) g/dL      Hematocrit 40.9 (L) %      Platelets 196 x10 3/uL      RBC 4.44 (L) x10 6/uL      MCV 84.0 fL      MCH 27.7 (L) pg      MCHC 33.0 g/dL      RDW 14 %      MPV 9.6 fL      Neutrophils 73.9 %      Lymphocytes Automated 16.2 %      Monocytes 6.0 %      Eosinophils Automated 2.5 %      Basophils Automated 1.0 %      Immature Granulocyte 0.4 %      Nucleated RBC 0.0 /100 WBC      Neutrophils Absolute 5.40 x10 3/uL      Abs Lymph Automated 1.18 x10 3/uL      Abs Mono Automated 0.44 x10 3/uL      Abs Eos Automated 0.18 x10 3/uL      Absolute Baso Automated 0.07 x10 3/uL      Absolute Immature Granulocyte 0.03 x10 3/uL      Absolute NRBC 0.00 x10 3/uL     Urine culture [[id number]] Collected:  07/10/16 9562    Specimen:  Urine from Urine, Clean Catch Updated:  07/10/16 1308    Narrative:       Replace urinary catheter prior to obtaining the urine culture  if it has been in place for greater than or equal to 14  days:->N/A No Foley  Indications for Urine Culture:->Suprapubic Pain/Tenderness or  Dysuria           Radiologic Studies -   Radiology Results (24 Hour)     Procedure Component Value Units Date/Time    CT Abd/Pelvis without Contrast [[id number]] Collected:  07/10/16 0746    Order Status:  Completed Updated:  07/10/16 0752    Narrative:       HISTORY: Left flank pain    TECHNIQUE: Nonenhanced, abdominal and pelvic computed tomography was  performed at 3 mm thickness.    . A combination of automatic exposure  control, adjustment of the mA a and/or KVP according to the patient's  size and or use of iterative reconstruction technique was utilized.    PRIOR: 11/01/2015.    FINDINGS:   The evaluation of the solid organs is limited due to lack of intravenous  contrast.  The liver, spleen and pancreas are unremarkable.   There is no dilatation of the intra or extrahepatic bile ducts.   The gallbladder is unremarkable.   The gastrointestinal tract is within normal limits. There is no evidence  of gastrointestinal obstruction.        There is presence of an approximately 9 mm stone involving the left UVJ  resulting in mild  to moderate ipsilateral hydronephrosis. Otherwise the  bilateral kidneys unremarkable. Bilateral adrenal gland nodules  measuring 2 cm on the right and approximately 2 cm on the left most  consistent with adenoma remains stable.  The great vessels are normal in caliber. There is no evidence of  ascites.  The pelvis show     no evidence of pelvic masses or collection.  The urinary bladder is unremarkable.       Impression:        Left UVJ stone resulting in ipsilateral hydronephrosis.    Georgana Curio, MD   07/10/2016 7:48 AM      .    [facility] Notes     Throughout the stay in the Emergency Department, questions and concerns surrounding pain control, care plans, diagnostic studies, effects of medications administered or prescribed, and future prognostic dilemmas were assessed and addressed.    ROS addendum: The patient and/or family was asked if they had any other complaints or concerns that we could  address today and nothing of significance was noted.     VITALS:  Patient Vitals for the past 12 hrs:   BP Temp Pulse Resp   07/10/16 0810 126/72 97 F (36.1 C) 70 14   07/10/16 0625 139/79 98.7 F (37.1 C) 85 18     OLD RECORDS: prior records reviewed including dm visits with endocrine and ENT    IMP & PLAN: dysuria and urgency, quite uncomfortable. Patient without fever and no blood in urine but does have history of stones. Further states recent noted elevated cr and some left sided pain recently.  Tender on the left.  No meds taken today, no vomiting, 1 loose stool yesterday otherwise stools within his baseline.  Plan on basic labs, consideration of kidney imaging if blood in urine or other abnl. I do not feel this is diverticulitis for now so hold on po contrast imaging. Trial pyridium.    ED COURSE:   6:24 AM - Pt seen and examined. Discussed differentials and explained the need for further evaluation and lab workup. Pt agrees.    7:20 AM - Updated pt of creatinine results, advised pt on the need for CT. Pt agrees.    8:00 AM - Discussed CT and lab results with pt and counseled on diagnosis, f/u plans w/ urology, oral antibiotic use and precautions, and signs and symptoms when to return to ED.  Pt is stable and ready for discharge.       8:00 AM - Rx use and side effects, results, home self care, discharge instructions, and return precautions discussed extensively with patient with wife at bedside. Possibility of evolving illness reviewed. All questions solicited and addressed. Patient is amenable to discharge.     _______________________________  Medical DeMedical Decision Makingcision Making  Attestations:     Physician/Midlevel provider first contact with patient: 07/10/16 0621         This note is prepared by Molli Hazard, acting as Scribe for Kelly Splinter, MD.    Kelly Splinter, MD:  The scribe's documentation has been prepared under my direction and personally reviewed by me in its entirety.  I  confirm that the note above accurately reflects all work, treatment, procedures, and medical decision making performed by me.    I am the first provider for this patient.    Kelly Splinter, MD is the primary emergency doctor of record.    I reviewed the vital signs, available nursing notes, past medical history, past surgical history, family  history and social history.    _______________________________           Westley Foots, MD  07/10/16 [phone]

## 2016-07-10 NOTE — ED Notes (Signed)
IVF continue to infuse on controller pump.

## 2016-07-10 NOTE — Discharge Instructions (Signed)
Kidney Stones    You have been seen for a kidney stone.    A kidney stone is a hard mineral and crystalline material (a lot like gravel). It forms inside the kidney or the urinary tract. When it moves from the kidney into the tube between the kidney and the bladder (the ureter), it causes severe pain. Kidney stones form when there is less urine (pee) or too many stone-forming substances in the urine. Normally, kidney stones are made of calcium oxalate or calcium phosphate. Kidney stones associated with infection in the urinary tract are called struvite or infection stones.    Symptoms include sharp pain in the side that may radiate (spread) to the groin. Nausea and vomiting are also common. A doctor diagnosed your kidney stone based on your exam, urine test, and medical history. The doctor may have run a special test called a helical CT stone study or an intravenous pyelogram (IVP).    Men get kidney stones more often than women. Whites get them more often than African-Americans. Kidney stones become more common when men reach their 1s. The risk gets higher with age. People who have already had more than one kidney stone often get more stones.    There are different conditions that can cause kidney stones: Hypercalcuria is an inherited (genetic) condition that causes high calcium in the urine. This causes stones in more than half of cases. There are other conditions that cause an increased risk of kidney stones. These include gout (a joint condition), hyperparathyroidism (a hormone condition), inflammatory bowel disease (Crohn's disease and Ulcerative colitis) and intestinal bypass surgery. They also include kidney diseases like renal tubular acidosis. Certain medicines also increase the risk of kidney stones. These include some diuretics (water pills), antacids with calcium, and the HIV drug indinavir (Crixivan).    Most kidney stones pass on their own. Larger stones or stones that don't pass in a few days  may need to be taken out. This is done by a urologist (a doctor who specializes in the urinary tract). Kidney stones are normally treated with pain and nausea medicine.    You should drink lots of fluid--up to 8 glasses of water a day. You should follow up with a urologist in the next 2-3 weeks. Strain all of your urine so you can catch the kidney stone as it passes out of the bladder. Keep the stone and bring it with you to your urology appointment. The urologist may have the stone tested to find out what it is made of. This may help the urologist recommend diet changes. He or she may also suggest medicines to help prevent more kidney stones.    YOU SHOULD SEEK MEDICAL ATTENTION IMMEDIATELY, EITHER HERE OR AT THE NEAREST EMERGENCY DEPARTMENT, IF ANY OF THE FOLLOWING OCCURS:   The pain gets worse or the medicine isn't enough to treat your pain.   You get sick (nausea) or vomit and can't keep down fluids or pain medicine.   You have a fever (temperature higher than 100.72F / 38C) or shaking chills.    YOUR KIDNEY FUNCTION IS BETTER TODAY. DRINK PLENTY OF FLUIDS.  WE ARE TREATING YOU FOR A POSSIBLE INFECTION WITH ANTIBIOTICS TODAY AS THE CULTURE WILL TAKE 2 DAYS TO RESULT.  RETURN FOR FEVERS, VOMITING, DIZZINESS, OR ANY OTHER CONCERNS.    PLEASE CALL UROLOGY TODAY. YOUR STONE IS LARGE AND YOU MAY HAVE DIFFICULTY PASSING IT AND MAY REQUIRE A UROLOGICAL PROCEDURE.  YOU CAN FOLLOW UP WITH NEPHROLOGY  ALSO BUT YOU WILL NEED A NEW KIDNEY FUNCTION TEST AFTER YOU PASS THE STONE.

## 2016-07-10 NOTE — ED Triage Notes (Signed)
Patient reports leakage last night and painful and frequent urination.  0200 pain and pressure with urination and this morning same symptoms.  Patient has history of kidney stones.  No pain meds taken.

## 2016-07-11 ENCOUNTER — Ambulatory Visit
Admission: RE | Admit: 2016-07-11 | Discharge: 2016-07-11 | Disposition: A | Discharge status: Home / Self Care | Source: Ambulatory Visit | Attending: Urology | Admitting: Urology

## 2016-07-11 ENCOUNTER — Encounter
Admission: RE | Disposition: A | Discharge status: Home / Self Care | Payer: Self-pay | Source: Ambulatory Visit | Attending: Urology

## 2016-07-11 ENCOUNTER — Ambulatory Visit: Admitting: Urology

## 2016-07-11 ENCOUNTER — Ambulatory Visit

## 2016-07-11 ENCOUNTER — Ambulatory Visit: Admitting: Certified Registered"

## 2016-07-11 DIAGNOSIS — I10 Essential (primary) hypertension: Secondary | ICD-10-CM | POA: Insufficient documentation

## 2016-07-11 DIAGNOSIS — M109 Gout, unspecified: Secondary | ICD-10-CM | POA: Insufficient documentation

## 2016-07-11 DIAGNOSIS — G4733 Obstructive sleep apnea (adult) (pediatric): Secondary | ICD-10-CM | POA: Insufficient documentation

## 2016-07-11 DIAGNOSIS — N400 Enlarged prostate without lower urinary tract symptoms: Secondary | ICD-10-CM | POA: Insufficient documentation

## 2016-07-11 DIAGNOSIS — E119 Type 2 diabetes mellitus without complications: Secondary | ICD-10-CM | POA: Insufficient documentation

## 2016-07-11 DIAGNOSIS — Z7984 Long term (current) use of oral hypoglycemic drugs: Secondary | ICD-10-CM | POA: Insufficient documentation

## 2016-07-11 DIAGNOSIS — E785 Hyperlipidemia, unspecified: Secondary | ICD-10-CM | POA: Insufficient documentation

## 2016-07-11 DIAGNOSIS — N132 Hydronephrosis with renal and ureteral calculous obstruction: Secondary | ICD-10-CM | POA: Insufficient documentation

## 2016-07-11 HISTORY — PX: CYSTOSCOPY,  RETROGRADE, INSERTION URETERAL STENT: SHX3606

## 2016-07-11 HISTORY — PX: CYSTOSCOPY, URETEROSCOPY, LASER LITHOTRIPSY: SHX3633

## 2016-07-11 LAB — GLUCOSE WHOLE BLOOD - POCT
Whole Blood Glucose POCT: 176 mg/dL — ABNORMAL HIGH (ref 70–100)
Whole Blood Glucose POCT: 183 mg/dL — ABNORMAL HIGH (ref 70–100)

## 2016-07-11 SURGERY — CYSTOSCOPY, URETEROSCOPY, LASER LITHOTRIPSY
Anesthesia: Anesthesia General | Site: Pelvis | Laterality: Left | Wound class: Clean Contaminated

## 2016-07-11 MED ORDER — PROPOFOL 10 MG/ML IV EMUL (WRAP)
INTRAVENOUS | Status: AC
Start: 2016-07-11 — End: ?
  Filled 2016-07-11: qty 20

## 2016-07-11 MED ORDER — CLOTRIMAZOLE-BETAMETHASONE 1-0.05 % EX CREA
TOPICAL_CREAM | Freq: Two times a day (BID) | CUTANEOUS | 0 refills | Status: DC
Start: 2016-07-11 — End: 2017-06-11

## 2016-07-11 MED ORDER — IOTHALAMATE MEGLUMINE 17.2 % UR SOLN
URETHRAL | Status: DC | PRN
Start: 2016-07-11 — End: 2016-07-11
  Administered 2016-07-11: 4.5 mL via INTRAVESICAL

## 2016-07-11 MED ORDER — LIDOCAINE HCL 2 % IJ SOLN
INTRAMUSCULAR | Status: DC | PRN
Start: 2016-07-11 — End: 2016-07-11
  Administered 2016-07-11: 40 mg

## 2016-07-11 MED ORDER — LIDOCAINE(URO-JET) 2% JELLY (WRAP)
CUTANEOUS | Status: DC | PRN
Start: 2016-07-11 — End: 2016-07-11
  Administered 2016-07-11: 1 via TOPICAL

## 2016-07-11 MED ORDER — CEFAZOLIN SODIUM 1 G IJ SOLR
2.0000 g | Freq: Once | INTRAMUSCULAR | Status: DC
Start: 2016-07-11 — End: 2016-07-11

## 2016-07-11 MED ORDER — MIDAZOLAM HCL 2 MG/2ML IJ SOLN
INTRAMUSCULAR | Status: AC
Start: 2016-07-11 — End: ?
  Filled 2016-07-11: qty 2

## 2016-07-11 MED ORDER — ROCURONIUM BROMIDE 10 MG/ML IV SOLN (WRAP)
INTRAVENOUS | Status: DC | PRN
Start: 2016-07-11 — End: 2016-07-11
  Administered 2016-07-11: 5 mg via INTRAVENOUS

## 2016-07-11 MED ORDER — CEFAZOLIN SODIUM 1 G IJ SOLR
2.0000 g | Freq: Once | INTRAMUSCULAR | Status: AC
Start: 2016-07-11 — End: 2016-07-11
  Administered 2016-07-11: 08:00:00 2 g via INTRAVENOUS

## 2016-07-11 MED ORDER — HYDROMORPHONE HCL 1 MG/ML IJ SOLN
0.5000 mg | INTRAMUSCULAR | Status: DC | PRN
Start: 2016-07-11 — End: 2016-07-11

## 2016-07-11 MED ORDER — CEFAZOLIN SODIUM 1 G IJ SOLR
INTRAMUSCULAR | Status: AC
Start: 2016-07-11 — End: 2016-07-11
  Filled 2016-07-11: qty 2000

## 2016-07-11 MED ORDER — DIPHENHYDRAMINE HCL 50 MG/ML IJ SOLN
12.5000 mg | Freq: Four times a day (QID) | INTRAMUSCULAR | Status: DC | PRN
Start: 2016-07-11 — End: 2016-07-11

## 2016-07-11 MED ORDER — ONDANSETRON HCL 4 MG/2ML IJ SOLN
INTRAMUSCULAR | Status: DC | PRN
Start: 2016-07-11 — End: 2016-07-11
  Administered 2016-07-11: 4 mg via INTRAVENOUS

## 2016-07-11 MED ORDER — MIDAZOLAM HCL 2 MG/2ML IJ SOLN
INTRAMUSCULAR | Status: DC | PRN
Start: 2016-07-11 — End: 2016-07-11
  Administered 2016-07-11: 2 mg via INTRAVENOUS

## 2016-07-11 MED ORDER — ONDANSETRON HCL 4 MG/2ML IJ SOLN
4.0000 mg | Freq: Once | INTRAMUSCULAR | Status: DC | PRN
Start: 2016-07-11 — End: 2016-07-11

## 2016-07-11 MED ORDER — SUCCINYLCHOLINE CHLORIDE 20 MG/ML IJ SOLN
INTRAMUSCULAR | Status: DC | PRN
Start: 2016-07-11 — End: 2016-07-11
  Administered 2016-07-11: 200 mg via INTRAVENOUS

## 2016-07-11 MED ORDER — LIDOCAINE HCL (PF) 2 % IJ SOLN
INTRAMUSCULAR | Status: AC
Start: 2016-07-11 — End: ?
  Filled 2016-07-11: qty 5

## 2016-07-11 MED ORDER — PROMETHAZINE HCL 25 MG/ML IJ SOLN
6.2500 mg | Freq: Once | INTRAMUSCULAR | Status: DC | PRN
Start: 2016-07-11 — End: 2016-07-11

## 2016-07-11 MED ORDER — PHENYLEPHRINE HCL 10 MG/ML IV SOLN (WRAP)
Status: DC | PRN
Start: 2016-07-11 — End: 2016-07-11
  Administered 2016-07-11 (×2): 100 ug via INTRAVENOUS

## 2016-07-11 MED ORDER — EPHEDRINE SULFATE 50 MG/ML IJ/IV SOLN (WRAP)
Status: DC | PRN
Start: 2016-07-11 — End: 2016-07-11
  Administered 2016-07-11: 10 mg via INTRAVENOUS

## 2016-07-11 MED ORDER — SUCCINYLCHOLINE CHLORIDE 20 MG/ML IJ SOLN
INTRAMUSCULAR | Status: AC
Start: 2016-07-11 — End: ?
  Filled 2016-07-11: qty 10

## 2016-07-11 MED ORDER — LACTATED RINGERS IV SOLN
INTRAVENOUS | Status: DC | PRN
Start: 2016-07-11 — End: 2016-07-11

## 2016-07-11 MED ORDER — FENTANYL CITRATE (PF) 50 MCG/ML IJ SOLN (WRAP)
INTRAMUSCULAR | Status: DC | PRN
Start: 2016-07-11 — End: 2016-07-11
  Administered 2016-07-11: 100 ug via INTRAVENOUS

## 2016-07-11 MED ORDER — FENTANYL CITRATE (PF) 50 MCG/ML IJ SOLN (WRAP)
INTRAMUSCULAR | Status: AC
Start: 2016-07-11 — End: ?
  Filled 2016-07-11: qty 2

## 2016-07-11 MED ORDER — FENTANYL CITRATE (PF) 50 MCG/ML IJ SOLN (WRAP)
25.0000 ug | INTRAMUSCULAR | Status: DC | PRN
Start: 2016-07-11 — End: 2016-07-11

## 2016-07-11 MED ORDER — OXYCODONE-ACETAMINOPHEN 5-325 MG PO TABS
1.0000 | ORAL_TABLET | Freq: Once | ORAL | Status: DC | PRN
Start: 2016-07-11 — End: 2016-07-11

## 2016-07-11 MED ORDER — VASOPRESSIN 20 UNIT/ML IV SOLN
0.0400 [IU]/min | INTRAVENOUS | Status: DC
Start: 2016-07-11 — End: 2016-07-11
  Filled 2016-07-11: qty 3

## 2016-07-11 SURGICAL SUPPLY — 30 items
BASKET STON RTRVL NTNL 0TP 12MM 1.9FR (Endoscopic Supplies)
BASKET STONE RETRIEVAL L120 CM OD12 MM (Endoscopic Supplies) IMPLANT
BASKET STONE RETRIEVAL L120 CM OD12 MM ODSEC1.9 FR ZERO TIP URETERAL 4 (Endoscopic Supplies) IMPLANT
CATHETER URET FLXM 5FR 70CM LF STRL 1 (Catheter Urine)
CATHETER URETERAL FLEXIMA OD5 FR L70 CM (Catheter Urine) IMPLANT
CATHETER URETERAL FLEXIMA OD5 FR L70 CM 1 LUMEN INJECTION HUB (Catheter Urine) IMPLANT
CATHETER URETHRAL OD22 FR 5 CC FOLEY 2 (Catheter Miscellaneous) IMPLANT
CATHETER URETHRAL OD22 FR FOLEY 2 WAY 1 EYE MEDIUM OLIVE COUDE TIP 5 (Catheter Miscellaneous) IMPLANT
CATHETER URTH BACTI-GRD RBR DDRGL 5CC (Catheter Micellaneous)
FIBER LSR STIMLINE 200XL (Laser Supplies) IMPLANT
GLOVE SYNT DURAPRENE SZ 7.5 (Glove) ×2 IMPLANT
GW SENSOR .035INX150CM (Guidewire) ×2 IMPLANT
HANDPIECE SLIMLINE 365 LSR F (Laser Supplies) IMPLANT
HANDPIECE SLIMLINE 550 LSR FBR (Laser Supplies) IMPLANT
HOLDER CATH (Procedure Accessories) ×2 IMPLANT
SOL NACL 0.9% IRR 3000CC (Irrigation Solutions) ×1
SOLUTION IRR 0.9% NACL 3L URMTC LF PLS (Irrigation Solutions) ×1 IMPLANT
SOLUTION IRRIGATION 0.9% SODIUM CHLORIDE 3000 ML PLASTIC CONTAINER (Irrigation Solutions) ×1 IMPLANT
STENT POLARIS ULTRA 6F 24CM (Stent) ×2 IMPLANT
SYSTEM IRR 10CC SAPS LF 1 ACT 1 WY VLV (Procedure Accessories) ×2
SYSTEM IRRIGATION 1 ACTION 1 WAY VALVE (Procedure Accessories) ×1 IMPLANT
SYSTEM IRRIGATION 1 ACTION 1 WAY VALVE VACUUM SYRINGE SAPS 10 CC (Procedure Accessories) ×1 IMPLANT
TOWEL L26 IN X W17 IN COTTON PREWASH (Procedure Accessories) ×1 IMPLANT
TOWEL L26 IN X W17 IN COTTON PREWASH DELINT BLUE ACTISORB SURGICAL (Procedure Accessories) ×1 IMPLANT
TOWEL SRG CTTN 26X17IN LF STRL PREWASH (Procedure Accessories) ×2
TRAY CYSTOSCOPY PACK (Pack) ×2 IMPLANT
WATER STERILE PLASTIC POUR BOTTLE 1000 (Irrigation Solutions) ×1 IMPLANT
WATER STERILE PLASTIC POUR BOTTLE 1000 ML (Irrigation Solutions) ×1 IMPLANT
WATER STRL 1000ML LF PLS PR BTL (Irrigation Solutions) ×1
WATER STRL 1000ML PLS PR BTL LF (Irrigation Solutions) ×1

## 2016-07-11 NOTE — H&P (Signed)
ADMISSION HISTORY AND PHYSICAL EXAM    Date Time: 07/11/16 7:41 AM  Patient Name: Francis Trevino  Attending Physician: Prescilla Sours, MD      Chief Complaint:   Left ureteral stone    History of Present Illness:   Francis Trevino is a 61 y.o. male who presents to the hospital with left ureteral stone  Patient Active Problem List   Diagnosis   . Recurrent nephrolithiasis   . Benign essential hypertension   . Multiple joint pain   . History of gout   . Adrenal adenoma   . Hyperlipidemia   . Other dyspnea and respiratory abnormality   . Mild obstructive sleep apnea   . Hyperuricemia   . Heart murmur   . Diastolic dysfunction   . Aortic root enlargement   . Renal lesion   . Splenomegaly   . Type II diabetes mellitus, uncontrolled   . Surgery, elective       Past Medical History:     Past Medical History:   Diagnosis Date   . Abnormal vision     wear glasses   . Arthritis    . Calcium oxalate kidney stones 05/2010    s/p lithotripsy, and "gout stone"   . Complication of anesthesia     knee scope, "hard time keeping BP up"   . Diabetes mellitus    . History of gout    . Hyperlipidemia    . Hypertension    . Kidney calculi 05/2010    s/p lithotripsy   . Pain in Achilles tendon 2007   . Rash     scalp dryness, h/o Rosacea   . Rosacea    . Sleep apnea     uses CPAP   . Type 2 diabetes mellitus, controlled        Past Surgical History:     Past Surgical History:   Procedure Laterality Date   . ACHILLES TENDON SURGERY Right 2005,2006    right x2   . ENDOSCOPIC SINUS SURGERY (FESS) N/A 03/31/2016    Procedure: ENDOSCOPIC SINUS SURGERY (FESS), MAXILLARY ANTROSTOMY WITH MAXILLARY TISSUE REMOVAL;  Surgeon: Jaquelyn Bitter, MD;  Location: [facility];  Service: ENT;  Laterality: N/A;  Stereolactice computer assisted (navigational) cranial, extradural (image guidance for sinus surgery)   . ETHMOIDECTOMY N/A 03/31/2016    Procedure: ENDOSCOPIC ETHMOIDECTOMY, TOTAL ANTERIOR AND POSTERIOR;  Surgeon: Jaquelyn Bitter, MD;   Location: [facility];  Service: ENT;  Laterality: N/A;   . iris nevus  06/1991    removed   . KNEE ARTHROSCOPY  01/2008    left knee   . LITHOTRIPSY      x2   . SUSPENSION, HYOID N/A 03/31/2016    Procedure: SUSPENSION, HYOID MYOTOMY;  Surgeon: Jaquelyn Bitter, MD;  Location: [facility];  Service: ENT;  Laterality: N/A;   . TURBINOPLASTY/SMR N/A 03/31/2016    Procedure: TURBINATES RESECTIONS USING SUBMUCOSAL APPROACH, SEPTO/SMR W/WO CARTILAGE SCORING/CONTOURING Charline Bills;  Surgeon: Jaquelyn Bitter, MD;  Location: [facility];  Service: ENT;  Laterality: N/A;       Family History:     Family History   Problem Relation Age of Onset   . Alzheimer's disease Mother    . Cancer Mother      skin   . Heart disease Father      x5 Bypasses   . Diabetes Father    . Cancer Father      prostate, skin   .  Hypertension Brother    . Diabetes Brother    . Hyperlipidemia Neg Hx    . Kidney disease Neg Hx    . Stroke Neg Hx    . Colon cancer Neg Hx    . Malignant hyperthermia Neg Hx    . Anesthesia problems Neg Hx        Social History:     Social History     Social History   . Marital status: Married     Spouse name: N/A   . Number of children: N/A   . Years of education: N/A     Social History Main Topics   . Smoking status: Former Smoker     Packs/day: 1.50     Years: 20.00     Types: Cigarettes     Quit date: 03/20/1996   . Smokeless tobacco: Never Used   . Alcohol use Yes      Comment: Infrequent   . Drug use: No   . Sexual activity: Not on file     Other Topics Concern   . Not on file     Social History Narrative   . No narrative on file       Allergies:     Allergies   Allergen Reactions   . Sulfa Antibiotics Hives   . Allopurinol Other (See Comments)     Abdominal pain       Medications:     Prescriptions Prior to Admission   Medication Sig   . glipiZIDE XL (GLUCOTROL XL) 2.5 MG 24 hr tablet Take 1 tablet (2.5 mg total) by mouth daily. (Patient taking differently: Take 2.5 mg by mouth every evening.    )   .  hydroCHLOROthiazide (HYDRODIURIL) 25 MG tablet TAKE 1 TABLET DAILY (Patient taking differently: TAKE 1 TABLET DAILY in am)   . lisinopril (PRINIVIL,ZESTRIL) 20 MG tablet TAKE 1 TABLET DAILY   . metFORMIN (GLUCOPHAGE) 1000 MG tablet Take 1 tablet (1,000 mg total) by mouth 2 (two) times daily with meals.   Marland Kitchen oxyCODONE-acetaminophen (PERCOCET) 5-325 MG per tablet Take 1 tablet by mouth every 6 (six) hours as needed for Pain.   . phenazopyridine (PYRIDIUM) 200 MG tablet Take 1 tablet (200 mg total) by mouth 3 (three) times daily as needed for Pain (PRESSURE TO URINATE, PAIN WITH URINATION).   Marland Kitchen simvastatin (ZOCOR) 20 MG tablet TAKE 1 TABLET NIGHTLY   . glucose blood test strip To use with onetouch verio meter. Check 2 times per day   . Lancets (ONETOUCH ULTRASOFT) lancets Check BG 2 times per day     .  Prescriptions Prior to Admission   Medication Sig Dispense Refill Last Dose   . glipiZIDE XL (GLUCOTROL XL) 2.5 MG 24 hr tablet Take 1 tablet (2.5 mg total) by mouth daily. (Patient taking differently: Take 2.5 mg by mouth every evening.    ) 90 tablet 1 07/10/2016 at 1000   . hydroCHLOROthiazide (HYDRODIURIL) 25 MG tablet TAKE 1 TABLET DAILY (Patient taking differently: TAKE 1 TABLET DAILY in am) 90 tablet 0 07/10/2016 at 1000   . lisinopril (PRINIVIL,ZESTRIL) 20 MG tablet TAKE 1 TABLET DAILY 30 tablet 0 07/10/2016 at Unknown time   . metFORMIN (GLUCOPHAGE) 1000 MG tablet Take 1 tablet (1,000 mg total) by mouth 2 (two) times daily with meals. 180 tablet 1 07/10/2016 at 2100   . oxyCODONE-acetaminophen (PERCOCET) 5-325 MG per tablet Take 1 tablet by mouth every 6 (six) hours as needed for Pain.  07/11/2016 at 0500   . phenazopyridine (PYRIDIUM) 200 MG tablet Take 1 tablet (200 mg total) by mouth 3 (three) times daily as needed for Pain (PRESSURE TO URINATE, PAIN WITH URINATION). 8 tablet 0 07/11/2016 at 0500   . simvastatin (ZOCOR) 20 MG tablet TAKE 1 TABLET NIGHTLY 30 tablet 0 07/10/2016 at 1000   . glucose blood test strip To use  with onetouch verio meter. Check 2 times per day 200 each 1 Taking   . Lancets (ONETOUCH ULTRASOFT) lancets Check BG 2 times per day 200 each 1 Taking       Review of Systems:     Constitutional: negative for chills, fatigue, fevers and weight loss  Eyes: negative for redness and visual disturbance  Ears, nose, mouth, throat, and face: negative for epistaxis, nasal congestion and sore throat  Respiratory: negative for cough, dyspnea on exertion and sputum  Cardiovascular: negative for chest pain and palpitations  Gastrointestinal: negative for change in bowel habits, nausea and vomiting  Genitourinary:negative for dysuria and hematuria  Skin: negative for rash and skin lesion(s)  Hematologic/lymphatic: negative for bleeding and easy bruising  Musculoskeletal:negative for back pain and bone pain  Endocrine: negative for diabetic symptoms including polyuria and pruritus      Physical Exam:     Vitals:    07/11/16 0700   BP: 143/76   Pulse: 73   Resp: 16   Temp: (!) 96.4 F (35.8 C)   SpO2: 98%       Intake and Output Summary (Last 24 hours) at Date Time  No intake or output data in the 24 hours ending 07/11/16 0741    General appearance - alert, well appearing, and in no distressChest - clear in auscultation,anterior /posterior. No rales or rhonchi  Heart - normal rate, regular rhythm, normal S1, S2, no murmurs appreciated  Abdomen - soft, nontender, nondistended, no masses or organomegaly  GU Male -normal phallis , bilateral descended testicles. No masses or tenderness;   Musculoskeletal - no joint tenderness, deformity or swelling  Extremities - peripheral pulses normal, no pedal edema,no venous stasis  Skin - normal coloration and turgor, worm  And no rashes  Labs:     Results     Procedure Component Value Units Date/Time    Glucose Whole Blood - POCT [[id number]]  (Abnormal) Collected:  07/11/16 0659     Updated:  07/11/16 1324     POCT - Glucose Whole blood 176 (H) mg/dL           Imaging/Radiology:   Ct  Abd/pelvis Without Contrast    Result Date: 07/10/2016  HISTORY: Left flank pain TECHNIQUE: Nonenhanced, abdominal and pelvic computed tomography was performed at 3 mm thickness.    . A combination of automatic exposure control, adjustment of the mA a and/or KVP according to the patient's size and or use of iterative reconstruction technique was utilized. PRIOR: 11/01/2015. FINDINGS: The evaluation of the solid organs is limited due to lack of intravenous contrast. The liver, spleen and pancreas are unremarkable. There is no dilatation of the intra or extrahepatic bile ducts. The gallbladder is unremarkable. The gastrointestinal tract is within normal limits. There is no evidence of gastrointestinal obstruction.      There is presence of an approximately 9 mm stone involving the left UVJ resulting in mild to moderate ipsilateral hydronephrosis. Otherwise the bilateral kidneys unremarkable. Bilateral adrenal gland nodules measuring 2 cm on the right and approximately 2 cm on the left most consistent with adenoma  remains stable. The great vessels are normal in caliber. There is no evidence of ascites. The pelvis show     no evidence of pelvic masses or collection. The urinary bladder is unremarkable.      Left UVJ stone resulting in ipsilateral hydronephrosis. Georgana Curio, MD 07/10/2016 7:48 AM        Assessment:   Left ureteral  stone      Plan:   Left ureteroscopy , laser lithottripsy      Signed by: Prescilla Sours, MD

## 2016-07-11 NOTE — Transfer of Care (Signed)
Anesthesia Transfer of Care Note    Patient: Francis Trevino    Procedures performed: Procedure(s):  CYSTOSCOPY, URETEROSCOPY, RETROGRADE WITH STENT, LEFT    Anesthesia type: General ETT    Patient location:PACU    Last vitals:   Vitals:    07/11/16 0849   BP: 129/73   Pulse: 79   Resp: 16   Temp: 36.7 C (98 F)   SpO2: 99%       Post pain: Patient not complaining of pain, continue current therapy      Mental Status:awake and alert     Respiratory Function: tolerating face mask    Cardiovascular: stable    Nausea/Vomiting: patient not complaining of nausea or vomiting    Hydration Status: adequate    Post assessment: no apparent anesthetic complications    Signed by: Norvel Richards  07/11/16 8:50 AM

## 2016-07-11 NOTE — Brief Op Note (Signed)
BRIEF OP NOTE    Date Time: 07/11/16 8:39 AM    Patient Name:   Francis Trevino    Date of Operation:   07/11/2016    Providers Performing:   Surgeon(s):  Prescilla Sours, MD    Assistant (s):    Operative Procedure:   Procedure(s):  CYSTOSCOPY, URETEROSCOPY, RETROGRADE WITH STENT, LEFT    Preoperative Diagnosis:   Pre-Op Diagnosis Codes:     * Left ureteral stone [N20.1]   Left hydronephrosis    Postoperative Diagnosis:   ureteral edema    Anesthesia:   General    Estimated Blood Loss:   Minimal    Drains:       Specimens:       Findings:   ureteral edema  And obstruction    Complications:   None      Signed by: Prescilla Sours, MD                                                                           [facility]

## 2016-07-11 NOTE — Anesthesia Postprocedure Evaluation (Signed)
Anesthesia Post Evaluation    Patient: Francis Trevino    Procedures performed: Procedure(s):  CYSTOSCOPY, URETEROSCOPY, RETROGRADE WITH STENT, LEFT    Anesthesia type: General ETT    Patient location:Phase I PACU    Last vitals:   Vitals:    07/11/16 0900   BP: 123/71   Pulse: 81   Resp: 14   Temp:    SpO2: 95%       Post pain: Patient not complaining of pain, continue current therapy      Mental Status:awake    Respiratory Function: tolerating room air    Cardiovascular: stable    Nausea/Vomiting: patient not complaining of nausea or vomiting    Hydration Status: adequate    Post assessment: no apparent anesthetic complications    Signed by: Merilyn Baba, 07/11/2016 9:23 AM

## 2016-07-11 NOTE — Anesthesia Preprocedure Evaluation (Signed)
Anesthesia Evaluation    AIRWAY    Mallampati: III    TM distance: >3 FB  Neck ROM: full  Mouth Opening:full   CARDIOVASCULAR    cardiovascular exam normal, regular and normal       DENTAL    no notable dental hx       PULMONARY    pulmonary exam normal and clear to auscultation     OTHER FINDINGS            Relevant Problems   No relevant active problems               Anesthesia Plan    ASA 3     general               Francis Trevino is a 61 y.o. male     Past Medical History:  Diagnosis Date  . Abnormal vision    wear glasses  . Arthritis   . Calcium oxalate kidney stones 05/2010   s/p lithotripsy, and "gout stone"  . Complication of anesthesia    knee scope, "hard time keeping BP up"  . Diabetes mellitus   . History of gout   . Hyperlipidemia   . Hypertension   . Kidney calculi 05/2010   s/p lithotripsy  . Pain in Achilles tendon 2007  . Rash    scalp dryness, h/o Rosacea  . Rosacea   . Sleep apnea    uses CPAP  . Type 2 diabetes mellitus, controlled     Hx hypotension under anesthesia    The patient denies history of significant cardiopulmonary issues or prior problems with anesthesia  (other than addressed above) and is appropriately NPO. Physical exam as above.    Risks and benefits discussed with the patient. The patient states understanding and consents to the anesthetic plan.      Plan for GA with ETT, patient with N/V this AM so plan for RSI)      intravenous induction   Detailed anesthesia plan: general endotracheal        Post op pain management: per surgeon and PO analgesics    informed consent obtained    Plan discussed with CRNA.      pertinent labs reviewed             Signed by: Merilyn Baba 07/11/16 7:03 AM        '  Chemistry        Component Value Date/Time    NA 141 07/10/2016 0642    K 4.4 07/10/2016 0642    CL 107 07/10/2016 0642    CL 107 08/01/2013 0835    CO2 24 07/10/2016 0642    BUN 24.0 07/10/2016 0642    CREAT 1.5 (H) 07/10/2016 0642    CREAT 1.03 08/01/2013 0835    GLU 182 (H)  07/10/2016 0642        Component Value Date/Time    CA 8.8 07/10/2016 0642    ALKPHOS 81 07/10/2016 0642    AST 11 07/10/2016 0642    ALT 14 07/10/2016 0642    BILITOTAL 0.4 07/10/2016 0642        Lab Results   Component Value Date    WBC 7.30 07/10/2016    HGB 12.3 (L) 07/10/2016    HCT 37.3 (L) 07/10/2016    MCV 84.0 07/10/2016    PLT 196 07/10/2016

## 2016-07-11 NOTE — Progress Notes (Signed)
Patient has sleep apnea and uses CPAP at home, he brought it today, anesthesia aware of patient diagnosis and [employer] notified.

## 2016-07-11 NOTE — Op Note (Signed)
Procedure Date: 07/11/2016     Patient Type: A     SURGEON: Prescilla Sours MD  ASSISTANT:       PREOPERATIVE DIAGNOSIS:  1.  Left ureteral stone.  2.  Left hydronephrosis.  3.  Left renal colic.     POSTOPERATIVE DIAGNOSES:  Left ureteral edema.     TITLE OF PROCEDURES:  1.  Cystoscopy.  2.  Left ureteroscopy.  3.  Retrograde pyelogram.  4.  Placement of double-J stent.     ANESTHESIA:  General.     ESTIMATED BLOOD LOSS:  None.     COMPLICATIONS:  None.     INDICATIONS:  A 61 year old male with a few days' history of left flank pain with  irritative urinary symptoms.  The patient was seen by primary care  physician and placed on oral antibiotics.  However, his pain persisted.  CT  scan revealed 9-mm obstructing distal left ureteral stone.  Therefore,  patient is scheduled today for cystoscopy, ureteroscopy, laser lithotripsy.   All risks and complications were discussed with the patient including  infection, bleeding, possible stent placement.  The patient agreed.     DESCRIPTION OF PROCEDURE:  The patient was taken to the cystoscopy suite room and after achievement of  effective general anesthesia, lower abdomen, genitalia, and perineum were  prepped and draped in usual sterile fashion.  Utilizing a 22-French  cystoscope with a 30-degree lens in place, urethra was traversed without  difficulty and significant for mild prostatic enlargement.  Bladder was  entered, and patient was noticed to have some blood clots and some debris  in the bladder with significant edema of left ureteral meatus.  A guidewire  was introduced and advanced up to the renal pelvis under fluoroscopic  guidance.  Then, a left ureteroscopy was performed where reveals  significant edema and erythema involving the distal ureter; however, there  was no evidence of stone.  The scope then was advanced all the way up to  the renal pelvis.  Retrograde pyelogram revealed no evidence of filling  defect, and there is a prompt drainage.  Then, over the  guidewire, a  double-J stent, size 24 cm, 6-French was placed without difficulty.   Bladder was emptied, cystoscope was removed, and the dangler, which  attached to the end of the stent was secured to the dorsal aspect of the  penis with Steri-Strips.  The patient tolerated the procedure well and  transferred to the recovery room in satisfactory condition.           D:  07/11/2016 08:43 AM by Dr. Ann Held. Karel Jarvis, MD [phone])  T:  07/11/2016 14:23 PM by NTS      (Conf: [id number]) (Doc ID: [id number])

## 2016-07-11 NOTE — PACU (Signed)
Pt seen by Anesthesia, updated pt's condition, VSS . Ok'd to be discharged after 1 hr in PACU.

## 2016-07-11 NOTE — Discharge Instr - AVS First Page (Signed)
Reason for your Hospital Admission:  Ureteral stone      Instructions for after your discharge:  [facility]    DR. Sandrine Bloodsworth  [address]  [place], [place]  [address]  [phone]  Post Operative Surgical Instructions     You may eat and drink what you like, but advance your diet from liquids to regular intake slowly.  If you feel nauseated, be sure to try liquids first and when these are tolerated, you may proceed ahead to a regular diet.  If you have excessive nausea and vomiting, which does not promptly resolve, please call us.  You should keep your urine flowing freely and drink plenty of water, 8-10 glasses a day.  You may expect some spotting (blood in the urine) for 48-72 hours after your procedure.  This is not unusual and should clear quickly with bed rest and fluid intake.  You may well have burning on urination for a few days if you had instrumentation to your urethra (cystoscopy).  This again is not unusual and will clear relatively promptly.  In returning to your normal activities, you should let discomfort be your guide, and if it increases your pain, any activity should be avoided. Don't however; be afraid of physical activity, as you should be up and around.  If you have discomfort, extra strength Tylenol may be used, of if we have prescribed any other medications, please be sure to follow the instructions on the bottle.  If you have a fever of more than 1oo degree, excessive pain or swelling, please call us promptly.  If you have had an incision, dressing should be removed the day following surgery and you can resume showers or bathing the following day (unless otherwise instructed).  Stitches will be of the absorbable type (unless you are instructed otherwise) and should dissolve out with bathing in 1-2 weeks.  The schedule at the hospital is a hectic one, and your surgeon wants to discuss all of the findings of your procedure  with you.  Please make a follow-up appointment.  Obviously, any problems needing immediate attention will be taken care of while you are at the hospital.  The doctor, if at all possible, will want to talk to you as soon as the operating schedule allows, and please be available in the patient waiting area.  IF you have missed connections post-operatively, please notify the volunteers so that the doctor knows you are waiting.  If you are going home with a foley catheter call if there is a problem with drainage into the bag.  If not draining or if you are experiencing a lower abdominal pains call your physician immediately.  Make an appointment with your physician's office for catheter removal per instructions.  Please call the office for any questions that are not covered and that cannot wait for our follow appointment           Post Anesthesia Discharge Instructions    Although you may be awake and alert in the recovery room, small amounts of anesthetic remain in your system for about 24 hours.  You may feel tired and sleepy during this time.      You are advised to go directly home from the hospital.    Plan to stay at home and rest for the remainder of the day.    It is advisable to have someone with you at home for 24 hours after surgery.      Do not operate a motor vehicle, or any mechanical or electrical equipment for the next 24 hours.      Be careful when you are walking around, you may become dizzy.  The effects of anesthesia and/or medications are still present and drowsiness may occur    Do not consume alcohol, tranquilizers, sleeping medications, or any other non prescribed medication for the remainder of the day.    Diet:  begin with liquids, progress your diet as tolerated or as directed by your surgeon.  Nausea and vomiting may occur in the next 24 hours.

## 2016-07-16 ENCOUNTER — Telehealth (INDEPENDENT_AMBULATORY_CARE_PROVIDER_SITE_OTHER): Payer: Self-pay

## 2016-07-16 ENCOUNTER — Encounter (INDEPENDENT_AMBULATORY_CARE_PROVIDER_SITE_OTHER): Payer: Self-pay | Admitting: Endocrinology, Diabetes and Metabolism

## 2016-07-16 NOTE — Telephone Encounter (Signed)
Returned patient's call regarding scheduling an appointment. Patient did not answer. Left a message for patient to call back to schedule his follow up.

## 2016-07-21 ENCOUNTER — Encounter: Payer: Self-pay | Admitting: Urology

## 2016-07-21 ENCOUNTER — Encounter (INDEPENDENT_AMBULATORY_CARE_PROVIDER_SITE_OTHER): Payer: Self-pay | Admitting: Endocrinology, Diabetes and Metabolism

## 2016-08-11 ENCOUNTER — Encounter (INDEPENDENT_AMBULATORY_CARE_PROVIDER_SITE_OTHER): Payer: Self-pay | Admitting: Endocrinology, Diabetes and Metabolism

## 2016-08-11 ENCOUNTER — Ambulatory Visit (INDEPENDENT_AMBULATORY_CARE_PROVIDER_SITE_OTHER): Admitting: Endocrinology, Diabetes and Metabolism

## 2016-08-11 VITALS — BP 121/77 | HR 79 | Ht 75.0 in | Wt 306.0 lb

## 2016-08-11 DIAGNOSIS — IMO0001 Reserved for inherently not codable concepts without codable children: Secondary | ICD-10-CM

## 2016-08-11 DIAGNOSIS — E1165 Type 2 diabetes mellitus with hyperglycemia: Secondary | ICD-10-CM

## 2016-08-11 MED ORDER — EXENATIDE ER 2 MG/0.85ML SC AUIJ
2.0000 mg | AUTO-INJECTOR | SUBCUTANEOUS | 3 refills | Status: DC
Start: 2016-08-11 — End: 2016-11-25

## 2016-08-11 MED ORDER — FREESTYLE LANCETS MISC
3 refills | Status: DC
Start: 2016-08-11 — End: 2017-12-16

## 2016-08-11 MED ORDER — GLIPIZIDE ER 2.5 MG PO TB24
2.5000 mg | ORAL_TABLET | Freq: Every day | ORAL | 1 refills | Status: DC
Start: 2016-08-11 — End: 2017-01-12

## 2016-08-11 MED ORDER — METFORMIN HCL 1000 MG PO TABS
1000.0000 mg | ORAL_TABLET | Freq: Two times a day (BID) | ORAL | 1 refills | Status: DC
Start: 2016-08-11 — End: 2017-01-12

## 2016-08-11 MED ORDER — FREESTYLE LITE TEST VI STRP
ORAL_STRIP | 3 refills | Status: DC
Start: 2016-08-11 — End: 2017-12-16

## 2016-08-11 NOTE — Patient Instructions (Signed)
Please note that in the future if you need prescription refills, plan ahead and call our clinic during normal clinic hours only to ensure these get done in a timely fashion.  If you are overdue for your follow up appointment, we may ask that you come in for a visit before refilling the medications.  Thanks for your understanding.    Be sure you arrive to your appointments on time in the future.  If you are late by 10 minutes or more, we may ask you reschedule because it is discourteous to patients on our schedule who do come in on time to ask them to wait because you were late.    Recommended follow-up from today's visit: 2-3 months; be sure to get your next labs 1-2 weeks before next visit      Be sure to bring your glucose meter or blood sugar log next visit for review of sugars.    Diabetes General Instructions:  Take A+ care of yourself!  Failure to "get the results" can lead to the devastating complications of diabetes.  You can help to avoid this misery by following our advice, and by controlling all aspects of your disease to the best of your ability.  I look forward to helping you improve your control.  1.  Meal Plan:   a.  Control your calorie intake to avoid gaining weight.   b.  Please eat 3 regular meals, don't skip. Eat very healthy foods, including vegetables, fruits, whole grains, nuts (in limitation), and fish.  Use olive or canola oil as your oil source.  No fruit juices or regular sodas.   c.  Sodium restriction <1500 mg daily.  2.  Exercise:   Activity:  Consider an activity you can be comfortable with such as walking, jogging, running, swimming, or weights.  Try to walk at least 5 days a week for 30 minutes each day.  Start slowly and gradually build up your stamina.  You can do this.  Find activities that you enjoy!  Be persistent and enthusiastic. Don't injure yourself and do not exercise to the point of exhaustion.  Be very careful.  Consult your MD.  3.  Daily careful shoe and foot exam.  If  you cannot see your feet clearly and entirely please either use a mirror and/or ask a relative to do the exam for you (see below)  4.  Blood Sugar Monitoring:       a. Please check your sugars 1-2 times each day.  Record your glucose values in a log.  Please bring your glucose readings or meter to each and every visit with each of your diabetes care-providers.  If you are checking your sugars twice daily you can vary the times: before breakfast and dinner one day, and before lunch and 2 hours after dinner the next day.  It is important to check your sugars 2 hours after meals to see how high the glucose readings are at this time.  If you are on short acting and long acting insulins, you need to check sugars before each meal and bedtime at the very least.       b.  Ideal blood sugar levels 80 to 130 before and 80 to 160 2hr after meals.  5.  Yearly ophthalmology exam- be sure to have your ophthalmologist forward [place] a copy of his exam report.  See your dentist every 6 months.  Get flu shot yearly.  6.  Labs: typically get comprehensive metabolic panel  and fasting lipid panel once per year (if controlled); A1c tests are done 2-4 times per year (depending on control)  7.  Continued follow-up: every 3 months (or every 6 months if excellent control).  8.  Insulin can be injected anywhere where you can pinch up fat (typically belly, inner thighs or outside of arms).  9.  Most diabetes medications (other than metformin and Actos) can cause low blood sugars.  If you experience symptoms such as shakiness, weakness, confusion, lightheadedness, or sweats, this may indicate your blood sugar is low.  In this situation, please check your blood sugar.  If it is low (<70), have a small snack such as 4 oz of orange juice, 1 cup of milk, 4-5 pieces of hard candy or 3-4 glucose tablets.  Recheck sugar in 15 minutes.  If not improved, can repeat as needed.  10.  Blood pressure surveillance and cholesterol surveillance are also important  parts of minimizing the risk for heart disease in the future.  Typically the goal blood pressure is < 130/80 and goal LDL (bad cholesterol) is <100 (for patient already with heart disease the LDL should be <70).  11.  Regarding medication for your diabetes: continue metformin 1000 mg twice daily with meals and hold Glipizide 2.5 mg in AM for now; start Bydureon once weekly.  After a few weeks if sugars remain above goal, you may restart Glipizide 2.5 mg once daily; after another few weeks if sugars still remain above goal you may increase further to twice daily  12.  Diabetes foot care DO's and DON'TS:  DO's:  . Wash your feet daily with warm water & soap.  . Never soak your feet without first checking the temperature of the water by hand  . Dry your feet well, especially between the toes  . If the skin on your feet is dry, apply lotion to them everyday after bathing.  If your feet sweat a large amount, apply powder to them everyday  . Check your feet daily for blisters, cuts, sores, redness or swelling.  Check carefully between your toes.  Use a mirror to check the bottom of your feet.  Notify your doctor right away if you find something wrong  . Use an emery board gently to shape toenails even with the ends of the toes  . File calluses or rough skin with an emery board to remove dead skin.  This should be done after washing the feet to help soften the skin.  . Cut your toenails straight across (to prevent ingrown toenails)  . Wear socks or stockings without seams or bumps that are clean and soft and well fitting  . Keep your feet warm and dry  . Wear shoes that fit well and do not rub toes or heels  . Examine your shoes daily for cracks, pebbles, nails or anything that could hurt your feet  . See your podiatrist (foot doctor) if you have a wound that is not healing    DON'Ts:  . Never walk barefoot indoors or outdoors  . Never use a hot water bottle or heating pad on your feet  . Don't put lotion between your  toes  . Never use a knife or razor blade to cut your toenails or feet  . Don't use chemicals or corn and callous removers yourself  . Never rip off a hangnail  . Never wear garters, tight socks or other clothing which cuts off circulation to your feet  . Don't sit  with your legs crossed at the knees as this slows blood flow to your feet  . Don't smoke!    Starting Victoza      You are about to start taking Victoza.  We will provide you with a sample pen and instruct you how to use it.  Take this medication by injecting it once daily, preferably in the morning.  Since Victoza can lower your blood sugars, we may need to adjust your other medications to attempt to avoid severe low blood sugars.  When starting Victoza you will start with a very low dose to see if you can tolerate this medication.  You will then increase the dose according to this schedule:  a. First dose:  "Test Dose"  b. Second through fifth doses:  0.6 mg daily  c. Sixth through ninth doses:    1.2 mg daily  d. After the ninth dose:              1.8 mg daily    If you feel very ill while taking Victoza please stop it immediately and call your physician.  If you feel mildly uncomfortable when increasing the dose of Victoza you can either stay on that higher dose and see if you feel better over time, or you can go back to the next lower dose.    Victoza is a relatively new injectable medication for diabetes.  It tends to reduce appetite leading to weight loss.  Victoza can cause nausea, constipation, diarrhea, headaches and other side-effects.  The most dangerous side-effect is very rare, namely pancreatitis.  If you are taking Victoza and you have severe abdominal pain, nausea, vomiting, and/or diarrhea please go immediately to the emergency room.  Lastly, a minority of rats given high doses of Victoza had a rare form of thyroid cancer.       You are instructed to contact your doctor while taking Victoza if:    a. You experience severe abdominal pain,  nausea, constipation, headaches, or diarrhea.  b. Your blood sugar decreases to less than 70 mg/dL on a frequent basis, or to less than 60 mg/dL even one time.  c. You develop a rash on your body or at the injection sites.

## 2016-08-11 NOTE — Progress Notes (Addendum)
Subjective:       Patient ID: Francis Trevino is a 61 y.o. male.    HPI    Francis Trevino is following up (self-referred) for consultation regarding diabetes.    Francis Trevino is a 61 y.o. -year-old male with a history of diabetes since 2012.    In terms of severity with regards to complications, the patient does not have retinopathy, does not have nephropathy, does not have neuropathy, and does not have cardiovascular disease.     With regards to the quality of blood sugar control, the most recent hemoglobin A1c test was 7.3 via [facility] testing in February.  The patient checks blood sugars 2 times per day (meter download shows range of AM 157-224 and evening of 130-319).   Current anti-hyperglycemic regimen is comprised of metformin 1000 mg bid, glipizide 2.5 mg qd.  Other medications tried in the past include: none     In terms of associated symptoms at this time, the patient does not have polyuria, does not have blurred vision, does not have paresthesias.  With regards to gastroparesis symptoms, the patient does not have nausea and does not have diarrhea    Per the patient's history, last fundoscopic exam was 2 years ago and last podiatry exam was in 2017.  Patient had a 0 lb weight change in the past 6 months.    .   Currently the patient's diet comprises of 3 meals per day.  Prior formal diabetes education/ nutritional counseling has been performed.       Current frequency of hypoglycemia is rare.  Symptoms include unknown.  The patient does not have a medic alert bracelet/ID that indicates diabetes status.  The patient does not have an updated glucagon emergency kit.  The patient currently lives with wife.    Is hoping to resume work in trucking      Past Medical History:   Diagnosis Date   . Abnormal vision     wear glasses   . Arthritis    . Calcium oxalate kidney stones 05/2010    s/p lithotripsy, and "gout stone"   . Complication of anesthesia     knee scope, "hard time keeping BP up"   . Diabetes mellitus    .  History of gout    . Hyperlipidemia    . Hypertension    . Kidney calculi 05/2010    s/p lithotripsy   . Pain in Achilles tendon 2007   . Rash     scalp dryness, h/o Rosacea   . Rosacea    . Sleep apnea     uses CPAP   . Type 2 diabetes mellitus, controlled      Past Surgical History:   Procedure Laterality Date   . ACHILLES TENDON SURGERY Right 2005,2006    right x2   . CYSTOSCOPY, RETROGRADE STENT PLACEMENT Left 07/11/2016    Procedure: CYSTOSCOPY, RETROGRADE STENT PLACEMENT LEFT;  Surgeon: Prescilla Sours, MD;  Location: [facility];  Service: Urology;  Laterality: Left;   . CYSTOSCOPY, URETEROSCOPY, LASER LITHOTRIPSY Left 07/11/2016    Procedure: CYSTOSCOPY, URETEROSCOPY;  Surgeon: Prescilla Sours, MD;  Location: [facility];  Service: Urology;  Laterality: Left;   . ENDOSCOPIC SINUS SURGERY (FESS) N/A 03/31/2016    Procedure: ENDOSCOPIC SINUS SURGERY (FESS), MAXILLARY ANTROSTOMY WITH MAXILLARY TISSUE REMOVAL;  Surgeon: Jaquelyn Bitter, MD;  Location: [facility];  Service: ENT;  Laterality: N/A;  Stereolactice computer assisted (navigational) cranial, extradural (image  guidance for sinus surgery)   . ETHMOIDECTOMY N/A 03/31/2016    Procedure: ENDOSCOPIC ETHMOIDECTOMY, TOTAL ANTERIOR AND POSTERIOR;  Surgeon: Jaquelyn Bitter, MD;  Location: [facility];  Service: ENT;  Laterality: N/A;   . iris nevus  06/1991    removed   . KNEE ARTHROSCOPY  01/2008    left knee   . LITHOTRIPSY      x2   . SUSPENSION, HYOID N/A 03/31/2016    Procedure: SUSPENSION, HYOID MYOTOMY;  Surgeon: Jaquelyn Bitter, MD;  Location: [facility];  Service: ENT;  Laterality: N/A;   . TURBINOPLASTY/SMR N/A 03/31/2016    Procedure: TURBINATES RESECTIONS USING SUBMUCOSAL APPROACH, SEPTO/SMR W/WO CARTILAGE SCORING/CONTOURING Charline Bills;  Surgeon: Jaquelyn Bitter, MD;  Location: [facility];  Service: ENT;  Laterality: N/A;     Family History   Problem Relation Age of Onset   . Alzheimer's disease Mother    . Cancer Mother       skin   . Heart disease Father      x5 Bypasses   . Diabetes Father    . Cancer Father      prostate, skin   . Hypertension Brother    . Diabetes Brother    . Hyperlipidemia Neg Hx    . Kidney disease Neg Hx    . Stroke Neg Hx    . Colon cancer Neg Hx    . Malignant hyperthermia Neg Hx    . Anesthesia problems Neg Hx      Social History   Substance Use Topics   . Smoking status: Former Smoker     Packs/day: 1.50     Years: 20.00     Types: Cigarettes     Quit date: 03/20/1996   . Smokeless tobacco: Never Used   . Alcohol use Yes      Comment: Infrequent         Review of Systems   Eyes: Negative for visual disturbance.   Endocrine: Negative for polyuria.   Neurological: Negative for numbness.           Objective:    Physical Exam   Constitutional: He appears well-developed.   Obese   Psychiatric: He has a normal mood and affect. Judgment normal.       On prior exam from 04/22/16:  Obese   HENT:   Mouth/Throat: Oropharynx is clear and moist. Normal dentition.   Eyes: EOM are normal. Pupils are equal, round, and reactive to light.   Left eye iris irregular   Neck: No tracheal deviation present. No thyroid mass and no thyromegaly present.   Cardiovascular: Normal rate and regular rhythm.    No murmur heard.  Pulses:       Carotid pulses are 2+ on the right side, and 2+ on the left side.  No carotid bruits   Pulmonary/Chest: Effort normal and breath sounds normal.   Abdominal: There is no hepatosplenomegaly. There is no tenderness.   Neurological:   Reflex Scores:       Achilles reflexes are 0 on the right side and 0 on the left side.  Foot exam: intact monofilament (except right big toe diminished), vibratory and foot proprioception bilaterally   Skin:   On inspection and palpation, no skin breakdown, ulceration, corns or calluses.  Dry skin on big toes   Psychiatric: He has a normal mood and affect. Judgment normal.       Prior labs: 02/26/12: A1c 6.7, Tchol 116, TG 68,  HDL 33, LDL 49, Cr 1.23, nl LFTs, Ca 9.1, Na 139, K  4.1; 03/28/12: 8 AM: plasma frac metanephrines 83 (<205), PRA 15.35 (0.25-5.82), Aldosterone 2 ng/dl- PRA elevated likely on context of HCTZ and ACE inhibitor; salivary cortisols 0.07, 0.13- not concerning for [place].  No concerning evidence for hyperfunctionality.  06/28/14: A1c 8.3, ualb/Cr 71, Hct 44.6, Cr 1.0, APhos 109, Tchol 162, TG 270, HDL 32, LDL 76, TSH 2.08; 60/45/40: [facility] labs: Cr 1.1, A1c 9.6, nl LFTs, Tchol 119, TG 219, HDL 32, LDL 43, ualb/Cr 12.6; 04/22/16: [facility] A1c 7.3; 07/10/16: [facility] labs: Cr 1.5, nl LFTs, Ca 8.8, Hct 37.3; ADDENDUM: 12/23/16: A1c 6.4, Cr 1.3, Ca 9.4, nl LFTs, ualb/Cr 6- has f/u on 11/6    Prior radiology: [place] CT abd/pelvis 01/2008: Bilateral low density adrenal gland nodules are seen, largest on the right measuring 2 cm. Bilateral low density adrenal gland nodules, probable adenomas. Bilateral nonobstructing renal calculi. 05/2011: [place] CT abd/pelvis adrenal adenomas unchanged.      [facility] 03/24/12: Compared to  imaging from 01/2008: bilateral adrenal adenomas noted with right side largest at 2 cm (unchanged from 2009) low attenation.      Assessment:       61 y.o. -year-old male with history of type 2 diabetes without known complications.        Plan:       1. Diabetes type 2 (worsening): Will check hemoglobin A1c again with labs for next visit.  Based on recent blood sugar pattern of elevated fasting sugars and prandial/post-prandial sugars, will adjust medication/insulin as follows: continue metformin 1000 mg bid and hold Glipizide 2.5 mg daily for now and will attempt a trial of weekly Bydureon SC; written instructions regarding side effects and pen demonstration provided today.  Also suggested after a few weeks if sugars remain above goal could restart Glipizide 2.5 mg once daily followed by bid if needed for high sugars.  Check sugars AM fasting and 2 hours after biggest meal of day with goal fasting/pre-meal range of 80-130; goal postprandial  range of 80-160.  Is due for eye exam; will screen for nephropathy and check spot urine microalbumin/Cr and CMP with labs for next visit to make sure Cr settles down.  Consider seeing podiatry for foot care in the future.  Continue carbohydrate-controlled diet.  F/U in 2-3 months to reassess.    2. Hypertension (stable): goal blood pressure is <130/80.  Controlled on regimen of HCTZ 25 mg, Lisinopril 20 mg,     3. Hyperlipidemia (stable): controlled on Zocor 20 mg; goal LDL<100.  Continue low cholesterol, low saturated fat diet.    4. Overweight status/Obesity (stable): counseled patient on at least 150 min/wk of moderate-intensity exercise and consider flexibility/strength training exercises if possible.  Physical activity programs should begin slowly and build up gradually; nutritional counseling status.    5. Hypoglycemia management (stable): counseled patient on need to keep sugar source with him at all times and medic-alert bracelet/indicator specifying diagnosis of diabetes    6. Adrenal adenoma (stable): given prior workup was nonrevealing,will defer on further imaging tests given nearly 5 years of surveillance; could consider repeat hormonal evaluation in future if symptoms worsen.          Procedures

## 2016-08-26 ENCOUNTER — Ambulatory Visit
Admission: RE | Admit: 2016-08-26 | Discharge: 2016-08-26 | Disposition: A | Discharge status: Home / Self Care | Source: Ambulatory Visit | Attending: Nephrology | Admitting: Nephrology

## 2016-08-26 DIAGNOSIS — E119 Type 2 diabetes mellitus without complications: Secondary | ICD-10-CM | POA: Insufficient documentation

## 2016-08-26 DIAGNOSIS — N179 Acute kidney failure, unspecified: Secondary | ICD-10-CM | POA: Insufficient documentation

## 2016-08-26 DIAGNOSIS — N200 Calculus of kidney: Secondary | ICD-10-CM | POA: Insufficient documentation

## 2016-08-26 DIAGNOSIS — I10 Essential (primary) hypertension: Secondary | ICD-10-CM | POA: Insufficient documentation

## 2016-08-26 DIAGNOSIS — E669 Obesity, unspecified: Secondary | ICD-10-CM | POA: Insufficient documentation

## 2016-08-26 LAB — RENAL FUNCTION PANEL
Albumin: 3.8 g/dL (ref 3.5–5.0)
BUN: 22 mg/dL (ref 9.0–28.0)
CO2: 23 mEq/L (ref 21–29)
Calcium: 9.6 mg/dL (ref 8.5–10.5)
Chloride: 105 mEq/L (ref 100–111)
Creatinine: 1.2 mg/dL (ref 0.5–1.5)
Glucose: 166 mg/dL — ABNORMAL HIGH (ref 70–100)
Phosphorus: 3.2 mg/dL (ref 2.3–4.7)
Potassium: 4.1 mEq/L (ref 3.5–5.1)
Sodium: 136 mEq/L (ref 136–145)

## 2016-08-26 LAB — URINALYSIS WITH MICROSCOPIC
Bilirubin, UA: NEGATIVE
Blood, UA: NEGATIVE
Glucose, UA: 100 — AB
Ketones UA: NEGATIVE
Leukocyte Esterase, UA: NEGATIVE
Nitrite, UA: NEGATIVE
Protein, UR: NEGATIVE
RBC, UA: 0 /hpf (ref 0–5)
Specific Gravity UA: 1.013 (ref 1.001–1.035)
Urine pH: 5.5 (ref 5.0–8.0)
Urobilinogen, UA: 0.2

## 2016-08-26 LAB — HEMOGLOBIN A1C
Average Estimated Glucose: 174.3 mg/dL
Hemoglobin A1C: 7.7 % — ABNORMAL HIGH (ref 4.6–5.9)

## 2016-08-26 LAB — PTH, INTACT: PTH Intact: 101 pg/mL — ABNORMAL HIGH (ref 9.0–72.0)

## 2016-08-26 LAB — HEMOLYSIS INDEX: Hemolysis Index: 7 (ref 0–18)

## 2016-08-26 LAB — GFR: EGFR: >60

## 2016-08-26 LAB — VITAMIN D,25 OH,TOTAL: Vitamin D, 25 OH, Total: 21 ng/mL — ABNORMAL LOW (ref 30–100)

## 2016-08-26 LAB — URIC ACID: Uric acid: 7.9 mg/dL (ref 3.6–9.7)

## 2016-09-30 ENCOUNTER — Encounter (INDEPENDENT_AMBULATORY_CARE_PROVIDER_SITE_OTHER): Payer: Self-pay | Admitting: Endocrinology, Diabetes and Metabolism

## 2016-11-03 ENCOUNTER — Ambulatory Visit (INDEPENDENT_AMBULATORY_CARE_PROVIDER_SITE_OTHER): Admitting: Endocrinology, Diabetes and Metabolism

## 2016-11-25 ENCOUNTER — Encounter (INDEPENDENT_AMBULATORY_CARE_PROVIDER_SITE_OTHER): Payer: Self-pay | Admitting: Endocrinology, Diabetes and Metabolism

## 2016-11-25 DIAGNOSIS — IMO0001 Reserved for inherently not codable concepts without codable children: Secondary | ICD-10-CM

## 2016-11-26 MED ORDER — EXENATIDE ER 2 MG/0.85ML SC AUIJ
2.0000 mg | AUTO-INJECTOR | SUBCUTANEOUS | 1 refills | Status: DC
Start: 2016-11-26 — End: 2017-01-12

## 2016-11-26 NOTE — Telephone Encounter (Signed)
Rx printed. Faxed successfully to [facility], [place], [place]

## 2016-12-23 ENCOUNTER — Other Ambulatory Visit (FREE_STANDING_LABORATORY_FACILITY)

## 2016-12-23 DIAGNOSIS — E1165 Type 2 diabetes mellitus with hyperglycemia: Secondary | ICD-10-CM

## 2016-12-23 DIAGNOSIS — E119 Type 2 diabetes mellitus without complications: Secondary | ICD-10-CM

## 2016-12-23 DIAGNOSIS — IMO0001 Reserved for inherently not codable concepts without codable children: Secondary | ICD-10-CM

## 2016-12-23 DIAGNOSIS — N200 Calculus of kidney: Secondary | ICD-10-CM

## 2016-12-23 DIAGNOSIS — E669 Obesity, unspecified: Secondary | ICD-10-CM

## 2016-12-23 DIAGNOSIS — N179 Acute kidney failure, unspecified: Secondary | ICD-10-CM

## 2016-12-23 DIAGNOSIS — I10 Essential (primary) hypertension: Secondary | ICD-10-CM

## 2016-12-23 LAB — RENAL FUNCTION PANEL
Albumin: 4 g/dL (ref 3.5–5.0)
BUN: 22 mg/dL (ref 9.0–28.0)
CO2: 26 mEq/L (ref 21–29)
Calcium: 9.4 mg/dL (ref 8.5–10.5)
Chloride: 103 mEq/L (ref 100–111)
Creatinine: 1.2 mg/dL (ref 0.5–1.5)
Glucose: 138 mg/dL — ABNORMAL HIGH (ref 70–100)
Phosphorus: 3.3 mg/dL (ref 2.3–4.7)
Potassium: 4.1 mEq/L (ref 3.5–5.1)
Sodium: 138 mEq/L (ref 136–145)

## 2016-12-23 LAB — COMPREHENSIVE METABOLIC PANEL
ALT: 15 U/L (ref 0–55)
AST (SGOT): 16 U/L (ref 5–34)
Albumin/Globulin Ratio: 1.4 (ref 0.9–2.2)
Albumin: 4 g/dL (ref 3.5–5.0)
Alkaline Phosphatase: 72 U/L (ref 38–106)
BUN: 20 mg/dL (ref 9.0–28.0)
Bilirubin, Total: 0.6 mg/dL (ref 0.1–1.2)
CO2: 27 mEq/L (ref 21–29)
Calcium: 9.4 mg/dL (ref 8.5–10.5)
Chloride: 103 mEq/L (ref 100–111)
Creatinine: 1.3 mg/dL (ref 0.5–1.5)
Globulin: 2.8 g/dL (ref 2.0–3.7)
Glucose: 143 mg/dL — ABNORMAL HIGH (ref 70–100)
Potassium: 4.2 mEq/L (ref 3.5–5.1)
Protein, Total: 6.8 g/dL (ref 6.0–8.3)
Sodium: 139 mEq/L (ref 136–145)

## 2016-12-23 LAB — HEMOLYSIS INDEX
Hemolysis Index: 3 (ref 0–18)
Hemolysis Index: 9 (ref 0–18)

## 2016-12-23 LAB — URIC ACID: Uric acid: 10.6 mg/dL — ABNORMAL HIGH (ref 3.6–9.7)

## 2016-12-23 LAB — HEMOGLOBIN A1C
Average Estimated Glucose: 137 mg/dL
Hemoglobin A1C: 6.4 % — ABNORMAL HIGH (ref 4.6–5.9)

## 2016-12-23 LAB — MICROALBUMIN, RANDOM URINE
Urine Creatinine, Random: 173.4 mg/dL
Urine Microalbumin, Random: 11 (ref 0.0–30.0)
Urine Microalbumin/Creatinine Ratio: 6 ug/mg (ref 0–30)

## 2016-12-23 LAB — GFR
EGFR: >60
EGFR: 56.3

## 2016-12-23 LAB — PTH, INTACT: PTH Intact: 112.6 pg/mL — ABNORMAL HIGH (ref 9.0–72.0)

## 2017-01-12 ENCOUNTER — Encounter (INDEPENDENT_AMBULATORY_CARE_PROVIDER_SITE_OTHER): Payer: Self-pay | Admitting: Endocrinology, Diabetes and Metabolism

## 2017-01-12 ENCOUNTER — Ambulatory Visit (INDEPENDENT_AMBULATORY_CARE_PROVIDER_SITE_OTHER): Admitting: Endocrinology, Diabetes and Metabolism

## 2017-01-12 DIAGNOSIS — IMO0001 Reserved for inherently not codable concepts without codable children: Secondary | ICD-10-CM

## 2017-01-12 DIAGNOSIS — E785 Hyperlipidemia, unspecified: Secondary | ICD-10-CM

## 2017-01-12 DIAGNOSIS — E663 Overweight: Secondary | ICD-10-CM

## 2017-01-12 DIAGNOSIS — I10 Essential (primary) hypertension: Secondary | ICD-10-CM

## 2017-01-12 DIAGNOSIS — E1165 Type 2 diabetes mellitus with hyperglycemia: Secondary | ICD-10-CM

## 2017-01-12 DIAGNOSIS — Z7984 Long term (current) use of oral hypoglycemic drugs: Secondary | ICD-10-CM

## 2017-01-12 DIAGNOSIS — D3500 Benign neoplasm of unspecified adrenal gland: Secondary | ICD-10-CM

## 2017-01-12 MED ORDER — METFORMIN HCL 1000 MG PO TABS
1000.0000 mg | ORAL_TABLET | Freq: Two times a day (BID) | ORAL | 1 refills | Status: DC
Start: 2017-01-12 — End: 2018-06-15

## 2017-01-12 MED ORDER — EXENATIDE ER 2 MG/0.85ML SC AUIJ
2.0000 mg | AUTO-INJECTOR | SUBCUTANEOUS | 1 refills | Status: DC
Start: 2017-01-12 — End: 2017-07-13

## 2017-01-12 NOTE — Progress Notes (Addendum)
Subjective:       Patient ID: Francis Trevino is a 61 y.o. male.    HPI    Francis Trevino is following up (self-referred) for consultation regarding diabetes.    Francis Trevino is a 61 y.o. -year-old male with a history of diabetes since 2012.    In terms of severity with regards to complications, the patient does not have retinopathy, does not have nephropathy, does not have neuropathy, and does not have cardiovascular disease.     With regards to the quality of blood sugar control, the most recent hemoglobin A1c test was 6.4 in October.  The patient checks blood sugars 2 times per day (meter download shows range of AM 157-224 and evening of 130-319).   Current anti-hyperglycemic regimen is comprised of metformin 1000 mg bid, weekly bydureon SC.  Other medications tried in the past include: glipizide 2.5 mg qd     In terms of associated symptoms at this time, the patient does not have polyuria, does not have blurred vision, does not have paresthesias.  With regards to gastroparesis symptoms, the patient does not have nausea and does not have diarrhea    Per the patient's history, last fundoscopic exam was 2 years ago and last podiatry exam was in 2017.  Patient had a 10 lb weight decrease in the past 5 months.    .   Currently the patient's diet comprises of 3 meals per day.  Prior formal diabetes education/ nutritional counseling has been performed.       Current frequency of hypoglycemia is rare.  Symptoms include unknown.  The patient does not have a medic alert bracelet/ID that indicates diabetes status.  The patient does not have an updated glucagon emergency kit.  The patient currently lives with wife.    Is hoping to resume work in trucking        Past Medical History:   Diagnosis Date   . Abnormal vision     wear glasses   . Arthritis    . Calcium oxalate kidney stones 05/2010    s/p lithotripsy, and "gout stone"   . Complication of anesthesia     knee scope, "hard time keeping BP up"   . Diabetes mellitus     . History of gout    . Hyperlipidemia    . Hypertension    . Kidney calculi 05/2010    s/p lithotripsy   . Pain in Achilles tendon 2007   . Rash     scalp dryness, h/o Rosacea   . Rosacea    . Sleep apnea     uses CPAP   . Type 2 diabetes mellitus, controlled      Past Surgical History:   Procedure Laterality Date   . ACHILLES TENDON SURGERY Right 2005,2006    right x2   . CYSTOSCOPY, RETROGRADE STENT PLACEMENT Left 07/11/2016    Procedure: CYSTOSCOPY, RETROGRADE STENT PLACEMENT LEFT;  Surgeon: Prescilla Sours, MD;  Location: [facility];  Service: Urology;  Laterality: Left;   . CYSTOSCOPY, URETEROSCOPY, LASER LITHOTRIPSY Left 07/11/2016    Procedure: CYSTOSCOPY, URETEROSCOPY;  Surgeon: Prescilla Sours, MD;  Location: [facility];  Service: Urology;  Laterality: Left;   . ENDOSCOPIC SINUS SURGERY (FESS) N/A 03/31/2016    Procedure: ENDOSCOPIC SINUS SURGERY (FESS), MAXILLARY ANTROSTOMY WITH MAXILLARY TISSUE REMOVAL;  Surgeon: Jaquelyn Bitter, MD;  Location: [facility];  Service: ENT;  Laterality: N/A;  Stereolactice computer assisted (navigational) cranial, extradural (  image guidance for sinus surgery)   . ETHMOIDECTOMY N/A 03/31/2016    Procedure: ENDOSCOPIC ETHMOIDECTOMY, TOTAL ANTERIOR AND POSTERIOR;  Surgeon: Jaquelyn Bitter, MD;  Location: [facility];  Service: ENT;  Laterality: N/A;   . iris nevus  06/1991    removed   . KNEE ARTHROSCOPY  01/2008    left knee   . LITHOTRIPSY      x2   . SUSPENSION, HYOID N/A 03/31/2016    Procedure: SUSPENSION, HYOID MYOTOMY;  Surgeon: Jaquelyn Bitter, MD;  Location: [facility];  Service: ENT;  Laterality: N/A;   . TURBINOPLASTY/SMR N/A 03/31/2016    Procedure: TURBINATES RESECTIONS USING SUBMUCOSAL APPROACH, SEPTO/SMR W/WO CARTILAGE SCORING/CONTOURING Charline Bills;  Surgeon: Jaquelyn Bitter, MD;  Location: [facility];  Service: ENT;  Laterality: N/A;     Family History   Problem Relation Age of Onset   . Alzheimer's disease Mother    . Cancer Mother          skin   . Heart disease Father         x5 Bypasses   . Diabetes Father    . Cancer Father         prostate, skin   . Hypertension Brother    . Diabetes Brother    . Hyperlipidemia Neg Hx    . Kidney disease Neg Hx    . Stroke Neg Hx    . Colon cancer Neg Hx    . Malignant hyperthermia Neg Hx    . Anesthesia problems Neg Hx      Social History   Substance Use Topics   . Smoking status: Former Smoker     Packs/day: 1.50     Years: 20.00     Types: Cigarettes     Quit date: 03/20/1996   . Smokeless tobacco: Never Used   . Alcohol use Yes      Comment: Infrequent         Review of Systems   Eyes: Negative for visual disturbance.   Endocrine: Negative for polyuria.   Neurological: Negative for numbness.           Objective:    Physical Exam   Constitutional: He appears well-developed.   Obese   Psychiatric: He has a normal mood and affect. Judgment normal.         On prior exam from 04/22/16:  Obese   HENT:   Mouth/Throat: Oropharynx is clear and moist. Normal dentition.   Eyes: EOM are normal. Pupils are equal, round, and reactive to light.   Left eye iris irregular   Neck: No tracheal deviation present. No thyroid mass and no thyromegaly present.   Cardiovascular: Normal rate and regular rhythm.    No murmur heard.  Pulses:       Carotid pulses are 2+ on the right side, and 2+ on the left side.  No carotid bruits   Pulmonary/Chest: Effort normal and breath sounds normal.   Abdominal: There is no hepatosplenomegaly. There is no tenderness.   Neurological:   Reflex Scores:       Achilles reflexes are 0 on the right side and 0 on the left side.  Foot exam: intact monofilament (except right big toe diminished), vibratory and foot proprioception bilaterally   Skin:   On inspection and palpation, no skin breakdown, ulceration, corns or calluses.  Dry skin on big toes   Psychiatric: He has a normal mood and affect. Judgment normal.  Prior labs: 02/26/12: A1c 6.7, Tchol 116, TG 68, HDL 33, LDL 49, Cr 1.23, nl LFTs,  Ca 9.1, Na 139, K 4.1; 03/28/12: 8 AM: plasma frac metanephrines 83 (<205), PRA 15.35 (0.25-5.82), Aldosterone 2 ng/dl- PRA elevated likely on context of HCTZ and ACE inhibitor; salivary cortisols 0.07, 0.13- not concerning for [place].  No concerning evidence for hyperfunctionality.  06/28/14: A1c 8.3, ualb/Cr 71, Hct 44.6, Cr 1.0, APhos 109, Tchol 162, TG 270, HDL 32, LDL 76, TSH 2.08; 16/10/96: [facility] labs: Cr 1.1, A1c 9.6, nl LFTs, Tchol 119, TG 219, HDL 32, LDL 43, ualb/Cr 12.6; 04/22/16: POC A1c 7.3; 07/10/16: [facility] labs: Cr 1.5, nl LFTs, Ca 8.8, Hct 37.3; 12/23/16: A1c 6.4, Cr 1.3, Ca 9.4, nl LFTs, ualb/Cr 6; ADDENDUM: 06/08/17: A1c 5.9, Tchol 129, TG 143, HDL 40, LDL 64- has f/u on 4/5    Prior radiology: [facility] CT abd/pelvis 01/2008: Bilateral low density adrenal gland nodules are seen, largest on the right measuring 2 cm. Bilateral low density adrenal gland nodules, probable adenomas. Bilateral nonobstructing renal calculi. 05/2011: [facility] CT abd/pelvis adrenal adenomas unchanged.      [facility] 03/24/12: Compared to [facility] from 01/2008: bilateral adrenal adenomas noted with right side largest at 2 cm (unchanged from 2009) low attenation.        Assessment:       61 y.o. -year-old male with history of type 2 diabetes without known complications.        Plan:       1. Diabetes type 2 (stable): Will check hemoglobin A1c again with labs for next visit.  Based on recent A1c and unknown blood sugar pattern of elevated fasting sugars and prandial/post-prandial sugars, will adjust medication/insulin as follows: continue metformin 1000 mg bid and weekly Bydureon SC.  Check sugars AM fasting and 2 hours after biggest meal of day with goal fasting/pre-meal range of 80-130; goal postprandial range of 80-160.  Is due for eye exam; will screen for nephropathy and check spot urine microalbumin/Cr and CMP next year.  Consider seeing podiatry for foot care in the future.  Continue  carbohydrate-controlled diet.  F/U in 4-6 months to reassess.    2. Hypertension (stable): goal blood pressure is <130/80.  Borderline controlled on regimen of Lisinopril 20 mg,     3. Hyperlipidemia (stable): controlled on Zocor 20 mg; goal LDL<100.  Continue low cholesterol, low saturated fat diet.  Check fasting lipid panel with labs for next visit    4. Overweight status/Obesity (stable): counseled patient on at least 150 min/wk of moderate-intensity exercise and consider flexibility/strength training exercises if possible.  Physical activity programs should begin slowly and build up gradually; nutritional counseling status.    5. Hypoglycemia management (stable): counseled patient on need to keep sugar source with him at all times and medic-alert bracelet/indicator specifying diagnosis of diabetes    6. Adrenal adenoma (stable): given prior workup was nonrevealing,will defer on further imaging tests given nearly 5 years of surveillance; could consider repeat hormonal evaluation in future if symptoms worsen.        A total of 25 minutes were spent with the patient, of which half the time was spent counseling the patient on proper administration of medication/insulin, other drug therapy options, dietary recommendations, goals of diabetes therapy, and summarizing potential complications of diabetes.            Procedures

## 2017-01-12 NOTE — Patient Instructions (Signed)
Please note that in the future if you need prescription refills, plan ahead and call our clinic during normal clinic hours only to ensure these get done in a timely fashion.  If you are overdue for your follow up appointment, we may ask that you come in for a visit before refilling the medications.  Thanks for your understanding.    Be sure you arrive to your appointments on time in the future.  If you are late by 10 minutes or more, we may ask you reschedule because it is discourteous to patients on our schedule who do come in on time to ask them to wait because you were late.    Recommended follow-up from today's visit: 4-6 months; be sure to get your next labs 1-2 weeks before next visit      Be sure to bring your glucose meter or blood sugar log next visit for review of sugars.    Diabetes General Instructions:  Take A+ care of yourself!  Failure to "get the results" can lead to the devastating complications of diabetes.  You can help to avoid this misery by following our advice, and by controlling all aspects of your disease to the best of your ability.  I look forward to helping you improve your control.  1.  Meal Plan:   a.  Control your calorie intake to avoid gaining weight.   b.  Please eat 3 regular meals, don't skip. Eat very healthy foods, including vegetables, fruits, whole grains, nuts (in limitation), and fish.  Use olive or canola oil as your oil source.  No fruit juices or regular sodas.   c.  Sodium restriction <1500 mg daily.  2.  Exercise:   Activity:  Consider an activity you can be comfortable with such as walking, jogging, running, swimming, or weights.  Try to walk at least 5 days a week for 30 minutes each day.  Start slowly and gradually build up your stamina.  You can do this.  Find activities that you enjoy!  Be persistent and enthusiastic. Don't injure yourself and do not exercise to the point of exhaustion.  Be very careful.  Consult your MD.  3.  Daily careful shoe and foot exam.  If  you cannot see your feet clearly and entirely please either use a mirror and/or ask a relative to do the exam for you (see below)  4.  Blood Sugar Monitoring:       a. Please check your sugars 1-2 times each day.  Record your glucose values in a log.  Please bring your glucose readings or meter to each and every visit with each of your diabetes care-providers.  If you are checking your sugars twice daily you can vary the times: before breakfast and dinner one day, and before lunch and 2 hours after dinner the next day.  It is important to check your sugars 2 hours after meals to see how high the glucose readings are at this time.  If you are on short acting and long acting insulins, you need to check sugars before each meal and bedtime at the very least.       b.  Ideal blood sugar levels 80 to 130 before and 80 to 160 2hr after meals.  5.  Yearly ophthalmology exam- be sure to have your ophthalmologist forward [place] a copy of his exam report.  See your dentist every 6 months.  Get flu shot yearly.  6.  Labs: typically get comprehensive metabolic panel  and fasting lipid panel once per year (if controlled); A1c tests are done 2-4 times per year (depending on control)  7.  Continued follow-up: every 3 months (or every 6 months if excellent control).  8.  Insulin can be injected anywhere where you can pinch up fat (typically belly, inner thighs or outside of arms).  9.  Most diabetes medications (other than metformin and Actos) can cause low blood sugars.  If you experience symptoms such as shakiness, weakness, confusion, lightheadedness, or sweats, this may indicate your blood sugar is low.  In this situation, please check your blood sugar.  If it is low (<70), have a small snack such as 4 oz of orange juice, 1 cup of milk, 4-5 pieces of hard candy or 3-4 glucose tablets.  Recheck sugar in 15 minutes.  If not improved, can repeat as needed.  10.  Blood pressure surveillance and cholesterol surveillance are also important  parts of minimizing the risk for heart disease in the future.  Typically the goal blood pressure is < 130/80 and goal LDL (bad cholesterol) is <100 (for patient already with heart disease the LDL should be <70).  11.  Regarding medication for your diabetes: continue metformin 1000 mg twice daily with meals and continue Bydureon once weekly.    12.  Diabetes foot care DO's and DON'TS:  DO's:  . Wash your feet daily with warm water & soap.  . Never soak your feet without first checking the temperature of the water by hand  . Dry your feet well, especially between the toes  . If the skin on your feet is dry, apply lotion to them everyday after bathing.  If your feet sweat a large amount, apply powder to them everyday  . Check your feet daily for blisters, cuts, sores, redness or swelling.  Check carefully between your toes.  Use a mirror to check the bottom of your feet.  Notify your doctor right away if you find something wrong  . Use an emery board gently to shape toenails even with the ends of the toes  . File calluses or rough skin with an emery board to remove dead skin.  This should be done after washing the feet to help soften the skin.  . Cut your toenails straight across (to prevent ingrown toenails)  . Wear socks or stockings without seams or bumps that are clean and soft and well fitting  . Keep your feet warm and dry  . Wear shoes that fit well and do not rub toes or heels  . Examine your shoes daily for cracks, pebbles, nails or anything that could hurt your feet  . See your podiatrist (foot doctor) if you have a wound that is not healing    DON'Ts:  . Never walk barefoot indoors or outdoors  . Never use a hot water bottle or heating pad on your feet  . Don't put lotion between your toes  . Never use a knife or razor blade to cut your toenails or feet  . Don't use chemicals or corn and callous removers yourself  . Never rip off a hangnail  . Never wear garters, tight socks or other clothing which cuts off  circulation to your feet  . Don't sit with your legs crossed at the knees as this slows blood flow to your feet  . Don't smoke!

## 2017-04-15 HISTORY — PX: REPLACEMENT TOTAL KNEE: SUR1224

## 2017-06-07 NOTE — Addendum Note (Signed)
Addended by: Lucille Passy on: 06/07/2017 04:10 PM     Modules accepted: Orders

## 2017-06-07 NOTE — Addendum Note (Signed)
Addended by: Lucille Passy on: 06/07/2017 04:09 PM     Modules accepted: Orders

## 2017-06-07 NOTE — Addendum Note (Signed)
Addended byJacquenette Shone on: 06/07/2017 04:13 PM     Modules accepted: Orders

## 2017-06-08 ENCOUNTER — Other Ambulatory Visit (INDEPENDENT_AMBULATORY_CARE_PROVIDER_SITE_OTHER): Payer: Self-pay | Admitting: Endocrinology, Diabetes and Metabolism

## 2017-06-09 LAB — LIPID PANEL
Cholesterol / HDL Ratio: 3.2 (calc) (ref <5.0)
Cholesterol: 129 mg/dL (ref <200)
HDL: 40 mg/dL — ABNORMAL LOW (ref >40)
LDL Calculated: 66 mg/dL (calc)
NON HDL CHOLESTEROL: 89 mg/dL (calc) (ref <130)
Triglycerides: 143 mg/dL (ref <150)

## 2017-06-09 LAB — HEMOGLOBIN A1C: Hemoglobin A1C: 5.9 % of total Hgb — ABNORMAL HIGH (ref <5.7)

## 2017-06-11 ENCOUNTER — Encounter (INDEPENDENT_AMBULATORY_CARE_PROVIDER_SITE_OTHER): Payer: Self-pay | Admitting: Endocrinology, Diabetes and Metabolism

## 2017-06-11 ENCOUNTER — Ambulatory Visit (INDEPENDENT_AMBULATORY_CARE_PROVIDER_SITE_OTHER): Admitting: Endocrinology, Diabetes and Metabolism

## 2017-06-11 VITALS — BP 125/81 | HR 102 | Ht 74.3 in | Wt 291.6 lb

## 2017-06-11 DIAGNOSIS — IMO0001 Reserved for inherently not codable concepts without codable children: Secondary | ICD-10-CM

## 2017-06-11 DIAGNOSIS — E1165 Type 2 diabetes mellitus with hyperglycemia: Secondary | ICD-10-CM

## 2017-06-11 DIAGNOSIS — Z6837 Body mass index (BMI) 37.0-37.9, adult: Secondary | ICD-10-CM

## 2017-06-11 DIAGNOSIS — E669 Obesity, unspecified: Secondary | ICD-10-CM

## 2017-06-11 DIAGNOSIS — E785 Hyperlipidemia, unspecified: Secondary | ICD-10-CM

## 2017-06-11 DIAGNOSIS — D3500 Benign neoplasm of unspecified adrenal gland: Secondary | ICD-10-CM

## 2017-06-11 DIAGNOSIS — I10 Essential (primary) hypertension: Secondary | ICD-10-CM

## 2017-06-11 DIAGNOSIS — E11649 Type 2 diabetes mellitus with hypoglycemia without coma: Secondary | ICD-10-CM

## 2017-06-11 NOTE — Progress Notes (Addendum)
Subjective:       Patient ID: Francis Trevino is a 62 y.o. male.    HPI      Francis Trevino is following up (self-referred) for consultation regarding diabetes.    Francis Trevino is a 62 y.o. -year-old male with a history of diabetes since 2012.    In terms of severity with regards to complications, the patient does not have retinopathy, does not have nephropathy, does not have neuropathy, and does not have cardiovascular disease.     With regards to the quality of blood sugar control, the most recent hemoglobin A1c test was 5.9 in April.  The patient checks blood sugars 0-1 times per day (states home readings in good range).   Current anti-hyperglycemic regimen is comprised of metformin 1000 mg bid, weekly bydureon SC.  Other medications tried in the past include: glipizide 2.5 mg qd     In terms of associated symptoms at this time, the patient does not have polyuria, does not have blurred vision, does not have paresthesias.  With regards to gastroparesis symptoms, the patient does not have nausea and does not have diarrhea    Per the patient's history, last fundoscopic exam was 2 years ago and last podiatry exam was in 2017.  Patient had a 5 lb weight decrease in the past 5 months.    .   Currently the patient's diet comprises of 3 meals per day.  Prior formal diabetes education/ nutritional counseling has been performed.       Current frequency of hypoglycemia is rare.  Symptoms include unknown.  The patient does not have a medic alert bracelet/ID that indicates diabetes status.  The patient does not have an updated glucagon emergency kit.  The patient currently lives with wife.    Had recent knee surgery- undergoing PT.        Past Medical History:   Diagnosis Date   . Abnormal vision     wear glasses   . Arthritis    . Calcium oxalate kidney stones 05/2010    s/p lithotripsy, and "gout stone"   . Chronic gout    . Complication of anesthesia     knee scope, "hard time keeping BP up"   . Diabetes mellitus    .  History of gout    . Hyperlipidemia    . Hypertension    . Kidney calculi 05/2010    s/p lithotripsy   . Pain in Achilles tendon 2007   . Rash     scalp dryness, h/o Rosacea   . Rosacea    . Sleep apnea     uses CPAP   . Type 2 diabetes mellitus, controlled      Past Surgical History:   Procedure Laterality Date   . ACHILLES TENDON SURGERY Right 2005,2006    right x2   . CYSTOSCOPY, RETROGRADE STENT PLACEMENT Left 07/11/2016    Procedure: CYSTOSCOPY, RETROGRADE STENT PLACEMENT LEFT;  Surgeon: Prescilla Sours, MD;  Location: [facility];  Service: Urology;  Laterality: Left;   . CYSTOSCOPY, URETEROSCOPY, LASER LITHOTRIPSY Left 07/11/2016    Procedure: CYSTOSCOPY, URETEROSCOPY;  Surgeon: Prescilla Sours, MD;  Location: [facility];  Service: Urology;  Laterality: Left;   . ENDOSCOPIC SINUS SURGERY (FESS) N/A 03/31/2016    Procedure: ENDOSCOPIC SINUS SURGERY (FESS), MAXILLARY ANTROSTOMY WITH MAXILLARY TISSUE REMOVAL;  Surgeon: Jaquelyn Bitter, MD;  Location: [facility];  Service: ENT;  Laterality: N/A;  Stereolactice computer assisted (navigational)  cranial, extradural (image guidance for sinus surgery)   . ETHMOIDECTOMY N/A 03/31/2016    Procedure: ENDOSCOPIC ETHMOIDECTOMY, TOTAL ANTERIOR AND POSTERIOR;  Surgeon: Jaquelyn Bitter, MD;  Location: [facility];  Service: ENT;  Laterality: N/A;   . iris nevus  06/1991    removed   . KNEE ARTHROSCOPY  01/2008    left knee   . LITHOTRIPSY      x2   . SUSPENSION, HYOID N/A 03/31/2016    Procedure: SUSPENSION, HYOID MYOTOMY;  Surgeon: Jaquelyn Bitter, MD;  Location: [facility];  Service: ENT;  Laterality: N/A;   . TURBINOPLASTY/SMR N/A 03/31/2016    Procedure: TURBINATES RESECTIONS USING SUBMUCOSAL APPROACH, SEPTO/SMR W/WO CARTILAGE SCORING/CONTOURING Charline Bills;  Surgeon: Jaquelyn Bitter, MD;  Location: [facility];  Service: ENT;  Laterality: N/A;     Family History   Problem Relation Age of Onset   . Alzheimer's disease Mother    . Cancer Mother          skin   . Heart disease Father         x5 Bypasses   . Diabetes Father    . Cancer Father         prostate, skin   . Hypertension Brother    . Diabetes Brother    . Hyperlipidemia Neg Hx    . Kidney disease Neg Hx    . Stroke Neg Hx    . Colon cancer Neg Hx    . Malignant hyperthermia Neg Hx    . Anesthesia problems Neg Hx      Social History   Substance Use Topics   . Smoking status: Former Smoker     Packs/day: 1.50     Years: 20.00     Types: Cigarettes     Quit date: 03/20/1996   . Smokeless tobacco: Never Used   . Alcohol use Yes      Comment: Infrequent         Review of Systems   Eyes: Negative for visual disturbance.   Endocrine: Negative for polyuria.   Neurological: Negative for numbness.           Objective:    Physical Exam   Constitutional: He appears well-developed.   Obese, ambulating with cane.   Neurological:   Reflex Scores:       Achilles reflexes are 0 on the right side and 0 on the left side.  Intact monofilament, vibratory, and foot proprioception bilaterally.   Skin:   On inspection and palpation, no evidence of skin breakdown or ulceration on feet.   Psychiatric: He has a normal mood and affect. Judgment normal.         On prior exam from 04/22/16:  Obese   HENT:   Mouth/Throat: Oropharynx is clear and moist. Normal dentition.   Eyes: EOM are normal. Pupils are equal, round, and reactive to light.   Left eye iris irregular   Neck: No tracheal deviation present. No thyroid mass and no thyromegaly present.   Cardiovascular: Normal rate and regular rhythm.    No murmur heard.  Pulses:       Carotid pulses are 2+ on the right side, and 2+ on the left side.  No carotid bruits   Pulmonary/Chest: Effort normal and breath sounds normal.   Abdominal: There is no hepatosplenomegaly. There is no tenderness.   Neurological:   Reflex Scores:       Achilles reflexes are 0 on the right side  and 0 on the left side.  Foot exam: intact monofilament (except right big toe diminished), vibratory and foot  proprioception bilaterally   Skin:   On inspection and palpation, no skin breakdown, ulceration, corns or calluses.  Dry skin on big toes   Psychiatric: He has a normal mood and affect. Judgment normal.         Prior labs: 02/26/12: A1c 6.7, Tchol 116, TG 68, HDL 33, LDL 49, Cr 1.23, nl LFTs, Ca 9.1, Na 139, K 4.1; 03/28/12: 8 AM: plasma frac metanephrines 83 (<205), PRA 15.35 (0.25-5.82), Aldosterone 2 ng/dl- PRA elevated likely on context of HCTZ and ACE inhibitor; salivary cortisols 0.07, 0.13- not concerning for [place].  No concerning evidence for hyperfunctionality.  06/28/14: A1c 8.3, ualb/Cr 71, Hct 44.6, Cr 1.0, APhos 109, Tchol 162, TG 270, HDL 32, LDL 76, TSH 2.08; 16/10/96: [facility] labs: Cr 1.1, A1c 9.6, nl LFTs, Tchol 119, TG 219, HDL 32, LDL 43, ualb/Cr 12.6; 04/22/16: POC A1c 7.3; 07/10/16: [facility] labs: Cr 1.5, nl LFTs, Ca 8.8, Hct 37.3; 12/23/16: A1c 6.4, Cr 1.3, Ca 9.4, nl LFTs, ualb/Cr 6; 06/08/17: A1c 5.9, Tchol 129, TG 143, HDL 40, LDL 64; ADDENDUM: 06/12/38: A1c 7.4, ualb/Cr 3, Cr 1.24, Ca 9, nl LFTs- has f/u on 10/25    Prior radiology: [facility] CT abd/pelvis 01/2008: Bilateral low density adrenal gland nodules are seen, largest on the right measuring 2 cm. Bilateral low density adrenal gland nodules, probable adenomas. Bilateral nonobstructing renal calculi. 05/2011: [facility] CT abd/pelvis adrenal adenomas unchanged.      [facility] 03/24/12: Compared to [place] imaging from 01/2008: bilateral adrenal adenomas noted with right side largest at 2 cm (unchanged from 2009) low attenation.        Assessment:       62 y.o. -year-old male with history of type 2 diabetes without known complications.        Plan:       1. Diabetes type 2 (stable): Will check hemoglobin A1c again with labs for next visit.  Based on recent A1c and unknown blood sugar pattern of fasting sugars and prandial/post-prandial sugars, will adjust medication/insulin as follows: continue metformin 1000 mg bid and weekly Bydureon  SC but offered option to try less metformin and watch the sugars.  Check sugars AM fasting and 2 hours after biggest meal of day with goal fasting/pre-meal range of 80-130; goal postprandial range of 80-160.  Is due for eye exam; will screen for nephropathy and check spot urine microalbumin/Cr and CMP with labs for next visit.  Consider seeing podiatry for foot care in the future.  Continue carbohydrate-controlled diet.  F/U in 4-6 months to reassess.    2. Hypertension (stable): goal blood pressure is <130/80.  Controlled on regimen of Lisinopril 20 mg, HCTZ 25 mg daily    3. Hyperlipidemia (stable): controlled on Zocor 20 mg; goal LDL<100.  Continue low cholesterol, low saturated fat diet.      4. Overweight status/Obesity (stable): counseled patient on at least 150 min/wk of moderate-intensity exercise and consider flexibility/strength training exercises if possible.  Physical activity programs should begin slowly and build up gradually; nutritional counseling status.    5. Hypoglycemia management (stable): counseled patient on need to keep sugar source with him at all times and medic-alert bracelet/indicator specifying diagnosis of diabetes    6. Adrenal adenoma (stable): given prior workup was nonrevealing,will defer on further imaging tests given nearly 5 years of surveillance; could consider repeat hormonal evaluation in  future if symptoms worsen.        A total of 25 minutes were spent with the patient, of which half the time was spent counseling the patient on proper administration of medication/insulin, other drug therapy options, dietary recommendations, goals of diabetes therapy, and summarizing potential complications of diabetes.              Procedures

## 2017-06-11 NOTE — Patient Instructions (Signed)
Please note that in the future if you need prescription refills, plan ahead and call our clinic during normal clinic hours only to ensure these get done in a timely fashion.  If you are overdue for your follow up appointment, we may ask that you come in for a visit before refilling the medications.  Thanks for your understanding.    Be sure you arrive to your appointments on time in the future.  If you are late by 10 minutes or more, we may ask you reschedule because it is discourteous to patients on our schedule who do come in on time to ask them to wait because you were late.    Recommended follow-up from today's visit: 4-6 months; be sure to get your next labs 1-2 weeks before next visit      Be sure to bring your glucose meter or blood sugar log next visit for review of sugars.    Diabetes General Instructions:  Take A+ care of yourself!  Failure to "get the results" can lead to the devastating complications of diabetes.  You can help to avoid this misery by following our advice, and by controlling all aspects of your disease to the best of your ability.  I look forward to helping you improve your control.  1.  Meal Plan:   a.  Control your calorie intake to avoid gaining weight.   b.  Please eat 3 regular meals, don't skip. Eat very healthy foods, including vegetables, fruits, whole grains, nuts (in limitation), and fish.  Use olive or canola oil as your oil source.  No fruit juices or regular sodas.   c.  Sodium restriction <1500 mg daily.  2.  Exercise:   Activity:  Consider an activity you can be comfortable with such as walking, jogging, running, swimming, or weights.  Try to walk at least 5 days a week for 30 minutes each day.  Start slowly and gradually build up your stamina.  You can do this.  Find activities that you enjoy!  Be persistent and enthusiastic. Don't injure yourself and do not exercise to the point of exhaustion.  Be very careful.  Consult your MD.  3.  Daily careful shoe and foot exam.  If  you cannot see your feet clearly and entirely please either use a mirror and/or ask a relative to do the exam for you (see below)  4.  Blood Sugar Monitoring:       a. Please check your sugars 1-2 times each day.  Record your glucose values in a log.  Please bring your glucose readings or meter to each and every visit with each of your diabetes care-providers.  If you are checking your sugars twice daily you can vary the times: before breakfast and dinner one day, and before lunch and 2 hours after dinner the next day.  It is important to check your sugars 2 hours after meals to see how high the glucose readings are at this time.  If you are on short acting and long acting insulins, you need to check sugars before each meal and bedtime at the very least.       b.  Ideal blood sugar levels 80 to 130 before and 80 to 160 2hr after meals.  5.  Yearly ophthalmology exam- be sure to have your ophthalmologist forward [place] a copy of his exam report.  See your dentist every 6 months.  Get flu shot yearly.  6.  Labs: typically get comprehensive metabolic panel  and fasting lipid panel once per year (if controlled); A1c tests are done 2-4 times per year (depending on control)  7.  Continued follow-up: every 3 months (or every 6 months if excellent control).  8.  Insulin can be injected anywhere where you can pinch up fat (typically belly, inner thighs or outside of arms).  9.  Most diabetes medications (other than metformin and Actos) can cause low blood sugars.  If you experience symptoms such as shakiness, weakness, confusion, lightheadedness, or sweats, this may indicate your blood sugar is low.  In this situation, please check your blood sugar.  If it is low (<70), have a small snack such as 4 oz of orange juice, 1 cup of milk, 4-5 pieces of hard candy or 3-4 glucose tablets.  Recheck sugar in 15 minutes.  If not improved, can repeat as needed.  10.  Blood pressure surveillance and cholesterol surveillance are also important  parts of minimizing the risk for heart disease in the future.  Typically the goal blood pressure is < 130/80 and goal LDL (bad cholesterol) is <100 (for patient already with heart disease the LDL should be <70).  11.  Regarding medication for your diabetes: continue metformin 1000 mg twice daily with meals and continue Bydureon once weekly.  Consider lowering metformin but watch sugars.  12.  Diabetes foot care DO's and DON'TS:  DO's:  . Wash your feet daily with warm water & soap.  . Never soak your feet without first checking the temperature of the water by hand  . Dry your feet well, especially between the toes  . If the skin on your feet is dry, apply lotion to them everyday after bathing.  If your feet sweat a large amount, apply powder to them everyday  . Check your feet daily for blisters, cuts, sores, redness or swelling.  Check carefully between your toes.  Use a mirror to check the bottom of your feet.  Notify your doctor right away if you find something wrong  . Use an emery board gently to shape toenails even with the ends of the toes  . File calluses or rough skin with an emery board to remove dead skin.  This should be done after washing the feet to help soften the skin.  . Cut your toenails straight across (to prevent ingrown toenails)  . Wear socks or stockings without seams or bumps that are clean and soft and well fitting  . Keep your feet warm and dry  . Wear shoes that fit well and do not rub toes or heels  . Examine your shoes daily for cracks, pebbles, nails or anything that could hurt your feet  . See your podiatrist (foot doctor) if you have a wound that is not healing    DON'Ts:  . Never walk barefoot indoors or outdoors  . Never use a hot water bottle or heating pad on your feet  . Don't put lotion between your toes  . Never use a knife or razor blade to cut your toenails or feet  . Don't use chemicals or corn and callous removers yourself  . Never rip off a hangnail  . Never wear garters,  tight socks or other clothing which cuts off circulation to your feet  . Don't sit with your legs crossed at the knees as this slows blood flow to your feet  . Don't smoke!

## 2017-07-12 ENCOUNTER — Telehealth (INDEPENDENT_AMBULATORY_CARE_PROVIDER_SITE_OTHER): Payer: Self-pay | Admitting: Endocrinology, Diabetes and Metabolism

## 2017-07-12 NOTE — Telephone Encounter (Signed)
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

## 2017-07-12 NOTE — Telephone Encounter (Signed)
Will let Dr. Macon Large address this next week , please inform patient

## 2017-07-13 ENCOUNTER — Other Ambulatory Visit (INDEPENDENT_AMBULATORY_CARE_PROVIDER_SITE_OTHER): Payer: Self-pay | Admitting: Endocrinology, Diabetes and Metabolism

## 2017-07-13 DIAGNOSIS — IMO0001 Reserved for inherently not codable concepts without codable children: Secondary | ICD-10-CM

## 2017-07-13 MED ORDER — EXENATIDE ER 2 MG/0.85ML SC AUIJ
2.00 mg | AUTO-INJECTOR | SUBCUTANEOUS | 0 refills | Status: DC
Start: 2017-07-13 — End: 2017-07-21

## 2017-07-13 NOTE — Telephone Encounter (Signed)
Rx refill request, last seen was 06/11/17, next apt is 12/15/17. Patient asked for a hard script.

## 2017-07-21 ENCOUNTER — Telehealth (INDEPENDENT_AMBULATORY_CARE_PROVIDER_SITE_OTHER): Payer: Self-pay

## 2017-07-21 ENCOUNTER — Encounter (INDEPENDENT_AMBULATORY_CARE_PROVIDER_SITE_OTHER): Payer: Self-pay | Admitting: Endocrinology, Diabetes and Metabolism

## 2017-07-21 MED ORDER — EXENATIDE ER 2 MG/0.85ML SC AUIJ
2.00 mg | AUTO-INJECTOR | SUBCUTANEOUS | 0 refills | Status: DC
Start: 2017-07-21 — End: 2017-12-16

## 2017-07-21 MED ORDER — EXENATIDE ER 2 MG/0.85ML SC AUIJ
2.00 mg | AUTO-INJECTOR | SUBCUTANEOUS | 4 refills | Status: DC
Start: 2017-07-21 — End: 2017-12-16

## 2017-07-21 NOTE — Telephone Encounter (Signed)
Mr. Moxey requesting a DOT letter and a written Rx for Bydureon. He would like to pick them up today.    He was last seen/labs in March 2019.

## 2017-07-21 NOTE — Addendum Note (Signed)
Addended byLavonna Rua on: 07/21/2017 09:53 AM     Modules accepted: Orders

## 2017-07-21 NOTE — Telephone Encounter (Signed)
Printed Rx for [employer] in epic.  The DOT letter may have to come from his PCP.

## 2017-08-09 NOTE — Telephone Encounter (Signed)
Mailbox is full.

## 2017-12-10 ENCOUNTER — Other Ambulatory Visit (INDEPENDENT_AMBULATORY_CARE_PROVIDER_SITE_OTHER): Payer: Self-pay | Admitting: Endocrinology, Diabetes and Metabolism

## 2017-12-13 LAB — COMPREHENSIVE METABOLIC PANEL
ALT: 14 U/L (ref 9–46)
AST (SGOT): 12 U/L (ref 10–35)
Albumin/Globulin Ratio: 2 (calc) (ref 1.0–2.5)
Albumin: 4.1 g/dL (ref 3.6–5.1)
Alkaline Phosphatase: 85 U/L (ref 40–115)
BUN: 21 mg/dL (ref 7–25)
Bilirubin, Total: 0.4 mg/dL (ref 0.2–1.2)
CO2: 24 mmol/L (ref 20–32)
Calcium: 9 mg/dL (ref 8.6–10.3)
Chloride: 104 mmol/L (ref 98–110)
Creatinine: 1.24 mg/dL (ref 0.70–1.25)
EGFR African American: 73 mL/min/{1.73_m2} (ref >=60)
Globulin: 2.1 g/dL (calc) (ref 1.9–3.7)
Glucose: 207 mg/dL — ABNORMAL HIGH (ref 65–99)
NON-AFRICAN AMERICA EGFR: 63 mL/min/{1.73_m2} (ref >=60)
Potassium: 4.1 mmol/L (ref 3.5–5.3)
Protein, Total: 6.2 g/dL (ref 6.1–8.1)
Sodium: 139 mmol/L (ref 135–146)

## 2017-12-13 LAB — MICROALBUMIN, RANDOM URINE
Microalbumin/Creatinine Ratio: 3 mcg/mg creat (ref <30)
Microalbumin: 0.3 mg/dL
Urine Creatinine, Random: 100 mg/dL (ref 20–320)

## 2017-12-13 LAB — HEMOGLOBIN A1C: Hemoglobin A1C: 7.4 % of total Hgb — ABNORMAL HIGH (ref <5.7)

## 2017-12-15 ENCOUNTER — Ambulatory Visit (INDEPENDENT_AMBULATORY_CARE_PROVIDER_SITE_OTHER): Admitting: Endocrinology, Diabetes and Metabolism

## 2017-12-15 DIAGNOSIS — L570 Actinic keratosis: Secondary | ICD-10-CM | POA: Insufficient documentation

## 2017-12-15 DIAGNOSIS — D3141 Benign neoplasm of right ciliary body: Secondary | ICD-10-CM | POA: Insufficient documentation

## 2017-12-15 DIAGNOSIS — L719 Rosacea, unspecified: Secondary | ICD-10-CM | POA: Insufficient documentation

## 2017-12-15 DIAGNOSIS — L814 Other melanin hyperpigmentation: Secondary | ICD-10-CM | POA: Insufficient documentation

## 2017-12-16 ENCOUNTER — Ambulatory Visit (INDEPENDENT_AMBULATORY_CARE_PROVIDER_SITE_OTHER): Admitting: Endocrinology, Diabetes and Metabolism

## 2017-12-16 ENCOUNTER — Encounter (INDEPENDENT_AMBULATORY_CARE_PROVIDER_SITE_OTHER): Payer: Self-pay | Admitting: Endocrinology, Diabetes and Metabolism

## 2017-12-16 VITALS — BP 116/76 | HR 88 | Ht 73.5 in | Wt 292.0 lb

## 2017-12-16 DIAGNOSIS — E1165 Type 2 diabetes mellitus with hyperglycemia: Secondary | ICD-10-CM

## 2017-12-16 DIAGNOSIS — IMO0001 Reserved for inherently not codable concepts without codable children: Secondary | ICD-10-CM

## 2017-12-16 MED ORDER — FREESTYLE LANCETS MISC
3 refills | Status: AC
Start: 2017-12-16 — End: ?

## 2017-12-16 MED ORDER — METFORMIN HCL ER 500 MG PO TB24
500.00 mg | ORAL_TABLET | Freq: Every morning | ORAL | 1 refills | Status: DC
Start: 2017-12-16 — End: 2018-06-15

## 2017-12-16 MED ORDER — EXENATIDE ER 2 MG/0.85ML SC AUIJ
2.00 mg | AUTO-INJECTOR | SUBCUTANEOUS | 1 refills | Status: DC
Start: 2017-12-16 — End: 2018-06-15

## 2017-12-16 MED ORDER — FREESTYLE LITE TEST VI STRP
ORAL_STRIP | 3 refills | Status: DC
Start: 2017-12-16 — End: 2018-10-06

## 2017-12-16 NOTE — Progress Notes (Signed)
Subjective:       Patient ID: Francis Trevino is a 62 y.o. male.    HPI      [facility] is following up (self-referred) for consultation regarding diabetes.    Francis Trevino is a 62 y.o. -year-old male with a history of diabetes since 2012.    In terms of severity with regards to complications, the patient does not have retinopathy, does not have nephropathy, does not have neuropathy, and does not have cardiovascular disease.     With regards to the quality of blood sugar control, the most recent hemoglobin A1c test was 7.4.  The patient checks blood sugars 0-1 times per day (states home readings in good range).   Current anti-hyperglycemic regimen is comprised of metformin 500 mg bid, weekly bydureon SC.  Other medications tried in the past include: glipizide 2.5 mg qd     In terms of associated symptoms at this time, the patient does not have polyuria, does not have blurred vision, does not have paresthesias.  With regards to gastroparesis symptoms, the patient does not have nausea and does not have diarrhea    Per the patient's history, last fundoscopic exam was July and last podiatry exam was in 2017.  Patient had a 0 lb weight decrease in the past 5 months.    .   Currently the patient's diet comprises of 3 meals per day.  Prior formal diabetes education/ nutritional counseling has been performed.       Current frequency of hypoglycemia is rare.  Symptoms include unknown.  The patient does not have a medic alert bracelet/ID that indicates diabetes status.  The patient does not have an updated glucagon emergency kit.  The patient currently lives with wife.    Had recent knee surgery- undergoing PT.  Had left lateral lower leg numbness in context of nerve injury intra-operatively.        Past Medical History:   Diagnosis Date   . Abnormal vision     wear glasses   . Arthritis    . Calcium oxalate kidney stones 05/2010    s/p lithotripsy, and "gout stone"   . Chronic gout    . Complication of anesthesia      knee scope, "hard time keeping BP up"   . Diabetes mellitus    . History of gout    . Hyperlipidemia    . Hypertension    . Kidney calculi 05/2010    s/p lithotripsy   . Pain in Achilles tendon 2007   . Rash     scalp dryness, h/o Rosacea   . Rosacea    . Sleep apnea     uses CPAP   . Type 2 diabetes mellitus, controlled      Past Surgical History:   Procedure Laterality Date   . ACHILLES TENDON SURGERY Right 2005,2006    right x2   . CYSTOSCOPY, RETROGRADE STENT PLACEMENT Left 07/11/2016    Procedure: CYSTOSCOPY, RETROGRADE STENT PLACEMENT LEFT;  Surgeon: Prescilla Sours, MD;  Location: [facility];  Service: Urology;  Laterality: Left;   . CYSTOSCOPY, URETEROSCOPY, LASER LITHOTRIPSY Left 07/11/2016    Procedure: CYSTOSCOPY, URETEROSCOPY;  Surgeon: Prescilla Sours, MD;  Location: [facility];  Service: Urology;  Laterality: Left;   . ENDOSCOPIC SINUS SURGERY (FESS) N/A 03/31/2016    Procedure: ENDOSCOPIC SINUS SURGERY (FESS), MAXILLARY ANTROSTOMY WITH MAXILLARY TISSUE REMOVAL;  Surgeon: Jaquelyn Bitter, MD;  Location: [facility];  Service:  ENT;  Laterality: N/A;  Stereolactice computer assisted (navigational) cranial, extradural (image guidance for sinus surgery)   . ETHMOIDECTOMY N/A 03/31/2016    Procedure: ENDOSCOPIC ETHMOIDECTOMY, TOTAL ANTERIOR AND POSTERIOR;  Surgeon: Jaquelyn Bitter, MD;  Location: [facility];  Service: ENT;  Laterality: N/A;   . iris nevus  06/1991    removed   . KNEE ARTHROSCOPY  01/2008    left knee   . LITHOTRIPSY      x2   . REPLACEMENT TOTAL KNEE Left 04/15/2017   . SUSPENSION, HYOID N/A 03/31/2016    Procedure: SUSPENSION, HYOID MYOTOMY;  Surgeon: Jaquelyn Bitter, MD;  Location: [facility];  Service: ENT;  Laterality: N/A;   . TURBINOPLASTY/SMR N/A 03/31/2016    Procedure: TURBINATES RESECTIONS USING SUBMUCOSAL APPROACH, SEPTO/SMR W/WO CARTILAGE SCORING/CONTOURING Charline Bills;  Surgeon: Jaquelyn Bitter, MD;  Location: [facility];  Service: ENT;  Laterality:  N/A;     Family History   Problem Relation Age of Onset   . Alzheimer's disease Mother    . Cancer Mother         skin   . Heart disease Father         x5 Bypasses   . Diabetes Father    . Cancer Father         prostate, skin   . Hypertension Brother    . Diabetes Brother    . Hyperlipidemia Neg Hx    . Kidney disease Neg Hx    . Stroke Neg Hx    . Colon cancer Neg Hx    . Malignant hyperthermia Neg Hx    . Anesthesia problems Neg Hx      Social History     Tobacco Use   . Smoking status: Former Smoker     Packs/day: 1.50     Years: 20.00     Pack years: 30.00     Types: Cigarettes     Last attempt to quit: 03/20/1996     Years since quitting: 21.7   . Smokeless tobacco: Never Used   Substance Use Topics   . Alcohol use: Yes     Comment: Infrequent         Review of Systems   Eyes: Negative for visual disturbance.   Endocrine: Negative for polyuria.   Neurological: Negative for numbness.           Objective:    Physical Exam  Constitutional:       Appearance: He is well-developed.      Comments: Obese   Skin:     Comments: On inspection and palpation, no evidence of skin breakdown or ulceration on feet.   Neurological:      Deep Tendon Reflexes:      Reflex Scores:       Achilles reflexes are 0 on the right side and 0 on the left side.     Comments: Intact monofilament, vibratory, and foot proprioception bilaterally.   Psychiatric:         Mood and Affect: Mood normal.         Judgment: Judgment normal.         On prior exam from 06/11/17:  Obese, ambulating with cane.   Neurological:   Reflex Scores:       Achilles reflexes are 0 on the right side and 0 on the left side.  Intact monofilament, vibratory, and foot proprioception bilaterally.   Skin:   On inspection and palpation, no evidence  of skin breakdown or ulceration on feet.   Psychiatric: He has a normal mood and affect. Judgment normal.         On prior exam from 04/22/16:  Obese   HENT:   Mouth/Throat: Oropharynx is clear and moist. Normal dentition.   Eyes:  EOM are normal. Pupils are equal, round, and reactive to light.   Left eye iris irregular   Neck: No tracheal deviation present. No thyroid mass and no thyromegaly present.   Cardiovascular: Normal rate and regular rhythm.    No murmur heard.  Pulses:       Carotid pulses are 2+ on the right side, and 2+ on the left side.  No carotid bruits   Pulmonary/Chest: Effort normal and breath sounds normal.   Abdominal: There is no hepatosplenomegaly. There is no tenderness.   Neurological:   Reflex Scores:       Achilles reflexes are 0 on the right side and 0 on the left side.  Foot exam: intact monofilament (except right big toe diminished), vibratory and foot proprioception bilaterally   Skin:   On inspection and palpation, no skin breakdown, ulceration, corns or calluses.  Dry skin on big toes   Psychiatric: He has a normal mood and affect. Judgment normal.         Prior labs: 02/26/12: A1c 6.7, Tchol 116, TG 68, HDL 33, LDL 49, Cr 1.23, nl LFTs, Ca 9.1, Na 139, K 4.1; 03/28/12: 8 AM: plasma frac metanephrines 83 (<205), PRA 15.35 (0.25-5.82), Aldosterone 2 ng/dl- PRA elevated likely on context of HCTZ and ACE inhibitor; salivary cortisols 0.07, 0.13- not concerning for [place].  No concerning evidence for hyperfunctionality.  06/28/14: A1c 8.3, ualb/Cr 71, Hct 44.6, Cr 1.0, APhos 109, Tchol 162, TG 270, HDL 32, LDL 76, TSH 2.08; 16/10/96: [facility] labs: Cr 1.1, A1c 9.6, nl LFTs, Tchol 119, TG 219, HDL 32, LDL 43, ualb/Cr 12.6; 04/22/16: POC A1c 7.3; 07/10/16: [facility] labs: Cr 1.5, nl LFTs, Ca 8.8, Hct 37.3; 12/23/16: A1c 6.4, Cr 1.3, Ca 9.4, nl LFTs, ualb/Cr 6; 06/08/17: A1c 5.9, Tchol 129, TG 143, HDL 40, LDL 64; 12/13/17: A1c 7.4, ualb/Cr 3, Cr 1.24, Ca 9, nl LFTs    Prior radiology: [facility] CT abd/pelvis 01/2008: Bilateral low density adrenal gland nodules are seen, largest on the right measuring 2 cm. Bilateral low density adrenal gland nodules, probable adenomas. Bilateral nonobstructing renal calculi. 05/2011:  [place] CT abd/pelvis adrenal adenomas unchanged.      [facility] 03/24/12: Compared to [facility] imaging from 01/2008: bilateral adrenal adenomas noted with right side largest at 2 cm (unchanged from 2009) low attenation.        Assessment:       62 y.o. -year-old male with history of type 2 diabetes without known complications.        Plan:       1. Diabetes type 2 (stable): Will check hemoglobin A1c again with labs for next visit.  Based on recent A1c and unknown blood sugar pattern of fasting sugars and prandial/post-prandial sugars, will adjust medication/insulin as follows: continue metformin 500 mg bid and weekly Bydureon SC.  Check sugars AM fasting and 2 hours after biggest meal of day with goal fasting/pre-meal range of 80-130; goal postprandial range of 80-160.  Is due for eye exam next summer; will screen for nephropathy and check spot urine microalbumin/Cr and CMP with labs for next visit.  Consider seeing podiatry for foot care in the future.  Continue carbohydrate-controlled  diet.  F/U in 4-6 months to reassess.    2. Hypertension (stable): goal blood pressure is <130/80.  Controlled on regimen of Lisinopril 20 mg, HCTZ 25 mg daily    3. Hyperlipidemia (stable): controlled on Zocor 20 mg; goal LDL<100.  Continue low cholesterol, low saturated fat diet.  Check fasting lipid panel with labs for next visit.    4. Overweight status/Obesity (stable): counseled patient on at least 150 min/wk of moderate-intensity exercise and consider flexibility/strength training exercises if possible.  Physical activity programs should begin slowly and build up gradually; nutritional counseling status.    5. Hypoglycemia management (stable): counseled patient on need to keep sugar source with him at all times and medic-alert bracelet/indicator specifying diagnosis of diabetes    6. Adrenal adenoma (stable): given prior workup was nonrevealing,will defer on further imaging tests given nearly 5 years of surveillance; could  consider repeat hormonal evaluation in future if symptoms worsen.        A total of 25 minutes were spent with the patient, of which half the time was spent counseling the patient on proper administration of medication/insulin, other drug therapy options, dietary recommendations, goals of diabetes therapy, and summarizing potential complications of diabetes.                  Procedures

## 2017-12-16 NOTE — Patient Instructions (Signed)
Please note that in the future if you need prescription refills, plan ahead and call our clinic during normal clinic hours only to ensure these get done in a timely fashion.  If you are overdue for your follow up appointment, we may ask that you come in for a visit before refilling the medications.  Thanks for your understanding.    Be sure you arrive to your appointments on time in the future.  If you are late by 10 minutes or more, we may ask you reschedule because it is discourteous to patients on our schedule who do come in on time to ask them to wait because you were late.    Recommended follow-up from today's visit: 4-6 months; be sure to get your next labs 1-2 weeks before next visit      Be sure to bring your glucose meter or blood sugar log next visit for review of sugars.    Diabetes General Instructions:  Take A+ care of yourself!  Failure to "get the results" can lead to the devastating complications of diabetes.  You can help to avoid this misery by following our advice, and by controlling all aspects of your disease to the best of your ability.  I look forward to helping you improve your control.  1.  Meal Plan:   a.  Control your calorie intake to avoid gaining weight.   b.  Please eat 3 regular meals, don't skip. Eat very healthy foods, including vegetables, fruits, whole grains, nuts (in limitation), and fish.  Use olive or canola oil as your oil source.  No fruit juices or regular sodas.   c.  Sodium restriction <1500 mg daily.  2.  Exercise:   Activity:  Consider an activity you can be comfortable with such as walking, jogging, running, swimming, or weights.  Try to walk at least 5 days a week for 30 minutes each day.  Start slowly and gradually build up your stamina.  You can do this.  Find activities that you enjoy!  Be persistent and enthusiastic. Don't injure yourself and do not exercise to the point of exhaustion.  Be very careful.  Consult your MD.  3.  Daily careful shoe and foot exam.  If  you cannot see your feet clearly and entirely please either use a mirror and/or ask a relative to do the exam for you (see below)  4.  Blood Sugar Monitoring:       a. Please check your sugars 1-2 times each day.  Record your glucose values in a log.  Please bring your glucose readings or meter to each and every visit with each of your diabetes care-providers.  If you are checking your sugars twice daily you can vary the times: before breakfast and dinner one day, and before lunch and 2 hours after dinner the next day.  It is important to check your sugars 2 hours after meals to see how high the glucose readings are at this time.  If you are on short acting and long acting insulins, you need to check sugars before each meal and bedtime at the very least.       b.  Ideal blood sugar levels 80 to 130 before and 80 to 160 2hr after meals.  5.  Yearly ophthalmology exam- be sure to have your ophthalmologist forward [place] a copy of his exam report.  See your dentist every 6 months.  Get flu shot yearly.  6.  Labs: typically get comprehensive metabolic panel  and fasting lipid panel once per year (if controlled); A1c tests are done 2-4 times per year (depending on control)  7.  Continued follow-up: every 3 months (or every 6 months if excellent control).  8.  Insulin can be injected anywhere where you can pinch up fat (typically belly, inner thighs or outside of arms).  9.  Most diabetes medications (other than metformin and Actos) can cause low blood sugars.  If you experience symptoms such as shakiness, weakness, confusion, lightheadedness, or sweats, this may indicate your blood sugar is low.  In this situation, please check your blood sugar.  If it is low (<70), have a small snack such as 4 oz of orange juice, 1 cup of milk, 4-5 pieces of hard candy or 3-4 glucose tablets.  Recheck sugar in 15 minutes.  If not improved, can repeat as needed.  10.  Blood pressure surveillance and cholesterol surveillance are also important  parts of minimizing the risk for heart disease in the future.  Typically the goal blood pressure is < 130/80 and goal LDL (bad cholesterol) is <100 (for patient already with heart disease the LDL should be <70).  11.  Regarding medication for your diabetes: continue metformin 500 mg twice daily with meals and continue Bydureon once weekly.    12.  Diabetes foot care DO's and DON'TS:  DO's:  . Wash your feet daily with warm water & soap.  . Never soak your feet without first checking the temperature of the water by hand  . Dry your feet well, especially between the toes  . If the skin on your feet is dry, apply lotion to them everyday after bathing.  If your feet sweat a large amount, apply powder to them everyday  . Check your feet daily for blisters, cuts, sores, redness or swelling.  Check carefully between your toes.  Use a mirror to check the bottom of your feet.  Notify your doctor right away if you find something wrong  . Use an emery board gently to shape toenails even with the ends of the toes  . File calluses or rough skin with an emery board to remove dead skin.  This should be done after washing the feet to help soften the skin.  . Cut your toenails straight across (to prevent ingrown toenails)  . Wear socks or stockings without seams or bumps that are clean and soft and well fitting  . Keep your feet warm and dry  . Wear shoes that fit well and do not rub toes or heels  . Examine your shoes daily for cracks, pebbles, nails or anything that could hurt your feet  . See your podiatrist (foot doctor) if you have a wound that is not healing    DON'Ts:  . Never walk barefoot indoors or outdoors  . Never use a hot water bottle or heating pad on your feet  . Don't put lotion between your toes  . Never use a knife or razor blade to cut your toenails or feet  . Don't use chemicals or corn and callous removers yourself  . Never rip off a hangnail  . Never wear garters, tight socks or other clothing which cuts off  circulation to your feet  . Don't sit with your legs crossed at the knees as this slows blood flow to your feet  . Don't smoke!

## 2017-12-31 ENCOUNTER — Ambulatory Visit (INDEPENDENT_AMBULATORY_CARE_PROVIDER_SITE_OTHER): Admitting: Endocrinology, Diabetes and Metabolism

## 2018-01-12 DIAGNOSIS — I7789 Other specified disorders of arteries and arterioles: Secondary | ICD-10-CM | POA: Insufficient documentation

## 2018-01-12 DIAGNOSIS — E790 Hyperuricemia without signs of inflammatory arthritis and tophaceous disease: Secondary | ICD-10-CM | POA: Insufficient documentation

## 2018-01-12 DIAGNOSIS — I5189 Other ill-defined heart diseases: Secondary | ICD-10-CM | POA: Insufficient documentation

## 2018-01-12 DIAGNOSIS — G4733 Obstructive sleep apnea (adult) (pediatric): Secondary | ICD-10-CM | POA: Insufficient documentation

## 2018-01-12 DIAGNOSIS — E785 Hyperlipidemia, unspecified: Secondary | ICD-10-CM | POA: Insufficient documentation

## 2018-01-12 DIAGNOSIS — D3500 Benign neoplasm of unspecified adrenal gland: Secondary | ICD-10-CM | POA: Insufficient documentation

## 2018-01-12 DIAGNOSIS — R161 Splenomegaly, not elsewhere classified: Secondary | ICD-10-CM | POA: Insufficient documentation

## 2018-03-18 ENCOUNTER — Encounter (INDEPENDENT_AMBULATORY_CARE_PROVIDER_SITE_OTHER): Payer: Self-pay | Admitting: Endocrinology, Diabetes and Metabolism

## 2018-03-18 DIAGNOSIS — IMO0001 Reserved for inherently not codable concepts without codable children: Secondary | ICD-10-CM

## 2018-05-31 ENCOUNTER — Encounter (INDEPENDENT_AMBULATORY_CARE_PROVIDER_SITE_OTHER): Payer: Self-pay | Admitting: Endocrinology, Diabetes and Metabolism

## 2018-05-31 ENCOUNTER — Other Ambulatory Visit (INDEPENDENT_AMBULATORY_CARE_PROVIDER_SITE_OTHER): Payer: Self-pay | Admitting: Endocrinology, Diabetes and Metabolism

## 2018-05-31 ENCOUNTER — Telehealth (INDEPENDENT_AMBULATORY_CARE_PROVIDER_SITE_OTHER): Payer: Self-pay

## 2018-05-31 DIAGNOSIS — IMO0001 Reserved for inherently not codable concepts without codable children: Secondary | ICD-10-CM

## 2018-05-31 NOTE — Telephone Encounter (Signed)
Patient will be going to [facility] to have his labs done. Please place new orders for [facility] and mail them home to him.

## 2018-05-31 NOTE — Telephone Encounter (Signed)
New lab orders for [facility] placed in epic

## 2018-06-14 ENCOUNTER — Encounter (INDEPENDENT_AMBULATORY_CARE_PROVIDER_SITE_OTHER): Payer: Self-pay

## 2018-06-14 NOTE — Progress Notes (Addendum)
Subjective:       Patient ID: Francis Trevino is a 63 y.o. male.    HPI      Francis Trevino is following up (self-referred) for consultation regarding diabetes.    Francis Trevino is a 63 y.o. -year-old male with a history of diabetes since 2012.    In terms of severity with regards to complications, the patient does not have retinopathy, does not have nephropathy, does not have neuropathy, and does not have cardiovascular disease.     With regards to the quality of blood sugar control, the most recent hemoglobin A1c test was 7.4.  The patient checks blood sugars 0-1 times per day (is not checking much).   Current anti-hyperglycemic regimen is comprised of metformin 500 mg qd, weekly bydureon SC.  Other medications tried in the past include: glipizide 2.5 mg qd     In terms of associated symptoms at this time, the patient does not have polyuria, does not have blurred vision, does not have paresthesias.  With regards to gastroparesis symptoms, the patient does not have nausea and does not have diarrhea    Per the patients history, last fundoscopic exam was July and last podiatry exam was in 2017.  Patient had a 0 lb weight decrease in the past 5 months.    .   Currently the patients diet comprises of 3 meals per day.  Prior formal diabetes education/ nutritional counseling has been performed.       Current frequency of hypoglycemia is rare.  Symptoms include unknown.  The patient does not have a medic alert bracelet/ID that indicates diabetes status.  The patient does not have an updated glucagon emergency kit.  The patient currently lives with wife.    Had recent knee surgery- undergoing PT.  Had left lateral lower leg numbness in context of nerve injury intra-operatively.  Had recent bout of chest pains and sinus infection.  States cardiac workup was negative but had missed some blood pressure medications around that time.        Past Medical History:   Diagnosis Date    Abnormal vision     wear glasses     Arthritis     Calcium oxalate kidney stones 05/2010    s/p lithotripsy, and "gout stone"    Chronic gout     Complication of anesthesia     knee scope, "hard time keeping BP up"    Diabetes mellitus     History of gout     Hyperlipidemia     Hypertension     Kidney calculi 05/2010    s/p lithotripsy    Pain in Achilles tendon 2007    Rash     scalp dryness, h/o Rosacea    Rosacea     Sleep apnea     uses CPAP    Type 2 diabetes mellitus, controlled      Past Surgical History:   Procedure Laterality Date    ACHILLES TENDON SURGERY Right 2005,2006    right x2    CYSTOSCOPY, RETROGRADE STENT PLACEMENT Left 07/11/2016    Procedure: CYSTOSCOPY, RETROGRADE STENT PLACEMENT LEFT;  Surgeon: Prescilla Sours, MD;  Location: [facility];  Service: Urology;  Laterality: Left;    CYSTOSCOPY, URETEROSCOPY, LASER LITHOTRIPSY Left 07/11/2016    Procedure: CYSTOSCOPY, URETEROSCOPY;  Surgeon: Prescilla Sours, MD;  Location: [facility];  Service: Urology;  Laterality: Left;    ENDOSCOPIC SINUS SURGERY (FESS) N/A 03/31/2016  Procedure: ENDOSCOPIC SINUS SURGERY (FESS), MAXILLARY ANTROSTOMY WITH MAXILLARY TISSUE REMOVAL;  Surgeon: Jaquelyn Bitter, MD;  Location: [facility];  Service: ENT;  Laterality: N/A;  Stereolactice computer assisted (navigational) cranial, extradural (image guidance for sinus surgery)    ETHMOIDECTOMY N/A 03/31/2016    Procedure: ENDOSCOPIC ETHMOIDECTOMY, TOTAL ANTERIOR AND POSTERIOR;  Surgeon: Jaquelyn Bitter, MD;  Location: [facility];  Service: ENT;  Laterality: N/A;    iris nevus  06/1991    removed    KNEE ARTHROSCOPY  01/2008    left knee    LITHOTRIPSY      x2    REPLACEMENT TOTAL KNEE Left 04/15/2017    SUSPENSION, HYOID N/A 03/31/2016    Procedure: SUSPENSION, HYOID MYOTOMY;  Surgeon: Jaquelyn Bitter, MD;  Location: [facility];  Service: ENT;  Laterality: N/A;    TURBINOPLASTY/SMR N/A 03/31/2016    Procedure: TURBINATES RESECTIONS USING SUBMUCOSAL APPROACH,  SEPTO/SMR W/WO CARTILAGE SCORING/CONTOURING Charline Bills;  Surgeon: Jaquelyn Bitter, MD;  Location: [facility];  Service: ENT;  Laterality: N/A;     Family History   Problem Relation Age of Onset    Alzheimer's disease Mother     Cancer Mother         skin    Heart disease Father         x5 Bypasses    Diabetes Father     Cancer Father         prostate, skin    Hypertension Brother     Diabetes Brother     Hyperlipidemia Neg Hx     Kidney disease Neg Hx     Stroke Neg Hx     Colon cancer Neg Hx     Malignant hyperthermia Neg Hx     Anesthesia problems Neg Hx      Social History     Tobacco Use    Smoking status: Former Smoker     Packs/day: 1.50     Years: 20.00     Pack years: 30.00     Types: Cigarettes     Last attempt to quit: 03/20/1996     Years since quitting: 22.2    Smokeless tobacco: Never Used   Substance Use Topics    Alcohol use: Yes     Comment: Infrequent         Review of Systems   Eyes: Negative for visual disturbance.   Endocrine: Negative for polyuria.   Neurological: Negative for numbness.           Objective:    Physical Exam  Constitutional:       Appearance: He is well-developed.      Comments: Obese   Psychiatric:         Mood and Affect: Mood normal.         Judgment: Judgment normal.         On prior exam from 12/16/17:  Comments: Obese   Skin:     Comments: On inspection and palpation, no evidence of skin breakdown or ulceration on feet.   Neurological:      Deep Tendon Reflexes:      Reflex Scores:       Achilles reflexes are 0 on the right side and 0 on the left side.     Comments: Intact monofilament, vibratory, and foot proprioception bilaterally.   Psychiatric:         Mood and Affect: Mood normal.  Judgment: Judgment normal.         On prior exam from 06/11/17:  Obese, ambulating with cane.   Neurological:   Reflex Scores:       Achilles reflexes are 0 on the right side and 0 on the left side.  Intact monofilament, vibratory, and foot proprioception  bilaterally.   Skin:   On inspection and palpation, no evidence of skin breakdown or ulceration on feet.   Psychiatric: He has a normal mood and affect. Judgment normal.         On prior exam from 04/22/16:  Obese   HENT:   Mouth/Throat: Oropharynx is clear and moist. Normal dentition.   Eyes: EOM are normal. Pupils are equal, round, and reactive to light.   Left eye iris irregular   Neck: No tracheal deviation present. No thyroid mass and no thyromegaly present.   Cardiovascular: Normal rate and regular rhythm.    No murmur heard.  Pulses:       Carotid pulses are 2+ on the right side, and 2+ on the left side.  No carotid bruits   Pulmonary/Chest: Effort normal and breath sounds normal.   Abdominal: There is no hepatosplenomegaly. There is no tenderness.   Neurological:   Reflex Scores:       Achilles reflexes are 0 on the right side and 0 on the left side.  Foot exam: intact monofilament (except right big toe diminished), vibratory and foot proprioception bilaterally   Skin:   On inspection and palpation, no skin breakdown, ulceration, corns or calluses.  Dry skin on big toes   Psychiatric: He has a normal mood and affect. Judgment normal.         Prior labs: 02/26/12: A1c 6.7, Tchol 116, TG 68, HDL 33, LDL 49, Cr 1.23, nl LFTs, Ca 9.1, Na 139, K 4.1; 03/28/12: 8 AM: plasma frac metanephrines 83 (<205), PRA 15.35 (0.25-5.82), Aldosterone 2 ng/dl- PRA elevated likely on context of HCTZ and ACE inhibitor; salivary cortisols 0.07, 0.13- not concerning for [place].  No concerning evidence for hyperfunctionality.  06/28/14: A1c 8.3, ualb/Cr 71, Hct 44.6, Cr 1.0, APhos 109, Tchol 162, TG 270, HDL 32, LDL 76, TSH 2.08; 78/29/56: [facility] labs: Cr 1.1, A1c 9.6, nl LFTs, Tchol 119, TG 219, HDL 32, LDL 43, ualb/Cr 12.6; 04/22/16: POC A1c 7.3; 07/10/16: [facility] labs: Cr 1.5, nl LFTs, Ca 8.8, Hct 37.3; 12/23/16: A1c 6.4, Cr 1.3, Ca 9.4, nl LFTs, ualb/Cr 6; 06/08/17: A1c 5.9, Tchol 129, TG 143, HDL 40, LDL 64; 12/13/17: A1c  7.4, ualb/Cr 3, Cr 1.24, Ca 9, nl LFTs    Prior radiology: [facility] CT abd/pelvis 01/2008: Bilateral low density adrenal gland nodules are seen, largest on the right measuring 2 cm. Bilateral low density adrenal gland nodules, probable adenomas. Bilateral nonobstructing renal calculi. 05/2011: [facility] CT abd/pelvis adrenal adenomas unchanged.       Radiology 03/24/12: Compared to  imaging from 01/2008: bilateral adrenal adenomas noted with right side largest at 2 cm (unchanged from 2009) low attenation.        Assessment:       63 y.o. -year-old male with history of type 2 diabetes without known complications.        Plan:       1. Diabetes type 2 (stable): Will check hemoglobin A1c again with labs for next visit.  Based on recent A1c and unknown blood sugar pattern of fasting sugars and prandial/post-prandial sugars, will adjust medication/insulin as follows: continue metformin 500  mg qd (but may need to increase back to bid depending on sugars) and weekly Bydureon SC.  Check sugars AM fasting and 2 hours after biggest meal of day with goal fasting/pre-meal range of 80-130; goal postprandial range of 80-160.  Is due for eye exam this summer; will screen for nephropathy and check spot urine microalbumin/Cr and CMP with labs for next visit.  Consider seeing podiatry for foot care in the future.  Continue carbohydrate-controlled diet.  F/U in 4-6 months to reassess.    2. Hypertension (stable): goal blood pressure is <130/80.  Controlled on regimen of Lisinopril 20 mg, HCTZ 25 mg daily, metoprolol 25 mg bid; states home blood pressures 120s/80s    3. Hyperlipidemia (stable): controlled on Zocor 20 mg; goal LDL<100.  Continue low cholesterol, low saturated fat diet.  Check fasting lipid panel with labs for next visit.    4. Overweight status/Obesity (stable): counseled patient on at least 150 min/wk of moderate-intensity exercise and consider flexibility/strength training exercises if possible.  Physical activity  programs should begin slowly and build up gradually; nutritional counseling status.    5. Hypoglycemia management (stable): counseled patient on need to keep sugar source with him at all times and medic-alert bracelet/indicator specifying diagnosis of diabetes    6. Adrenal adenoma (stable): given prior workup was nonrevealing,will defer on further imaging tests given nearly 5 years of surveillance; could consider repeat hormonal evaluation in future if symptoms worsen.        A total of 15 minutes were spent with the patient, of which half the time was spent counseling the patient on proper administration of medication/insulin, other drug therapy options, dietary recommendations, goals of diabetes therapy, and summarizing potential complications of diabetes.    The patient provided verbal consent for today's video visit encounter.    ADDENDUM: 10/04/18: received call from NP in [place] regarding upcoming knee replacement surgery who states A1c of 10 and Cr of 1.12; agreed he should follow up, delay surgery and bump up metformin.

## 2018-06-15 ENCOUNTER — Telehealth: Admitting: Endocrinology, Diabetes and Metabolism

## 2018-06-15 ENCOUNTER — Encounter (INDEPENDENT_AMBULATORY_CARE_PROVIDER_SITE_OTHER): Payer: Self-pay | Admitting: Endocrinology, Diabetes and Metabolism

## 2018-06-15 DIAGNOSIS — E785 Hyperlipidemia, unspecified: Secondary | ICD-10-CM

## 2018-06-15 DIAGNOSIS — I10 Essential (primary) hypertension: Secondary | ICD-10-CM

## 2018-06-15 DIAGNOSIS — E119 Type 2 diabetes mellitus without complications: Secondary | ICD-10-CM

## 2018-06-15 MED ORDER — METFORMIN HCL ER 500 MG PO TB24
500.00 mg | ORAL_TABLET | Freq: Every morning | ORAL | 1 refills | Status: DC
Start: 2018-06-15 — End: 2018-06-15

## 2018-06-15 MED ORDER — EXENATIDE ER 2 MG/0.85ML SC AUIJ
2.00 mg | AUTO-INJECTOR | SUBCUTANEOUS | 1 refills | Status: DC
Start: 2018-06-15 — End: 2019-01-13

## 2018-06-15 NOTE — Progress Notes (Signed)
Labs mailed

## 2018-06-15 NOTE — Patient Instructions (Signed)
Please note that in the future if you need prescription refills, plan ahead and call our clinic during normal clinic hours only to ensure these get done in a timely fashion.  If you are overdue for your follow up appointment, we may ask that you come in for a visit before refilling the medications.  Thanks for your understanding.    Be sure you arrive to your appointments on time in the future.  If you are late by 10 minutes or more, we may ask you reschedule because it is discourteous to patients on our schedule who do come in on time to ask them to wait because you were late.    Recommended follow-up from today's visit: 4-6 months; be sure to get your next labs 1-2 weeks before next visit      Be sure to bring your glucose meter or blood sugar log next visit for review of sugars.    Diabetes General Instructions:  Take A+ care of yourself!  Failure to get the results can lead to the devastating complications of diabetes.  You can help to avoid this misery by following our advice, and by controlling all aspects of your disease to the best of your ability.  I look forward to helping you improve your control.  1.  Meal Plan:   a.  Control your calorie intake to avoid gaining weight.   b.  Please eat 3 regular meals, dont skip. Eat very healthy foods, including vegetables, fruits, whole grains, nuts (in limitation), and fish.  Use olive or canola oil as your oil source.  No fruit juices or regular sodas.   c.  Sodium restriction <1500 mg daily.  2.  Exercise:   Activity:  Consider an activity you can be comfortable with such as walking, jogging, running, swimming, or weights.  Try to walk at least 5 days a week for 30 minutes each day.  Start slowly and gradually build up your stamina.  You can do this.  Find activities that you enjoy!  Be persistent and enthusiastic. Dont injure yourself and do not exercise to the point of exhaustion.  Be very careful.  Consult your MD.  3.  Daily careful shoe and foot exam.  If  you cannot see your feet clearly and entirely please either use a mirror and/or ask a relative to do the exam for you (see below)  4.  Blood Sugar Monitoring:       a. Please check your sugars 1-2 times each day.  Record your glucose values in a log.  Please bring your glucose readings or meter to each and every visit with each of your diabetes care-providers.  If you are checking your sugars twice daily you can vary the times: before breakfast and dinner one day, and before lunch and 2 hours after dinner the next day.  It is important to check your sugars 2 hours after meals to see how high the glucose readings are at this time.  If you are on short acting and long acting insulins, you need to check sugars before each meal and bedtime at the very least.       b.  Ideal blood sugar levels 80 to 130 before and 80 to 160 2hr after meals.  5.  Yearly ophthalmology exam- be sure to have your ophthalmologist forward [place] a copy of his exam report.  See your dentist every 6 months.  Get flu shot yearly.  6.  Labs: typically get comprehensive metabolic panel  and fasting lipid panel once per year (if controlled); A1c tests are done 2-4 times per year (depending on control)  7.  Continued follow-up: every 3 months (or every 6 months if excellent control).  8.  Insulin can be injected anywhere where you can pinch up fat (typically belly, inner thighs or outside of arms).  9.  Most diabetes medications (other than metformin and Actos) can cause low blood sugars.  If you experience symptoms such as shakiness, weakness, confusion, lightheadedness, or sweats, this may indicate your blood sugar is low.  In this situation, please check your blood sugar.  If it is low (<70), have a small snack such as 4 oz of orange juice, 1 cup of milk, 4-5 pieces of hard candy or 3-4 glucose tablets.  Recheck sugar in 15 minutes.  If not improved, can repeat as needed.  10.  Blood pressure surveillance and cholesterol surveillance are also important  parts of minimizing the risk for heart disease in the future.  Typically the goal blood pressure is < 130/80 and goal LDL (bad cholesterol) is <100 (for patient already with heart disease the LDL should be <70).  11.  Regarding medication for your diabetes: continue metformin 500 mg twice daily with meals and continue Bydureon once weekly.    12.  Diabetes foot care DO's and DON'TS:  DOs:   Wash your feet daily with warm water & soap.   Never soak your feet without first checking the temperature of the water by hand   Dry your feet well, especially between the toes   If the skin on your feet is dry, apply lotion to them everyday after bathing.  If your feet sweat a large amount, apply powder to them everyday   Check your feet daily for blisters, cuts, sores, redness or swelling.  Check carefully between your toes.  Use a mirror to check the bottom of your feet.  Notify your doctor right away if you find something wrong   Use an emery board gently to shape toenails even with the ends of the toes   File calluses or rough skin with an emery board to remove dead skin.  This should be done after washing the feet to help soften the skin.   Cut your toenails straight across (to prevent ingrown toenails)   Wear socks or stockings without seams or bumps that are clean and soft and well fitting   Keep your feet warm and dry   Wear shoes that fit well and do not rub toes or heels   Examine your shoes daily for cracks, pebbles, nails or anything that could hurt your feet   See your podiatrist (foot doctor) if you have a wound that is not healing    DONTs:   Never walk barefoot indoors or outdoors   Never use a hot water bottle or heating pad on your feet   Dont put lotion between your toes   Never use a knife or razor blade to cut your toenails or feet   Dont use chemicals or corn and callous removers yourself   Never rip off a hangnail   Never wear garters, tight socks or other clothing which cuts off  circulation to your feet   Dont sit with your legs crossed at the knees as this slows blood flow to your feet   Dont smoke!

## 2018-06-15 NOTE — Progress Notes (Signed)
Lab slip mailed.

## 2018-06-15 NOTE — Addendum Note (Signed)
Addended byLavonna Rua on: 06/15/2018 11:59 AM     Modules accepted: Orders

## 2018-06-21 ENCOUNTER — Encounter (INDEPENDENT_AMBULATORY_CARE_PROVIDER_SITE_OTHER): Payer: Self-pay

## 2018-06-22 ENCOUNTER — Telehealth (INDEPENDENT_AMBULATORY_CARE_PROVIDER_SITE_OTHER): Payer: Self-pay | Admitting: Endocrinology, Diabetes and Metabolism

## 2018-06-22 MED ORDER — METFORMIN HCL ER 500 MG PO TB24
500.00 mg | ORAL_TABLET | Freq: Every morning | ORAL | 1 refills | Status: DC
Start: 2018-06-22 — End: 2018-10-06

## 2018-06-22 NOTE — Telephone Encounter (Signed)
Left message for patient to confirm where he wants [place] to send order for Metformin.

## 2018-06-22 NOTE — Addendum Note (Signed)
Addended byLavonna Rua on: 06/22/2018 12:59 PM     Modules accepted: Orders

## 2018-07-14 NOTE — Telephone Encounter (Signed)
Pt called back stating he want the Rx to be mailed to his home address.   Thank you

## 2018-07-14 NOTE — Telephone Encounter (Signed)
Prescription for metformin mailed to patient's home address per his request.

## 2018-10-05 NOTE — Progress Notes (Addendum)
Subjective:       Patient ID: Francis Trevino is a 63 y.o. male.    HPI      Francis Trevino is following up (self-referred) for consultation regarding diabetes.    Francis Trevino is a 63 y.o. -year-old male with a history of diabetes since 2012.    In terms of severity with regards to complications, the patient does not have retinopathy, does not have nephropathy, does not have neuropathy, and does not have cardiovascular disease.     With regards to the quality of blood sugar control, the most recent hemoglobin A1c test was 10.4 in July.  The patient checks blood sugars 0-1 times per day (is not checking much but states AM fasting around 130s and 2 hrs post meal 130s this past week).   Current anti-hyperglycemic regimen is comprised of metformin 500 mg qd but now increased to bid this past week, weekly bydureon SC.  Other medications tried in the past include: glipizide 2.5 mg qd     In terms of associated symptoms at this time, the patient does not have polyuria, does not have blurred vision, does have paresthesias.  With regards to gastroparesis symptoms, the patient does not have nausea and does not have diarrhea    Per the patients history, last fundoscopic exam was July 2019 and last podiatry exam was in 2017.  Patient had a 0 lb weight decrease in the past 5 months.    .   Currently the patients diet comprises of 3 meals per day.  Prior formal diabetes education/ nutritional counseling has been performed.       Current frequency of hypoglycemia is rare.  Symptoms include unknown.  The patient does not have a medic alert bracelet/ID that indicates diabetes status.  The patient does not have an updated glucagon emergency kit.  The patient currently lives with wife.    Had recent knee surgery- underwent PT but now planning surgery.  Had left lateral lower leg numbness in context of nerve injury intra-operatively.  Had recent bout of chest pains and sinus infection.  States cardiac workup was negative but had  missed some blood pressure medications around that time.    10/04/18: received call from NP in [place] regarding upcoming knee replacement surgery who states A1c of 10 and Cr of 1.12; agreed he should follow up, delay surgery and bump up metformin.  Now his surgeon, Dr. Burgess Estelle, postponed surgery to 9/10- plans to get another set of labs then at [facility]; surgeon is targeting A1c in 7s-8 range.  Pt admits to dietary indiscretions during pandemic.          Past Medical History:   Diagnosis Date    Abnormal vision     wear glasses    Arthritis     Calcium oxalate kidney stones 05/2010    s/p lithotripsy, and "gout stone"    Chronic gout     Complication of anesthesia     knee scope, "hard time keeping BP up"    Diabetes mellitus     History of gout     Hyperlipidemia     Hypertension     Kidney calculi 05/2010    s/p lithotripsy    Pain in Achilles tendon 2007    Rash     scalp dryness, h/o Rosacea    Rosacea     Sleep apnea     uses CPAP    Type 2 diabetes mellitus, controlled  Past Surgical History:   Procedure Laterality Date    ACHILLES TENDON SURGERY Right 2005,2006    right x2    CYSTOSCOPY, RETROGRADE STENT PLACEMENT Left 07/11/2016    Procedure: CYSTOSCOPY, RETROGRADE STENT PLACEMENT LEFT;  Surgeon: Prescilla Sours, MD;  Location: [facility];  Service: Urology;  Laterality: Left;    CYSTOSCOPY, URETEROSCOPY, LASER LITHOTRIPSY Left 07/11/2016    Procedure: CYSTOSCOPY, URETEROSCOPY;  Surgeon: Prescilla Sours, MD;  Location: [facility];  Service: Urology;  Laterality: Left;    ENDOSCOPIC SINUS SURGERY (FESS) N/A 03/31/2016    Procedure: ENDOSCOPIC SINUS SURGERY (FESS), MAXILLARY ANTROSTOMY WITH MAXILLARY TISSUE REMOVAL;  Surgeon: Jaquelyn Bitter, MD;  Location: [facility];  Service: ENT;  Laterality: N/A;  Stereolactice computer assisted (navigational) cranial, extradural (image guidance for sinus surgery)    ETHMOIDECTOMY N/A 03/31/2016    Procedure: ENDOSCOPIC ETHMOIDECTOMY,  TOTAL ANTERIOR AND POSTERIOR;  Surgeon: Jaquelyn Bitter, MD;  Location: [facility];  Service: ENT;  Laterality: N/A;    iris nevus  06/1991    removed    KNEE ARTHROSCOPY  01/2008    left knee    LITHOTRIPSY      x2    REPLACEMENT TOTAL KNEE Left 04/15/2017    SUSPENSION, HYOID N/A 03/31/2016    Procedure: SUSPENSION, HYOID MYOTOMY;  Surgeon: Jaquelyn Bitter, MD;  Location: [facility];  Service: ENT;  Laterality: N/A;    TURBINOPLASTY/SMR N/A 03/31/2016    Procedure: TURBINATES RESECTIONS USING SUBMUCOSAL APPROACH, SEPTO/SMR W/WO CARTILAGE SCORING/CONTOURING Charline Bills;  Surgeon: Jaquelyn Bitter, MD;  Location: [facility];  Service: ENT;  Laterality: N/A;     Family History   Problem Relation Age of Onset    Alzheimer's disease Mother     Cancer Mother         skin    Aneurysm Mother 49        died of AAA    Heart disease Father         x5 Bypasses    Diabetes Father     Cancer Father 8        died of lung. hx of skin, prostate    Hyperlipidemia Father     Ulcers Father         peptic    Hypertension Brother     Diabetes Brother     Kidney disease Neg Hx     Stroke Neg Hx     Colon cancer Neg Hx     Malignant hyperthermia Neg Hx     Anesthesia problems Neg Hx      Social History     Tobacco Use    Smoking status: Former Smoker     Packs/day: 1.50     Years: 20.00     Pack years: 30.00     Types: Cigarettes     Last attempt to quit: 03/20/1996     Years since quitting: 22.5    Smokeless tobacco: Never Used   Substance Use Topics    Alcohol use: Yes     Comment: Infrequent         Review of Systems   Eyes: Negative for visual disturbance.   Endocrine: Negative for polyuria.   Neurological: Positive for numbness.           Objective:    Physical Exam  Constitutional:       Appearance: He is well-developed.   Psychiatric:  Mood and Affect: Mood normal.         Judgment: Judgment normal.           On prior exam from 12/16/17:  Comments: Obese   Skin:     Comments: On  inspection and palpation, no evidence of skin breakdown or ulceration on feet.   Neurological:      Deep Tendon Reflexes:      Reflex Scores:       Achilles reflexes are 0 on the right side and 0 on the left side.     Comments: Intact monofilament, vibratory, and foot proprioception bilaterally.   Psychiatric:         Mood and Affect: Mood normal.         Judgment: Judgment normal.         On prior exam from 06/11/17:  Obese, ambulating with cane.   Neurological:   Reflex Scores:       Achilles reflexes are 0 on the right side and 0 on the left side.  Intact monofilament, vibratory, and foot proprioception bilaterally.   Skin:   On inspection and palpation, no evidence of skin breakdown or ulceration on feet.   Psychiatric: He has a normal mood and affect. Judgment normal.         On prior exam from 04/22/16:  Obese   HENT:   Mouth/Throat: Oropharynx is clear and moist. Normal dentition.   Eyes: EOM are normal. Pupils are equal, round, and reactive to light.   Left eye iris irregular   Neck: No tracheal deviation present. No thyroid mass and no thyromegaly present.   Cardiovascular: Normal rate and regular rhythm.    No murmur heard.  Pulses:       Carotid pulses are 2+ on the right side, and 2+ on the left side.  No carotid bruits   Pulmonary/Chest: Effort normal and breath sounds normal.   Abdominal: There is no hepatosplenomegaly. There is no tenderness.   Neurological:   Reflex Scores:       Achilles reflexes are 0 on the right side and 0 on the left side.  Foot exam: intact monofilament (except right big toe diminished), vibratory and foot proprioception bilaterally   Skin:   On inspection and palpation, no skin breakdown, ulceration, corns or calluses.  Dry skin on big toes   Psychiatric: He has a normal mood and affect. Judgment normal.         Prior labs: 02/26/12: A1c 6.7, Tchol 116, TG 68, HDL 33, LDL 49, Cr 1.23, nl LFTs, Ca 9.1, Na 139, K 4.1; 03/28/12: 8 AM: plasma frac metanephrines 83 (<205), PRA 15.35  (0.25-5.82), Aldosterone 2 ng/dl- PRA elevated likely on context of HCTZ and ACE inhibitor; salivary cortisols 0.07, 0.13- not concerning for [place].  No concerning evidence for hyperfunctionality.  06/28/14: A1c 8.3, ualb/Cr 71, Hct 44.6, Cr 1.0, APhos 109, Tchol 162, TG 270, HDL 32, LDL 76, TSH 2.08; 16/10/96: [facility] labs: Cr 1.1, A1c 9.6, nl LFTs, Tchol 119, TG 219, HDL 32, LDL 43, ualb/Cr 12.6; 04/22/16: POC A1c 7.3; 07/10/16: [facility] labs: Cr 1.5, nl LFTs, Ca 8.8, Hct 37.3; 12/23/16: A1c 6.4, Cr 1.3, Ca 9.4, nl LFTs, ualb/Cr 6; 06/08/17: A1c 5.9, Tchol 129, TG 143, HDL 40, LDL 64; 12/13/17: A1c 7.4, ualb/Cr 3, Cr 1.24, Ca 9, nl LFTs; 09/27/18 [facility] labs: Hct 40.2, Tchol 147, TG 222, HDL 33, LDL 77, Cr 1.19, Ca 8.9, A1c 10.4; ADDENDUM: 11/04/18 [facility] labs:  A1c 8.3, Cr 1.25, Ca 9, nl LFTs    Prior radiology: [facility] CT abd/pelvis 01/2008: Bilateral low density adrenal gland nodules are seen, largest on the right measuring 2 cm. Bilateral low density adrenal gland nodules, probable adenomas. Bilateral nonobstructing renal calculi. 05/2011: [facility] CT abd/pelvis adrenal adenomas unchanged.      [facility] 03/24/12: Compared to [facility] imaging from 01/2008: bilateral adrenal adenomas noted with right side largest at 2 cm (unchanged from 2009) low attenation.        Assessment:       62 y.o. -year-old male with history of type 2 diabetes without known complications.        Plan:       1. Diabetes type 2 (stable): Will check hemoglobin A1c again with labs for next visit- will have done at [facility].  Based on recent A1c and unknown blood sugar pattern of fasting sugars and prandial/post-prandial sugars, will adjust medication/insulin as follows: continue metformin 500 mg bid and weekly Bydureon SC.  Check sugars AM fasting and 2 hours after biggest meal of day with goal fasting/pre-meal range of 80-130; goal postprandial range of 80-160.  Is due for eye exam this summer; will screen for nephropathy and check spot  urine microalbumin/Cr and CMP in coming months.  Consider seeing podiatry for foot care in the future.  Continue carbohydrate-controlled diet.  F/U in 2-3 months to reassess.    2. Hypertension (stable): goal blood pressure is <130/80.  Controlled on regimen of Lisinopril 20 mg, HCTZ 25 mg daily, metoprolol 25 mg bid; states home blood pressures 120s/80s- unable to assess at this time.    3. Hyperlipidemia (stable): controlled on Zocor 20 mg; goal LDL<100.  Continue low cholesterol, low saturated fat diet.      4. Overweight status/Obesity (stable): counseled patient on at least 150 min/wk of moderate-intensity exercise and consider flexibility/strength training exercises if possible.  Physical activity programs should begin slowly and build up gradually; nutritional counseling status.    5. Hypoglycemia management (stable): counseled patient on need to keep sugar source with him at all times and medic-alert bracelet/indicator specifying diagnosis of diabetes    6. Adrenal adenoma (stable): given prior workup was nonrevealing,will defer on further imaging tests given nearly 5 years of surveillance; could consider repeat hormonal evaluation in future if symptoms worsen.        A total of 25 minutes were spent with the patient, of which half the time was spent counseling the patient on proper administration of medication/insulin, other drug therapy options, dietary recommendations, goals of diabetes therapy, and summarizing potential complications of diabetes.    The patient provided verbal consent for today's video visit encounter.

## 2018-10-06 ENCOUNTER — Encounter (INDEPENDENT_AMBULATORY_CARE_PROVIDER_SITE_OTHER): Payer: Self-pay | Admitting: Endocrinology, Diabetes and Metabolism

## 2018-10-06 ENCOUNTER — Telehealth (INDEPENDENT_AMBULATORY_CARE_PROVIDER_SITE_OTHER): Admitting: Endocrinology, Diabetes and Metabolism

## 2018-10-06 DIAGNOSIS — Z87891 Personal history of nicotine dependence: Secondary | ICD-10-CM

## 2018-10-06 DIAGNOSIS — Z7984 Long term (current) use of oral hypoglycemic drugs: Secondary | ICD-10-CM

## 2018-10-06 DIAGNOSIS — E785 Hyperlipidemia, unspecified: Secondary | ICD-10-CM

## 2018-10-06 DIAGNOSIS — I10 Essential (primary) hypertension: Secondary | ICD-10-CM

## 2018-10-06 DIAGNOSIS — D3500 Benign neoplasm of unspecified adrenal gland: Secondary | ICD-10-CM

## 2018-10-06 DIAGNOSIS — E119 Type 2 diabetes mellitus without complications: Secondary | ICD-10-CM

## 2018-10-06 MED ORDER — METFORMIN HCL ER 500 MG PO TB24
500.00 mg | ORAL_TABLET | Freq: Two times a day (BID) | ORAL | 1 refills | Status: DC
Start: 2018-10-06 — End: 2019-05-19

## 2018-10-06 MED ORDER — FREESTYLE LITE TEST VI STRP
ORAL_STRIP | 3 refills | Status: DC
Start: 2018-10-06 — End: 2018-10-10

## 2018-10-06 NOTE — Patient Instructions (Signed)
Please note that in the future if you need prescription refills, plan ahead and call our clinic during normal clinic hours only to ensure these get done in a timely fashion.  If you are overdue for your follow up appointment, we may ask that you come in for a visit before refilling the medications.  Thanks for your understanding.    Be sure you arrive to your appointments on time in the future.  If you are late by 10 minutes or more, we may ask you reschedule because it is discourteous to patients on our schedule who do come in on time to ask them to wait because you were late.    Recommended follow-up from today's visit: 3 months; be sure to get your next labs 1-2 weeks before next visit      Be sure to bring your glucose meter or blood sugar log next visit for review of sugars.    Diabetes General Instructions:  Take A+ care of yourself!  Failure to get the results can lead to the devastating complications of diabetes.  You can help to avoid this misery by following our advice, and by controlling all aspects of your disease to the best of your ability.  I look forward to helping you improve your control.  1.  Meal Plan:   a.  Control your calorie intake to avoid gaining weight.   b.  Please eat 3 regular meals, dont skip. Eat very healthy foods, including vegetables, fruits, whole grains, nuts (in limitation), and fish.  Use olive or canola oil as your oil source.  No fruit juices or regular sodas.   c.  Sodium restriction <1500 mg daily.  2.  Exercise:   Activity:  Consider an activity you can be comfortable with such as walking, jogging, running, swimming, or weights.  Try to walk at least 5 days a week for 30 minutes each day.  Start slowly and gradually build up your stamina.  You can do this.  Find activities that you enjoy!  Be persistent and enthusiastic. Dont injure yourself and do not exercise to the point of exhaustion.  Be very careful.  Consult your MD.  3.  Daily careful shoe and foot exam.  If  you cannot see your feet clearly and entirely please either use a mirror and/or ask a relative to do the exam for you (see below)  4.  Blood Sugar Monitoring:       a. Please check your sugars 1-2 times each day.  Record your glucose values in a log.  Please bring your glucose readings or meter to each and every visit with each of your diabetes care-providers.  If you are checking your sugars twice daily you can vary the times: before breakfast and dinner one day, and before lunch and 2 hours after dinner the next day.  It is important to check your sugars 2 hours after meals to see how high the glucose readings are at this time.  If you are on short acting and long acting insulins, you need to check sugars before each meal and bedtime at the very least.       b.  Ideal blood sugar levels 80 to 130 before and 80 to 160 2hr after meals.  5.  Yearly ophthalmology exam- be sure to have your ophthalmologist forward [place] a copy of his exam report.  See your dentist every 6 months.  Get flu shot yearly.  6.  Labs: typically get comprehensive metabolic panel  and fasting lipid panel once per year (if controlled); A1c tests are done 2-4 times per year (depending on control)  7.  Continued follow-up: every 3 months (or every 6 months if excellent control).  8.  Insulin can be injected anywhere where you can pinch up fat (typically belly, inner thighs or outside of arms).  9.  Most diabetes medications (other than metformin and Actos) can cause low blood sugars.  If you experience symptoms such as shakiness, weakness, confusion, lightheadedness, or sweats, this may indicate your blood sugar is low.  In this situation, please check your blood sugar.  If it is low (<70), have a small snack such as 4 oz of orange juice, 1 cup of milk, 4-5 pieces of hard candy or 3-4 glucose tablets.  Recheck sugar in 15 minutes.  If not improved, can repeat as needed.  10.  Blood pressure surveillance and cholesterol surveillance are also important  parts of minimizing the risk for heart disease in the future.  Typically the goal blood pressure is < 130/80 and goal LDL (bad cholesterol) is <100 (for patient already with heart disease the LDL should be <70).  11.  Regarding medication for your diabetes: continue metformin 500 mg twice daily with meals and continue Bydureon once weekly.    12.  Diabetes foot care DO's and DON'TS:  DOs:   Wash your feet daily with warm water & soap.   Never soak your feet without first checking the temperature of the water by hand   Dry your feet well, especially between the toes   If the skin on your feet is dry, apply lotion to them everyday after bathing.  If your feet sweat a large amount, apply powder to them everyday   Check your feet daily for blisters, cuts, sores, redness or swelling.  Check carefully between your toes.  Use a mirror to check the bottom of your feet.  Notify your doctor right away if you find something wrong   Use an emery board gently to shape toenails even with the ends of the toes   File calluses or rough skin with an emery board to remove dead skin.  This should be done after washing the feet to help soften the skin.   Cut your toenails straight across (to prevent ingrown toenails)   Wear socks or stockings without seams or bumps that are clean and soft and well fitting   Keep your feet warm and dry   Wear shoes that fit well and do not rub toes or heels   Examine your shoes daily for cracks, pebbles, nails or anything that could hurt your feet   See your podiatrist (foot doctor) if you have a wound that is not healing    DONTs:   Never walk barefoot indoors or outdoors   Never use a hot water bottle or heating pad on your feet   Dont put lotion between your toes   Never use a knife or razor blade to cut your toenails or feet   Dont use chemicals or corn and callous removers yourself   Never rip off a hangnail   Never wear garters, tight socks or other clothing which cuts off  circulation to your feet   Dont sit with your legs crossed at the knees as this slows blood flow to your feet   Dont smoke!

## 2018-10-10 ENCOUNTER — Encounter (INDEPENDENT_AMBULATORY_CARE_PROVIDER_SITE_OTHER): Payer: Self-pay | Admitting: Endocrinology, Diabetes and Metabolism

## 2018-10-10 ENCOUNTER — Other Ambulatory Visit (INDEPENDENT_AMBULATORY_CARE_PROVIDER_SITE_OTHER): Payer: Self-pay | Admitting: "Endocrinology

## 2018-10-10 NOTE — Telephone Encounter (Signed)
Patient would like order for test strips to be mailed to him instead so he can take it to a [facility]. Patient is aware that this cannot be addressed until Thursday and we will mail the order to him as available.

## 2018-10-13 MED ORDER — FREESTYLE LITE TEST VI STRP
ORAL_STRIP | 3 refills | Status: AC
Start: 2018-10-13 — End: ?

## 2018-11-05 ENCOUNTER — Encounter (INDEPENDENT_AMBULATORY_CARE_PROVIDER_SITE_OTHER): Payer: Self-pay | Admitting: Endocrinology, Diabetes and Metabolism

## 2018-11-06 ENCOUNTER — Encounter (INDEPENDENT_AMBULATORY_CARE_PROVIDER_SITE_OTHER): Payer: Self-pay | Admitting: Endocrinology, Diabetes and Metabolism

## 2018-11-07 ENCOUNTER — Encounter (INDEPENDENT_AMBULATORY_CARE_PROVIDER_SITE_OTHER): Payer: Self-pay | Admitting: Endocrinology, Diabetes and Metabolism

## 2018-11-07 ENCOUNTER — Other Ambulatory Visit (INDEPENDENT_AMBULATORY_CARE_PROVIDER_SITE_OTHER): Payer: Self-pay | Admitting: Endocrinology, Diabetes and Metabolism

## 2018-11-08 ENCOUNTER — Other Ambulatory Visit (INDEPENDENT_AMBULATORY_CARE_PROVIDER_SITE_OTHER): Payer: Self-pay | Admitting: Endocrinology, Diabetes and Metabolism

## 2018-11-08 MED ORDER — GLIPIZIDE ER 5 MG PO TB24
5.00 mg | ORAL_TABLET | Freq: Every day | ORAL | 1 refills | Status: DC
Start: 2018-11-08 — End: 2019-05-19

## 2019-01-13 ENCOUNTER — Other Ambulatory Visit (INDEPENDENT_AMBULATORY_CARE_PROVIDER_SITE_OTHER): Payer: Self-pay | Admitting: Endocrinology, Diabetes and Metabolism

## 2019-01-13 MED ORDER — BYDUREON BCISE 2 MG/0.85ML SC AUIJ
2.00 mg | AUTO-INJECTOR | SUBCUTANEOUS | 1 refills | Status: DC
Start: 2019-01-13 — End: 2019-05-19

## 2019-01-13 NOTE — Telephone Encounter (Signed)
Last visit 07/30, last labs 08/28, No Follow Up yet, patient will call next week to schedule.    Patient refill request for Bydureon to be sent to [facility].

## 2019-02-27 DIAGNOSIS — D485 Neoplasm of uncertain behavior of skin: Secondary | ICD-10-CM | POA: Insufficient documentation

## 2019-03-25 ENCOUNTER — Other Ambulatory Visit (INDEPENDENT_AMBULATORY_CARE_PROVIDER_SITE_OTHER): Payer: Self-pay | Admitting: Endocrinology, Diabetes and Metabolism

## 2019-04-27 ENCOUNTER — Other Ambulatory Visit (INDEPENDENT_AMBULATORY_CARE_PROVIDER_SITE_OTHER): Payer: Self-pay | Admitting: Endocrinology, Diabetes and Metabolism

## 2019-05-19 ENCOUNTER — Other Ambulatory Visit (INDEPENDENT_AMBULATORY_CARE_PROVIDER_SITE_OTHER): Payer: Self-pay

## 2019-05-19 MED ORDER — BYDUREON BCISE 2 MG/0.85ML SC AUIJ
2.00 mg | AUTO-INJECTOR | SUBCUTANEOUS | 1 refills | Status: AC
Start: 2019-05-19 — End: ?

## 2019-05-19 MED ORDER — METFORMIN HCL ER 500 MG PO TB24
500.00 mg | ORAL_TABLET | Freq: Two times a day (BID) | ORAL | 0 refills | Status: AC
Start: 2019-05-19 — End: ?

## 2019-05-19 MED ORDER — GLIPIZIDE ER 5 MG PO TB24
5.00 mg | ORAL_TABLET | Freq: Every day | ORAL | 0 refills | Status: AC
Start: 2019-05-19 — End: ?

## 2019-05-19 NOTE — Telephone Encounter (Signed)
[name] called requesting a short Rx. He made an appointment and is having his labs done next week.

## 2019-06-01 NOTE — Progress Notes (Deleted)
Subjective:       Patient ID: Francis Trevino is a 64 y.o. male.    HPI      Francis Trevino is following up (self-referred) for consultation regarding diabetes.    Francis Trevino is a 64 y.o. -year-old male with a history of diabetes since 2012.    In terms of severity with regards to complications, the patient does not have retinopathy, does not have nephropathy, does not have neuropathy, and does not have cardiovascular disease.     With regards to the quality of blood sugar control, the most recent hemoglobin A1c test was 10.4 in July.  The patient checks blood sugars 0-1 times per day (is not checking much but states AM fasting around 130s and 2 hrs post meal 130s this past week).   Current anti-hyperglycemic regimen is comprised of metformin 500 mg qd but now increased to bid this past week, weekly bydureon SC.  Other medications tried in the past include: glipizide 2.5 mg qd     In terms of associated symptoms at this time, the patient does not have polyuria, does not have blurred vision, does have paresthesias.  With regards to gastroparesis symptoms, the patient does not have nausea and does not have diarrhea    Per the patients history, last fundoscopic exam was July 2019 and last podiatry exam was in 2017.  Patient had a 0 lb weight decrease in the past 5 months.    .   Currently the patients diet comprises of 3 meals per day.  Prior formal diabetes education/ nutritional counseling has been performed.       Current frequency of hypoglycemia is rare.  Symptoms include unknown.  The patient does not have a medic alert bracelet/ID that indicates diabetes status.  The patient does not have an updated glucagon emergency kit.  The patient currently lives with wife.    Had recent knee surgery- underwent PT but now planning surgery.  Had left lateral lower leg numbness in context of nerve injury intra-operatively.  Had recent bout of chest pains and sinus infection.  States cardiac workup was negative but had  missed some blood pressure medications around that time.    10/04/18: received call from NP in [place] regarding upcoming knee replacement surgery who states A1c of 10 and Cr of 1.12; agreed he should follow up, delay surgery and bump up metformin.  Now his surgeon, Dr. Burgess Estelle, postponed surgery to 9/10- plans to get another set of labs then at [facility]; surgeon is targeting A1c in 7s-8 range.  Pt admits to dietary indiscretions during pandemic.    Lost to f/u since July.      Past Medical History:   Diagnosis Date    Abnormal vision     wear glasses    Arthritis     Calcium oxalate kidney stones 05/2010    s/p lithotripsy, and "gout stone"    Chronic gout     Complication of anesthesia     knee scope, "hard time keeping BP up"    Diabetes mellitus     History of gout     Hyperlipidemia     Hypertension     Kidney calculi 05/2010    s/p lithotripsy    Pain in Achilles tendon 2007    Rash     scalp dryness, h/o Rosacea    Rosacea     Sleep apnea     uses CPAP    Type 2 diabetes mellitus,  controlled      Past Surgical History:   Procedure Laterality Date    ACHILLES TENDON SURGERY Right 2005,2006    right x2    CYSTOSCOPY, RETROGRADE STENT PLACEMENT Left 07/11/2016    Procedure: CYSTOSCOPY, RETROGRADE STENT PLACEMENT LEFT;  Surgeon: Prescilla Sours, MD;  Location: [facility];  Service: Urology;  Laterality: Left;    CYSTOSCOPY, URETEROSCOPY, LASER LITHOTRIPSY Left 07/11/2016    Procedure: CYSTOSCOPY, URETEROSCOPY;  Surgeon: Prescilla Sours, MD;  Location: [facility];  Service: Urology;  Laterality: Left;    ENDOSCOPIC SINUS SURGERY (FESS) N/A 03/31/2016    Procedure: ENDOSCOPIC SINUS SURGERY (FESS), MAXILLARY ANTROSTOMY WITH MAXILLARY TISSUE REMOVAL;  Surgeon: Jaquelyn Bitter, MD;  Location: [facility];  Service: ENT;  Laterality: N/A;  Stereolactice computer assisted (navigational) cranial, extradural (image guidance for sinus surgery)    ETHMOIDECTOMY N/A 03/31/2016    Procedure:  ENDOSCOPIC ETHMOIDECTOMY, TOTAL ANTERIOR AND POSTERIOR;  Surgeon: Jaquelyn Bitter, MD;  Location: [facility];  Service: ENT;  Laterality: N/A;    iris nevus  06/1991    removed    KNEE ARTHROSCOPY  01/2008    left knee    LITHOTRIPSY      x2    REPLACEMENT TOTAL KNEE Left 04/15/2017    SUSPENSION, HYOID N/A 03/31/2016    Procedure: SUSPENSION, HYOID MYOTOMY;  Surgeon: Jaquelyn Bitter, MD;  Location: [facility];  Service: ENT;  Laterality: N/A;    TURBINOPLASTY/SMR N/A 03/31/2016    Procedure: TURBINATES RESECTIONS USING SUBMUCOSAL APPROACH, SEPTO/SMR W/WO CARTILAGE SCORING/CONTOURING Charline Bills;  Surgeon: Jaquelyn Bitter, MD;  Location: [facility];  Service: ENT;  Laterality: N/A;     Family History   Problem Relation Age of Onset    Alzheimer's disease Mother     Cancer Mother         skin    Aneurysm Mother 102        died of AAA    Heart disease Father         x5 Bypasses    Diabetes Father     Cancer Father 74        died of lung. hx of skin, prostate    Hyperlipidemia Father     Ulcers Father         peptic    Hypertension Brother     Diabetes Brother     Kidney disease Neg Hx     Stroke Neg Hx     Colon cancer Neg Hx     Malignant hyperthermia Neg Hx     Anesthesia problems Neg Hx      Social History     Tobacco Use    Smoking status: Former Smoker     Packs/day: 1.50     Years: 20.00     Pack years: 30.00     Types: Cigarettes     Quit date: 03/20/1996     Years since quitting: 23.2    Smokeless tobacco: Never Used   Substance Use Topics    Alcohol use: Yes     Comment: Infrequent         Review of Systems        Objective:    Physical Exam        On prior exam from 12/16/17:  Comments: Obese   Skin:     Comments: On inspection and palpation, no evidence of skin breakdown or ulceration on feet.   Neurological:  Deep Tendon Reflexes:      Reflex Scores:       Achilles reflexes are 0 on the right side and 0 on the left side.     Comments: Intact monofilament,  vibratory, and foot proprioception bilaterally.   Psychiatric:         Mood and Affect: Mood normal.         Judgment: Judgment normal.         On prior exam from 06/11/17:  Obese, ambulating with cane.   Neurological:   Reflex Scores:       Achilles reflexes are 0 on the right side and 0 on the left side.  Intact monofilament, vibratory, and foot proprioception bilaterally.   Skin:   On inspection and palpation, no evidence of skin breakdown or ulceration on feet.   Psychiatric: He has a normal mood and affect. Judgment normal.         On prior exam from 04/22/16:  Obese   HENT:   Mouth/Throat: Oropharynx is clear and moist. Normal dentition.   Eyes: EOM are normal. Pupils are equal, round, and reactive to light.   Left eye iris irregular   Neck: No tracheal deviation present. No thyroid mass and no thyromegaly present.   Cardiovascular: Normal rate and regular rhythm.    No murmur heard.  Pulses:       Carotid pulses are 2+ on the right side, and 2+ on the left side.  No carotid bruits   Pulmonary/Chest: Effort normal and breath sounds normal.   Abdominal: There is no hepatosplenomegaly. There is no tenderness.   Neurological:   Reflex Scores:       Achilles reflexes are 0 on the right side and 0 on the left side.  Foot exam: intact monofilament (except right big toe diminished), vibratory and foot proprioception bilaterally   Skin:   On inspection and palpation, no skin breakdown, ulceration, corns or calluses.  Dry skin on big toes   Psychiatric: He has a normal mood and affect. Judgment normal.         Prior labs: 02/26/12: A1c 6.7, Tchol 116, TG 68, HDL 33, LDL 49, Cr 1.23, nl LFTs, Ca 9.1, Na 139, K 4.1; 03/28/12: 8 AM: plasma frac metanephrines 83 (<205), PRA 15.35 (0.25-5.82), Aldosterone 2 ng/dl- PRA elevated likely on context of HCTZ and ACE inhibitor; salivary cortisols 0.07, 0.13- not concerning for [place].  No concerning evidence for hyperfunctionality.  06/28/14: A1c 8.3, ualb/Cr 71, Hct 44.6, Cr 1.0,  APhos 109, Tchol 162, TG 270, HDL 32, LDL 76, TSH 2.08; 86/57/84: [facility] labs: Cr 1.1, A1c 9.6, nl LFTs, Tchol 119, TG 219, HDL 32, LDL 43, ualb/Cr 12.6; 04/22/16: POC A1c 7.3; 07/10/16: [facility] labs: Cr 1.5, nl LFTs, Ca 8.8, Hct 37.3; 12/23/16: A1c 6.4, Cr 1.3, Ca 9.4, nl LFTs, ualb/Cr 6; 06/08/17: A1c 5.9, Tchol 129, TG 143, HDL 40, LDL 64; 12/13/17: A1c 7.4, ualb/Cr 3, Cr 1.24, Ca 9, nl LFTs; 09/27/18 [facility] labs: Hct 40.2, Tchol 147, TG 222, HDL 33, LDL 77, Cr 1.19, Ca 8.9, A1c 10.4; ADDENDUM: 11/04/18 [facility] labs: A1c 8.3, Cr 1.25, Ca 9, nl LFTs    Prior radiology: [place] CT abd/pelvis 01/2008: Bilateral low density adrenal gland nodules are seen, largest on the right measuring 2 cm. Bilateral low density adrenal gland nodules, probable adenomas. Bilateral nonobstructing renal calculi. 05/2011: [place] 6 CT abd/pelvis adrenal adenomas unchanged.      [facility] 03/24/12: Compared to [facility] imaging  from 01/2008: bilateral adrenal adenomas noted with right side largest at 2 cm (unchanged from 2009) low attenation.        Assessment:       64 y.o. -year-old male with history of type 2 diabetes without known complications.        Plan:       1. Diabetes type 2 (stable): Will check hemoglobin A1c again with labs for next visit- will have done at [facility].  Based on recent A1c and unknown blood sugar pattern of fasting sugars and prandial/post-prandial sugars, will adjust medication/insulin as follows: continue metformin 500 mg bid and weekly Bydureon SC.  Check sugars AM fasting and 2 hours after biggest meal of day with goal fasting/pre-meal range of 80-130; goal postprandial range of 80-160.  Is due for eye exam this summer; will screen for nephropathy and check spot urine microalbumin/Cr and CMP in coming months.  Consider seeing podiatry for foot care in the future.  Continue carbohydrate-controlled diet.  F/U in 2-3 months to reassess.    2. Hypertension (stable): goal blood pressure is <130/80.  Controlled  on regimen of Lisinopril 20 mg, HCTZ 25 mg daily, metoprolol 25 mg bid; states home blood pressures 120s/80s- unable to assess at this time.    3. Hyperlipidemia (stable): controlled on Zocor 20 mg; goal LDL<100.  Continue low cholesterol, low saturated fat diet.      4. Overweight status/Obesity (stable): counseled patient on at least 150 min/wk of moderate-intensity exercise and consider flexibility/strength training exercises if possible.  Physical activity programs should begin slowly and build up gradually; nutritional counseling status.    5. Hypoglycemia management (stable): counseled patient on need to keep sugar source with him at all times and medic-alert bracelet/indicator specifying diagnosis of diabetes    6. Adrenal adenoma (stable): given prior workup was nonrevealing,will defer on further imaging tests given nearly 5 years of surveillance; could consider repeat hormonal evaluation in future if symptoms worsen.        A total of {% [phone]:[id number]} minutes were spent with the patient which include time spent during face-to-face interaction with the patient and non face-to-face time including time spent reviewing and analyzing records, ordering tests, ordering medications, and completing documentation.  This included time spent counseling the patient on proper administration of medication/insulin, other drug therapy options, dietary recommendations, goals of diabetes therapy, and summarizing potential complications of diabetes.        The patient provided verbal consent for today's video visit encounter.

## 2019-06-13 ENCOUNTER — Telehealth (INDEPENDENT_AMBULATORY_CARE_PROVIDER_SITE_OTHER): Admitting: Endocrinology, Diabetes and Metabolism

## 2019-08-01 ENCOUNTER — Other Ambulatory Visit (INDEPENDENT_AMBULATORY_CARE_PROVIDER_SITE_OTHER): Payer: Self-pay | Admitting: Endocrinology, Diabetes and Metabolism

## 2019-10-10 DIAGNOSIS — C642 Malignant neoplasm of left kidney, except renal pelvis: Secondary | ICD-10-CM | POA: Insufficient documentation

## 2019-10-10 DIAGNOSIS — N2889 Other specified disorders of kidney and ureter: Secondary | ICD-10-CM | POA: Insufficient documentation

## 2019-11-20 DIAGNOSIS — N1832 Chronic kidney disease, stage 3b: Secondary | ICD-10-CM | POA: Insufficient documentation

## 2019-11-20 DIAGNOSIS — E1122 Type 2 diabetes mellitus with diabetic chronic kidney disease: Secondary | ICD-10-CM | POA: Insufficient documentation

## 2019-11-20 DIAGNOSIS — Z905 Acquired absence of kidney: Secondary | ICD-10-CM | POA: Insufficient documentation

## 2019-12-20 DIAGNOSIS — L905 Scar conditions and fibrosis of skin: Secondary | ICD-10-CM | POA: Insufficient documentation

## 2019-12-20 DIAGNOSIS — L309 Dermatitis, unspecified: Secondary | ICD-10-CM | POA: Insufficient documentation

## 2019-12-20 DIAGNOSIS — D239 Other benign neoplasm of skin, unspecified: Secondary | ICD-10-CM | POA: Insufficient documentation

## 2019-12-20 DIAGNOSIS — D1801 Hemangioma of skin and subcutaneous tissue: Secondary | ICD-10-CM | POA: Insufficient documentation

## 2020-01-25 DIAGNOSIS — M10372 Gout due to renal impairment, left ankle and foot: Secondary | ICD-10-CM | POA: Insufficient documentation

## 2020-06-19 DIAGNOSIS — I781 Nevus, non-neoplastic: Secondary | ICD-10-CM | POA: Insufficient documentation

## 2020-06-19 DIAGNOSIS — Z28311 Partially vaccinated for covid-19: Secondary | ICD-10-CM | POA: Insufficient documentation

## 2020-10-15 ENCOUNTER — Emergency Department: Admit: 2020-10-15 | Discharge status: Absent without leave | Payer: Self-pay

## 2020-10-17 ENCOUNTER — Encounter: Payer: Self-pay | Admitting: Registered Nurse

## 2020-10-17 ENCOUNTER — Ambulatory Visit (INDEPENDENT_AMBULATORY_CARE_PROVIDER_SITE_OTHER): Admitting: Registered Nurse

## 2020-10-17 ENCOUNTER — Other Ambulatory Visit: Payer: Self-pay

## 2020-10-17 VITALS — BP 138/92 | HR 70 | Temp 98.3°F | Resp 18 | Ht 75.0 in | Wt 295.2 lb

## 2020-10-17 DIAGNOSIS — Z1329 Encounter for screening for other suspected endocrine disorder: Secondary | ICD-10-CM

## 2020-10-17 DIAGNOSIS — N1832 Chronic kidney disease, stage 3b: Secondary | ICD-10-CM

## 2020-10-17 DIAGNOSIS — E559 Vitamin D deficiency, unspecified: Secondary | ICD-10-CM

## 2020-10-17 DIAGNOSIS — Z1211 Encounter for screening for malignant neoplasm of colon: Secondary | ICD-10-CM

## 2020-10-17 DIAGNOSIS — E1169 Type 2 diabetes mellitus with other specified complication: Secondary | ICD-10-CM

## 2020-10-17 DIAGNOSIS — L409 Psoriasis, unspecified: Secondary | ICD-10-CM

## 2020-10-17 DIAGNOSIS — Z13228 Encounter for screening for other metabolic disorders: Secondary | ICD-10-CM

## 2020-10-17 DIAGNOSIS — D631 Anemia in chronic kidney disease: Secondary | ICD-10-CM

## 2020-10-17 DIAGNOSIS — Z85528 Personal history of other malignant neoplasm of kidney: Secondary | ICD-10-CM | POA: Diagnosis not present

## 2020-10-17 DIAGNOSIS — Z23 Encounter for immunization: Secondary | ICD-10-CM

## 2020-10-17 DIAGNOSIS — E1122 Type 2 diabetes mellitus with diabetic chronic kidney disease: Secondary | ICD-10-CM | POA: Diagnosis not present

## 2020-10-17 DIAGNOSIS — E785 Hyperlipidemia, unspecified: Secondary | ICD-10-CM | POA: Diagnosis not present

## 2020-10-17 DIAGNOSIS — Z13220 Encounter for screening for lipoid disorders: Secondary | ICD-10-CM | POA: Diagnosis not present

## 2020-10-17 DIAGNOSIS — Z130 Encounter for screening for diseases of the blood and blood-forming organs and certain disorders involving the immune mechanism: Secondary | ICD-10-CM

## 2020-10-17 LAB — CBC WITH DIFFERENTIAL/PLATELET
Basophils Absolute: 0.1 10*3/uL (ref 0.0–0.1)
Basophils Relative: 1.1 % (ref 0.0–3.0)
Eosinophils Absolute: 0.2 10*3/uL (ref 0.0–0.7)
Eosinophils Relative: 2.6 % (ref 0.0–5.0)
HCT: 38.8 % — ABNORMAL LOW (ref 39.0–52.0)
Hemoglobin: 12.7 g/dL — ABNORMAL LOW (ref 13.0–17.0)
Lymphocytes Relative: 19.1 % (ref 12.0–46.0)
Lymphs Abs: 1.2 10*3/uL (ref 0.7–4.0)
MCHC: 32.6 g/dL (ref 30.0–36.0)
MCV: 83.6 fl (ref 78.0–100.0)
Monocytes Absolute: 0.3 10*3/uL (ref 0.1–1.0)
Monocytes Relative: 4.7 % (ref 3.0–12.0)
Neutro Abs: 4.4 10*3/uL (ref 1.4–7.7)
Neutrophils Relative %: 72.5 % (ref 43.0–77.0)
Platelets: 162 10*3/uL (ref 150.0–400.0)
RBC: 4.64 Mil/uL (ref 4.22–5.81)
RDW: 15.5 % (ref 11.5–15.5)
WBC: 6.1 10*3/uL (ref 4.0–10.5)

## 2020-10-17 LAB — URINALYSIS
Bilirubin Urine: NEGATIVE
Hgb urine dipstick: NEGATIVE
Ketones, ur: NEGATIVE
Leukocytes,Ua: NEGATIVE
Nitrite: NEGATIVE
Specific Gravity, Urine: 1.015 (ref 1.000–1.030)
Total Protein, Urine: NEGATIVE
Urine Glucose: NEGATIVE
Urobilinogen, UA: 0.2 (ref 0.0–1.0)
pH: 5.5 (ref 5.0–8.0)

## 2020-10-17 LAB — COMPREHENSIVE METABOLIC PANEL
ALT: 12 U/L (ref 0–53)
AST: 13 U/L (ref 0–37)
Albumin: 4.4 g/dL (ref 3.5–5.2)
Alkaline Phosphatase: 86 U/L (ref 39–117)
BUN: 22 mg/dL (ref 6–23)
CO2: 23 mEq/L (ref 19–32)
Calcium: 9 mg/dL (ref 8.4–10.5)
Chloride: 108 mEq/L (ref 96–112)
Creatinine, Ser: 1.84 mg/dL — ABNORMAL HIGH (ref 0.40–1.50)
GFR: 38.46 mL/min — ABNORMAL LOW (ref >60.00)
Glucose, Bld: 71 mg/dL (ref 70–99)
Potassium: 3.9 mEq/L (ref 3.5–5.1)
Sodium: 140 mEq/L (ref 135–145)
Total Bilirubin: 0.5 mg/dL (ref 0.2–1.2)
Total Protein: 6.7 g/dL (ref 6.0–8.3)

## 2020-10-17 LAB — HEMOGLOBIN A1C: Hgb A1c MFr Bld: 6.4 % (ref 4.6–6.5)

## 2020-10-17 LAB — TSH: TSH: 1.92 u[IU]/mL (ref 0.35–5.50)

## 2020-10-17 LAB — LIPID PANEL
Cholesterol: 99 mg/dL (ref 0–200)
HDL: 36.5 mg/dL — ABNORMAL LOW (ref >39.00)
LDL Cholesterol: 38 mg/dL (ref 0–99)
NonHDL: 62.99
Total CHOL/HDL Ratio: 3
Triglycerides: 127 mg/dL (ref 0.0–149.0)
VLDL: 25.4 mg/dL (ref 0.0–40.0)

## 2020-10-17 LAB — VITAMIN D 25 HYDROXY (VIT D DEFICIENCY, FRACTURES): VITD: 35.06 ng/mL (ref 30.00–100.00)

## 2020-10-17 MED ORDER — SIMVASTATIN 20 MG PO TABS: 20.0000 mg | ORAL_TABLET | Freq: Every day | ORAL | 3 refills | Status: DC

## 2020-10-17 NOTE — Patient Instructions (Addendum)
Mr. Zaretsky -  Doristine Devoid to meet you.  Labs today will be back tomorrow. I'll be in touch with any concerns  Referrals today to get set up with Nephrology, Ophthalmology, dermatology, and gastroenterology (for colonoscopy). These groups will reach out to you in the coming weeks  I will place order for CT scan abd/pelv to take place hopefully in the next week or so. We can follow up on results as warranted. Labs today will help stratify how concerned we need to be.  Let's plan on 3 mo follow up  Thank you  Rich

## 2020-10-25 DIAGNOSIS — D638 Anemia in other chronic diseases classified elsewhere: Secondary | ICD-10-CM | POA: Insufficient documentation

## 2020-10-27 ENCOUNTER — Encounter: Payer: Self-pay | Admitting: Registered Nurse

## 2020-11-01 NOTE — Progress Notes (Signed)
New Patient Office Visit  Subjective:  Patient ID: Craig Smith, male    DOB: 03/18/1955  Age: 65 y.o. MRN: [id number]  CC:  Chief Complaint  Patient presents with   Transitions Of Care    Patient states he is here for [facility] and discuss stage 3 kidney failure. Patient is now having more back pain and right side pain and he only have that kidney left.    HPI Craig Smith presents for visit to est care.  Histories reviewed and updated with patient.   Concern for hx of ckd -  S/p renal cancer with nephrectomy. Now some flank pain on R side, where remaining kidney is. No urinary changes. No sudden weight changes. No other red flags. Does not have local nephrologist or onc team. Would like to est with both.  Due for colon ca screening Would like referral to GI for colonoscopy.  Otherwise history is fairly unremarkable. S/p bilateral tka, doing well. Psoriasis well controlled.  t2dm Last A1c:  Lab Results  Component Value Date   HGBA1C 6.4 10/17/2020    Currently taking: glipizide '5mg'$  po qd ac No new complications Reports good compliance with medications Diet has been steady Exercise habits have been steady  Hypertension: Patient Currently taking: amlodipine '5mg'$  po qd Good effect. No AEs. Denies CV symptoms including: chest pain, shob, doe, headache, visual changes, fatigue, claudication, and dependent edema.   Previous readings and labs: BP Readings from Last 3 Encounters:  10/17/20 (!) 138/92   Lab Results  Component Value Date   CREATININE 1.84 (H) 10/17/2020    HLD Taking simvastatin '20mg'$  po qd Good effect, no AE Lab Results  Component Value Date   CHOL 99 10/17/2020   HDL 36.50 (L) 10/17/2020   LDLCALC 38 10/17/2020   TRIG 127.0 10/17/2020   CHOLHDL 3 10/17/2020    Psoriasis Needs to est with local derm No acute concerns.    Past Medical History:  Diagnosis Date   Cancer ([place])    Cancer of kidney ([place])    CKD (chronic kidney  disease)    Psoriasis     Past Surgical History:  Procedure Laterality Date   REPLACEMENT TOTAL KNEE BILATERAL      Family History  Problem Relation Age of Onset   Cancer Mother    Cancer Father    Healthy Sister    Kidney failure Brother    Healthy Son     Social History   Socioeconomic History   Marital status: Married    Spouse name: Not on file   Number of children: Not on file   Years of education: Not on file   Highest education level: Not on file  Occupational History   Not on file  Tobacco Use   Smoking status: Never   Smokeless tobacco: Never  Vaping Use   Vaping Use: Never used  Substance and Sexual Activity   Alcohol use: Not Currently   Drug use: Never   Sexual activity: Not Currently  Other Topics Concern   Not on file  Social History Narrative   Not on file   Social Determinants of Health   Financial Resource Strain: Not on file  Food Insecurity: Not on file  Transportation Needs: Not on file  Physical Activity: Not on file  Stress: Not on file  Social Connections: Not on file  Intimate Partner Violence: Not on file    ROS Review of Systems  Constitutional: Negative.   HENT: Negative.  Eyes: Negative.   Respiratory: Negative.    Cardiovascular: Negative.   Gastrointestinal: Negative.   Genitourinary:  Positive for flank pain. Negative for decreased urine volume, difficulty urinating, dysuria, enuresis, frequency, genital sores, hematuria, penile discharge, penile pain, penile swelling, scrotal swelling, testicular pain and urgency.  Skin: Negative.   Neurological: Negative.   Psychiatric/Behavioral: Negative.     Objective:   Today's Vitals: BP (!) 138/92   Pulse 70   Temp 98.3 F (36.8 C) (Temporal)   Resp 18   Ht '6\' 3"'$  (1.905 m)   Wt 295 lb 3.2 oz (133.9 kg)   SpO2 99%   BMI 36.90 kg/m   Physical Exam  Assessment & Plan:   Problem List Items Addressed This Visit   None Visit Diagnoses     History of kidney cancer     -  Primary   Relevant Orders   Ambulatory referral to Nephrology   Ambulatory referral to Hematology / Oncology   Urinalysis (Completed)   Anemia of chronic renal failure, stage 3b ([place])       Relevant Orders   Ambulatory referral to Nephrology   Ambulatory referral to Hematology / Oncology   Urinalysis (Completed)   Need for shingles vaccine       Relevant Orders   Varicella-zoster vaccine IM (Completed)   Urinalysis (Completed)   Colon cancer screening       Relevant Orders   Ambulatory referral to Gastroenterology   Psoriasis       Relevant Orders   Ambulatory referral to Dermatology   Hyperlipidemia associated with type 2 diabetes mellitus ([place])       Relevant Medications   glipiZIDE (GLUCOTROL) 5 MG tablet   simvastatin (ZOCOR) 20 MG tablet   Other Relevant Orders   Comprehensive metabolic panel (Completed)   Lipid panel (Completed)   Vitamin D deficiency       Relevant Orders   Vitamin D (25 hydroxy) (Completed)   Type 2 diabetes mellitus with stage 3b chronic kidney disease, without long-term current use of insulin (HCC)       Relevant Medications   glipiZIDE (GLUCOTROL) 5 MG tablet   simvastatin (ZOCOR) 20 MG tablet   Other Relevant Orders   Comprehensive metabolic panel (Completed)   Hemoglobin A1c (Completed)   Ambulatory referral to Ophthalmology   Screening for endocrine, metabolic and immunity disorder       Relevant Orders   CBC with Differential/Platelet (Completed)   Comprehensive metabolic panel (Completed)   Hemoglobin A1c (Completed)   TSH (Completed)   Lipid screening       Relevant Orders   Lipid panel (Completed)       Outpatient Encounter Medications as of 10/17/2020  Medication Sig   amLODipine (NORVASC) 5 MG tablet Take 5 mg by mouth daily at 12 noon.   Doxycycline Monohydrate 150 MG CAPS Take by mouth.   febuxostat (ULORIC) 40 MG tablet Take 40 mg by mouth daily at 12 noon.   fluticasone (FLONASE) 50 MCG/ACT nasal spray Place 1 spray  into both nostrils daily as needed.   glipiZIDE (GLUCOTROL) 5 MG tablet Take by mouth daily before breakfast.   [DISCONTINUED] amLODipine (NORVASC) 5 MG tablet Take 5 mg by mouth daily.   [DISCONTINUED] simvastatin (ZOCOR) 20 MG tablet Take 20 mg by mouth daily.   [DISCONTINUED] simvastatin (ZOCOR) 20 MG tablet Take 20 mg by mouth daily.   simvastatin (ZOCOR) 20 MG tablet Take 1 tablet (20 mg total) by mouth daily.  No facility-administered encounter medications on file as of 10/17/2020.    Follow-up: Return in about 3 months (around 01/17/2021) for Chronic conditions.   Plan Referrals placed as above Medications refilled Return in 3 mo to check on chronic conditions, further coordinate care. Return sooner with acute concerns Labs collected. Will follow up with the patient as warranted. Patient encouraged to call clinic with any questions, comments, or concerns.  Maximiano Coss, NP

## 2020-12-29 NOTE — Telephone Encounter (Signed)
Notes closed, referrals processed  Thanks,  Rich

## 2021-01-23 ENCOUNTER — Ambulatory Visit (INDEPENDENT_AMBULATORY_CARE_PROVIDER_SITE_OTHER): Admitting: Registered Nurse

## 2021-01-23 ENCOUNTER — Other Ambulatory Visit: Payer: Self-pay

## 2021-01-23 ENCOUNTER — Encounter: Payer: Self-pay | Admitting: Registered Nurse

## 2021-01-23 VITALS — BP 126/81 | HR 81 | Temp 98.3°F | Resp 18 | Ht 75.0 in | Wt 297.8 lb

## 2021-01-23 DIAGNOSIS — N1832 Chronic kidney disease, stage 3b: Secondary | ICD-10-CM

## 2021-01-23 DIAGNOSIS — Z85528 Personal history of other malignant neoplasm of kidney: Secondary | ICD-10-CM

## 2021-01-23 DIAGNOSIS — E1122 Type 2 diabetes mellitus with diabetic chronic kidney disease: Secondary | ICD-10-CM

## 2021-01-23 DIAGNOSIS — L409 Psoriasis, unspecified: Secondary | ICD-10-CM | POA: Diagnosis not present

## 2021-01-23 MED ORDER — GLIPIZIDE 10 MG PO TABS: 5.0000 mg | ORAL_TABLET | Freq: Two times a day (BID) | ORAL | 0 refills | Status: DC

## 2021-01-23 NOTE — Patient Instructions (Addendum)
Mr. Rainford -  Doristine Devoid to see you  Referrals placed  Increase glipizide to 5mg  twice daily before meals  I'll follow charts from specialist visits  Pop back in in around 6 months or so to touch base.  Thank you  Rich     If you have lab work done today you will be contacted with your lab results within the next 2 weeks.  If you have not heard from [place] then please contact us. The fastest way to get your results is to register for My Chart.   IF you received an x-ray today, you will receive an invoice from [facility]. Please contact [facility] at [phone] with questions or concerns regarding your invoice.   IF you received labwork today, you will receive an invoice from [facility]. Please contact [facility] at [phone] with questions or concerns regarding your invoice.   Our billing staff will not be able to assist you with questions regarding bills from these companies.  You will be contacted with the lab results as soon as they are available. The fastest way to get your results is to activate your My Chart account. Instructions are located on the last page of this paperwork. If you have not heard from [place] regarding the results in 2 weeks, please contact this office.

## 2021-01-23 NOTE — Progress Notes (Signed)
Established Patient Office Visit  Subjective:  Patient ID: Craig Smith, male    DOB: 1955-04-17  Age: 65 y.o. MRN: [id number]  CC:  Chief Complaint  Patient presents with   Medication Refill    Patient states he would need a medication refill until he can get in with Endocrinology     HPI Craig Smith presents for med refill  Has upcoming appt with endo - not until end of dec. Needs glipizide. Has been hyperglycemic to 200s  Random glucose today at 290. He is asymptomatic.   Would like to have glipizide adjusted if possible.  Otherwise, notes he needs referrals: Ophthalmology: t2dm eye exam Oncology : hx of kidney cancer Dermatology: hx of psoriasis.  He had these specialists set up in [place], [place], but has not est with new providers since moving.  No acute concerns beyond above noted hyperglycemia.    Past Medical History:  Diagnosis Date   Cancer ([place])    Cancer of kidney ([place])    CKD (chronic kidney disease)    Psoriasis     Past Surgical History:  Procedure Laterality Date   REPLACEMENT TOTAL KNEE BILATERAL      Family History  Problem Relation Age of Onset   Cancer Mother    Cancer Father    Healthy Sister    Kidney failure Brother    Healthy Son     Social History   Socioeconomic History   Marital status: Married    Spouse name: Not on file   Number of children: Not on file   Years of education: Not on file   Highest education level: Not on file  Occupational History   Not on file  Tobacco Use   Smoking status: Never   Smokeless tobacco: Never  Vaping Use   Vaping Use: Never used  Substance and Sexual Activity   Alcohol use: Not Currently   Drug use: Never   Sexual activity: Not Currently  Other Topics Concern   Not on file  Social History Narrative   Not on file   Social Determinants of Health   Financial Resource Strain: Not on file  Food Insecurity: Not on file  Transportation Needs: Not on file   Physical Activity: Not on file  Stress: Not on file  Social Connections: Not on file  Intimate Partner Violence: Not on file    Outpatient Medications Prior to Visit  Medication Sig Dispense Refill   chlorthalidone (HYGROTEN) 15 MG tablet Take 15 mg by mouth once.     Doxycycline Monohydrate 150 MG CAPS Take by mouth.     febuxostat (ULORIC) 40 MG tablet Take 40 mg by mouth daily at 12 noon.     fluticasone (FLONASE) 50 MCG/ACT nasal spray Place 1 spray into both nostrils daily as needed.     simvastatin (ZOCOR) 20 MG tablet Take 1 tablet (20 mg total) by mouth daily. 90 tablet 3   glipiZIDE (GLUCOTROL) 5 MG tablet Take by mouth daily before breakfast.     amLODipine (NORVASC) 5 MG tablet Take 5 mg by mouth daily at 12 noon.     gabapentin (NEURONTIN) 100 MG capsule Take 200 mg by mouth 3 (three) times daily.     No facility-administered medications prior to visit.    No Known Allergies  ROS Review of Systems  Constitutional: Negative.   HENT: Negative.    Eyes: Negative.   Respiratory: Negative.    Cardiovascular: Negative.   Gastrointestinal: Negative.  Genitourinary: Negative.   Musculoskeletal: Negative.   Skin: Negative.   Neurological: Negative.   Psychiatric/Behavioral: Negative.    All other systems reviewed and are negative.    Objective:    Physical Exam Constitutional:      General: He is not in acute distress.    Appearance: Normal appearance. He is normal weight. He is not ill-appearing, toxic-appearing or diaphoretic.  Cardiovascular:     Rate and Rhythm: Normal rate and regular rhythm.     Heart sounds: Normal heart sounds. No murmur heard.   No friction rub. No gallop.  Pulmonary:     Effort: Pulmonary effort is normal. No respiratory distress.     Breath sounds: Normal breath sounds. No stridor. No wheezing, rhonchi or rales.  Chest:     Chest wall: No tenderness.  Skin:    Findings: Lesion (nails pitting and peeling suggestive of psoriasis.)  and rash (psoriasis scalp) present.  Neurological:     General: No focal deficit present.     Mental Status: He is alert and oriented to person, place, and time. Mental status is at baseline.  Psychiatric:        Mood and Affect: Mood normal.        Behavior: Behavior normal.        Thought Content: Thought content normal.        Judgment: Judgment normal.    BP 126/81   Pulse 81   Temp 98.3 F (36.8 C) (Temporal)   Resp 18   Ht 6\' 3"  (1.905 m)   Wt 297 lb 12.8 oz (135.1 kg)   SpO2 99%   BMI 37.22 kg/m  Wt Readings from Last 3 Encounters:  01/23/21 297 lb 12.8 oz (135.1 kg)  10/17/20 295 lb 3.2 oz (133.9 kg)     Health Maintenance Due  Topic Date Due   Pneumococcal Vaccine 47-35 Years old (1 - PCV) Never done   URINE MICROALBUMIN  Never done   COLONOSCOPY (Pts 45-48yrs Insurance coverage will need to be confirmed)  Never done   Zoster Vaccines- Shingrix (2 of 2) 12/12/2020    There are no preventive care reminders to display for this patient.  Lab Results  Component Value Date   TSH 1.92 10/17/2020   Lab Results  Component Value Date   WBC 6.1 10/17/2020   HGB 12.7 (L) 10/17/2020   HCT 38.8 (L) 10/17/2020   MCV 83.6 10/17/2020   PLT 162.0 10/17/2020   Lab Results  Component Value Date   NA 140 10/17/2020   K 3.9 10/17/2020   CO2 23 10/17/2020   GLUCOSE 71 10/17/2020   BUN 22 10/17/2020   CREATININE 1.84 (H) 10/17/2020   BILITOT 0.5 10/17/2020   ALKPHOS 86 10/17/2020   AST 13 10/17/2020   ALT 12 10/17/2020   PROT 6.7 10/17/2020   ALBUMIN 4.4 10/17/2020   CALCIUM 9.0 10/17/2020   GFR 38.46 (L) 10/17/2020   Lab Results  Component Value Date   CHOL 99 10/17/2020   Lab Results  Component Value Date   HDL 36.50 (L) 10/17/2020   Lab Results  Component Value Date   LDLCALC 38 10/17/2020   Lab Results  Component Value Date   TRIG 127.0 10/17/2020   Lab Results  Component Value Date   CHOLHDL 3 10/17/2020   Lab Results  Component Value Date    HGBA1C 6.4 10/17/2020      Assessment & Plan:   Problem List Items Addressed This Visit  None Visit Diagnoses     Type 2 diabetes mellitus with stage 3b chronic kidney disease, without long-term current use of insulin (HCC)    -  Primary   Relevant Medications   glipiZIDE (GLUCOTROL) 10 MG tablet   Other Relevant Orders   Ambulatory referral to Ophthalmology   Psoriasis       Relevant Orders   Ambulatory referral to Dermatology   History of kidney cancer       Relevant Orders   Ambulatory referral to Hematology / Oncology       Meds ordered this encounter  Medications   glipiZIDE (GLUCOTROL) 10 MG tablet    Sig: Take 0.5 tablets (5 mg total) by mouth 2 (two) times daily before a meal.    Dispense:  180 tablet    Refill:  0    Order Specific Question:   Supervising Provider    Answer:   Carlota Raspberry, JEFFREY R [[id number]]    Follow-up: Return in about 6 months (around 07/23/2021) for chronic conditions.   PLAN Referrals placed as above. Given the worsening of psoriasis, will refer ugently to derm.  Increase glipizide to 5mg  po bid. Monitor sugars. Reviewed lifestyle factors affecting blood glucose. Patient encouraged to call clinic with any questions, comments, or concerns.   Maximiano Coss, NP

## 2021-02-13 ENCOUNTER — Inpatient Hospital Stay: Attending: Registered Nurse

## 2021-02-13 ENCOUNTER — Encounter: Payer: Self-pay | Admitting: Hematology & Oncology

## 2021-02-13 ENCOUNTER — Inpatient Hospital Stay (HOSPITAL_BASED_OUTPATIENT_CLINIC_OR_DEPARTMENT_OTHER): Admitting: Hematology & Oncology

## 2021-02-13 ENCOUNTER — Other Ambulatory Visit: Payer: Self-pay

## 2021-02-13 VITALS — BP 139/78 | HR 72 | Temp 98.6°F | Resp 18 | Wt 292.0 lb

## 2021-02-13 DIAGNOSIS — Z809 Family history of malignant neoplasm, unspecified: Secondary | ICD-10-CM | POA: Diagnosis not present

## 2021-02-13 DIAGNOSIS — Z905 Acquired absence of kidney: Secondary | ICD-10-CM | POA: Insufficient documentation

## 2021-02-13 DIAGNOSIS — Z85528 Personal history of other malignant neoplasm of kidney: Secondary | ICD-10-CM

## 2021-02-13 DIAGNOSIS — Z7189 Other specified counseling: Secondary | ICD-10-CM

## 2021-02-13 LAB — CMP (CANCER CENTER ONLY)
ALT: 14 U/L (ref 0–44)
AST: 11 U/L — ABNORMAL LOW (ref 15–41)
Albumin: 4.1 g/dL (ref 3.5–5.0)
Alkaline Phosphatase: 88 U/L (ref 38–126)
Anion gap: 8 (ref 5–15)
BUN: 36 mg/dL — ABNORMAL HIGH (ref 8–23)
CO2: 26 mmol/L (ref 22–32)
Calcium: 9.3 mg/dL (ref 8.9–10.3)
Chloride: 101 mmol/L (ref 98–111)
Creatinine: 2.03 mg/dL — ABNORMAL HIGH (ref 0.61–1.24)
GFR, Estimated: 36 mL/min — ABNORMAL LOW (ref >60)
Glucose, Bld: 333 mg/dL — ABNORMAL HIGH (ref 70–99)
Potassium: 4.4 mmol/L (ref 3.5–5.1)
Sodium: 135 mmol/L (ref 135–145)
Total Bilirubin: 0.5 mg/dL (ref 0.3–1.2)
Total Protein: 6.4 g/dL — ABNORMAL LOW (ref 6.5–8.1)

## 2021-02-13 LAB — CBC WITH DIFFERENTIAL (CANCER CENTER ONLY)
Abs Immature Granulocytes: 0.03 10*3/uL (ref 0.00–0.07)
Basophils Absolute: 0.1 10*3/uL (ref 0.0–0.1)
Basophils Relative: 1 %
Eosinophils Absolute: 0.1 10*3/uL (ref 0.0–0.5)
Eosinophils Relative: 3 %
HCT: 36.8 % — ABNORMAL LOW (ref 39.0–52.0)
Hemoglobin: 12.6 g/dL — ABNORMAL LOW (ref 13.0–17.0)
Immature Granulocytes: 1 %
Lymphocytes Relative: 23 %
Lymphs Abs: 1.2 10*3/uL (ref 0.7–4.0)
MCH: 28.6 pg (ref 26.0–34.0)
MCHC: 34.2 g/dL (ref 30.0–36.0)
MCV: 83.6 fL (ref 80.0–100.0)
Monocytes Absolute: 0.3 10*3/uL (ref 0.1–1.0)
Monocytes Relative: 6 %
Neutro Abs: 3.6 10*3/uL (ref 1.7–7.7)
Neutrophils Relative %: 66 %
Platelet Count: 155 10*3/uL (ref 150–400)
RBC: 4.4 MIL/uL (ref 4.22–5.81)
RDW: 14.2 % (ref 11.5–15.5)
WBC Count: 5.3 10*3/uL (ref 4.0–10.5)
nRBC: 0 % (ref 0.0–0.2)

## 2021-02-13 LAB — LACTATE DEHYDROGENASE: LDH: 131 U/L (ref 98–192)

## 2021-02-13 NOTE — Progress Notes (Signed)
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  HPI: Craig Smith is a very nice 65 year old white male.  He is originally from [place].  He was in the [employer].  He has been in many different places.  He has been down in [place].  He then moved back up to [place].  His history with his malignancy dates back to last year.  He actually apparently was found to have appendicitis.  In the work-up, he was noted to have a mass in the left kidney.  He ultimately underwent a left nephrectomy.  This was in August 2021.  The pathology report ( [id number]) showed a papillary renal cell carcinoma.  It measured 4.1 x 3.3 x 2.5 cm.  Apparently, he was sent up to the [facility] for further analysis.  It seems like this may have been stage I.  He did not require any type of adjuvant therapy.  He has subsequently moved down to [place].  He has grandchildren down here.  He feels okay.  He really has had no problems with abdominal pain.  He has had no problems with nausea or vomiting.  He does have diabetes.  I think he is seeing endocrinology.  He has chronic renal insufficiency because of the diabetes and having only 1 kidney.  We are asked to see him so we can follow-up with the kidney cancer.  He is urinating okay.  He is having no hematuria.  He is going to the bathroom and bowel movements okay.  There has been no problems with rashes.  He has had a little bit of leg swelling which is chronic.  Currently, his performance status is ECOG 1.    Past Medical History:  Diagnosis Date   Cancer ([place])    Cancer of kidney ([place])    CKD (chronic kidney disease)    Psoriasis   :   Past Surgical History:  Procedure Laterality Date   REPLACEMENT TOTAL KNEE BILATERAL    :   Current Outpatient Medications:    clobetasol  (TEMOVATE) 0.05 % external solution, Apply to scaly daily as needed no longer then two weeks at a time, Disp: , Rfl:    nitroGLYCERIN (NITROSTAT) 0.4 MG SL tablet, Place 0.4 mg under the tongue as needed., Disp: , Rfl:    amLODipine (NORVASC) 10 MG tablet, Take 10 mg by mouth daily., Disp: , Rfl:    chlorthalidone (HYGROTEN) 15 MG tablet, Take 15 mg by mouth once., Disp: , Rfl:    doxycycline (ORACEA) 40 MG capsule, Take 40 mg by mouth daily., Disp: , Rfl:    Doxycycline Monohydrate 150 MG CAPS, Take by mouth., Disp: , Rfl:    febuxostat (ULORIC) 40 MG tablet, Take 40 mg by mouth daily at 12 noon., Disp: , Rfl:    fluticasone (FLONASE) 50 MCG/ACT nasal spray, Place 1 spray into both nostrils daily as needed., Disp: , Rfl:    gabapentin (NEURONTIN) 100 MG capsule, Take 200 mg by mouth 3 (three) times daily., Disp: , Rfl:    glipiZIDE (GLUCOTROL XL) 5 MG 24 hr tablet, Take 5 mg by mouth 2 (two) times daily., Disp: , Rfl:    simvastatin (ZOCOR) 20 MG tablet, Take 1 tablet (20 mg total) by mouth daily., Disp: 90 tablet, Rfl: 3:  :  Allergies  Allergen Reactions   Allopurinol Diarrhea, Nausea And Vomiting and Other (See Comments)    Other reaction(s): GI intolerance, GI intolerance, Other (See Comments) Abdominal pain Other reaction(s): Other (See Comments) Abdominal pain Abdominal pain Other reaction(s): Other (See Comments) Abdominal pain Abdominal pain Other reaction(s): Other (See Comments) Abdominal pain Abdominal pain Other reaction(s): Other (See Comments) Abdominal pain Abdominal pain Abdominal pain Other reaction(s): Other (See Comments) Abdominal pain Other reaction(s): GI intolerance, GI intolerance, Other (See Comments) Abdominal pain Other reaction(s): Other (See Comments) Abdominal pain Abdominal pain Other reaction(s): Other (See Comments) Abdominal pain Other reaction(s): Other (See Comments) Abdominal pain Abdominal pain Other reaction(s): Other (See  Comments) Abdominal pain Other reaction(s): Other (See Comments) Abdominal pain Abdominal pain    Nsaids Other (See Comments)   Sulfa Antibiotics Hives  :   Family History  Problem Relation Age of Onset   Cancer Mother    Cancer Father    Healthy Sister    Kidney failure Brother    Healthy Son   :   Social History   Socioeconomic History   Marital status: Married    Spouse name: Not on file   Number of children: Not on file   Years of education: Not on file   Highest education level: Not on file  Occupational History   Not on file  Tobacco Use   Smoking status: Never   Smokeless tobacco: Never  Vaping Use   Vaping Use: Never used  Substance and Sexual Activity   Alcohol use: Not Currently   Drug use: Never   Sexual activity: Not Currently  Other Topics Concern   Not on file  Social History Narrative   Not on file   Social Determinants of Health   Financial Resource Strain: Not on file  Food Insecurity: Not on file  Transportation Needs: Not on file  Physical Activity: Not on file  Stress: Not on file  Social Connections: Not on file  Intimate Partner Violence: Not on file  :  Review of Systems  Constitutional: Negative.   HENT: Negative.    Eyes: Negative.   Respiratory: Negative.    Cardiovascular: Negative.   Gastrointestinal: Negative.   Genitourinary: Negative.   Musculoskeletal: Negative.   Skin: Negative.   Neurological: Negative.   Endo/Heme/Allergies: Negative.   Psychiatric/Behavioral: Negative.      Exam: @IPVITALS @ This is a well-developed well-nourished white male in no obvious distress.  Vital signs are temperature of 98.6.  Pulse of 72.  Blood pressure 139/78.  Weight is 292 pounds.  Head and neck exam shows no scleral icterus.  There is no ocular or oral lesions.  He has no palpable cervical or supraclavicular lymph nodes.  Thyroid is not palpable.  Lungs are clear bilaterally.  Cardiac exam regular rate and rhythm with no  murmurs, rubs or bruits.  Abdomen is soft.  He has in the laparotomy scars.  He is obese.  There is no fluid wave.  There is no palpable abdominal mass.  There is no palpable liver or spleen tip.  Back exam shows no tenderness over the spine, ribs or hips.  Extremities shows no clubbing, cyanosis or edema.  Neurological exam shows no focal neurological deficits.   Recent Labs    02/13/21 1031  WBC 5.3  HGB 12.6*  HCT 36.8*  PLT 155    Recent Labs    02/13/21 1031  NA 135  K 4.4  CL 101  CO2 26  GLUCOSE 333*  BUN 36*  CREATININE 2.03*  CALCIUM 9.3    Blood smear review: None  Pathology: See above    Assessment and Plan: Craig Smith is a very nice 65 year old white male.  He does have multiple medical problems.  He does have the renal cell carcinoma.  This was a papillary renal cell carcinoma.  It is hard to say exactly what stage of this was.  There was a high-grade carcinoma.  I suppose that he certainly is at risk for recurrence.  We will have to follow along with scans.  Will not be able to do any contrast with the scans because of his renal function.  I probably would like to try to do scans when I see him back.  This way we can do everything in 1 day.  This would be reasonable.  It was fun talking to him.  He is very nice.  He served our country.  It was very interesting to talk to him about his service to our country.  We will plan to have him come back in January.  We will try to do everything in 1 day.

## 2021-02-14 ENCOUNTER — Encounter: Payer: Self-pay | Admitting: Hematology & Oncology

## 2021-02-14 DIAGNOSIS — Z7189 Other specified counseling: Secondary | ICD-10-CM | POA: Insufficient documentation

## 2021-02-14 HISTORY — DX: Other specified counseling: Z71.89

## 2021-03-18 ENCOUNTER — Ambulatory Visit (HOSPITAL_BASED_OUTPATIENT_CLINIC_OR_DEPARTMENT_OTHER)
Admission: RE | Admit: 2021-03-18 | Discharge: 2021-03-18 | Disposition: A | Discharge status: Home / Self Care | Source: Ambulatory Visit | Attending: Hematology & Oncology | Admitting: Hematology & Oncology

## 2021-03-18 ENCOUNTER — Inpatient Hospital Stay: Attending: Registered Nurse

## 2021-03-18 ENCOUNTER — Inpatient Hospital Stay (HOSPITAL_BASED_OUTPATIENT_CLINIC_OR_DEPARTMENT_OTHER): Admitting: Hematology & Oncology

## 2021-03-18 ENCOUNTER — Other Ambulatory Visit: Payer: Self-pay

## 2021-03-18 ENCOUNTER — Encounter: Payer: Self-pay | Admitting: Hematology & Oncology

## 2021-03-18 VITALS — BP 121/78 | HR 99 | Temp 98.9°F | Resp 18 | Wt 290.0 lb

## 2021-03-18 DIAGNOSIS — Z85528 Personal history of other malignant neoplasm of kidney: Secondary | ICD-10-CM | POA: Diagnosis present

## 2021-03-18 DIAGNOSIS — C642 Malignant neoplasm of left kidney, except renal pelvis: Secondary | ICD-10-CM | POA: Diagnosis not present

## 2021-03-18 LAB — CMP (CANCER CENTER ONLY)
ALT: 13 U/L (ref 0–44)
AST: 13 U/L — ABNORMAL LOW (ref 15–41)
Albumin: 4.6 g/dL (ref 3.5–5.0)
Alkaline Phosphatase: 100 U/L (ref 38–126)
Anion gap: 7 (ref 5–15)
BUN: 43 mg/dL — ABNORMAL HIGH (ref 8–23)
CO2: 28 mmol/L (ref 22–32)
Calcium: 10.1 mg/dL (ref 8.9–10.3)
Chloride: 102 mmol/L (ref 98–111)
Creatinine: 2.41 mg/dL — ABNORMAL HIGH (ref 0.61–1.24)
GFR, Estimated: 29 mL/min — ABNORMAL LOW (ref >60)
Glucose, Bld: 302 mg/dL — ABNORMAL HIGH (ref 70–99)
Potassium: 5 mmol/L (ref 3.5–5.1)
Sodium: 137 mmol/L (ref 135–145)
Total Bilirubin: 0.5 mg/dL (ref 0.3–1.2)
Total Protein: 7 g/dL (ref 6.5–8.1)

## 2021-03-18 LAB — CBC WITH DIFFERENTIAL (CANCER CENTER ONLY)
Abs Immature Granulocytes: 0.02 10*3/uL (ref 0.00–0.07)
Basophils Absolute: 0.1 10*3/uL (ref 0.0–0.1)
Basophils Relative: 1 %
Eosinophils Absolute: 0.2 10*3/uL (ref 0.0–0.5)
Eosinophils Relative: 3 %
HCT: 41.7 % (ref 39.0–52.0)
Hemoglobin: 13.6 g/dL (ref 13.0–17.0)
Immature Granulocytes: 0 %
Lymphocytes Relative: 13 %
Lymphs Abs: 0.9 10*3/uL (ref 0.7–4.0)
MCH: 28.3 pg (ref 26.0–34.0)
MCHC: 32.6 g/dL (ref 30.0–36.0)
MCV: 86.7 fL (ref 80.0–100.0)
Monocytes Absolute: 0.4 10*3/uL (ref 0.1–1.0)
Monocytes Relative: 5 %
Neutro Abs: 5.3 10*3/uL (ref 1.7–7.7)
Neutrophils Relative %: 78 %
Platelet Count: 163 10*3/uL (ref 150–400)
RBC: 4.81 MIL/uL (ref 4.22–5.81)
RDW: 14 % (ref 11.5–15.5)
WBC Count: 6.9 10*3/uL (ref 4.0–10.5)
nRBC: 0 % (ref 0.0–0.2)

## 2021-03-18 LAB — LACTATE DEHYDROGENASE: LDH: 132 U/L (ref 98–192)

## 2021-03-18 NOTE — Progress Notes (Signed)
Hematology and Oncology Follow Up Visit  Craig Smith [id number] 1955/12/12 66 y.o. 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  This is his second office visit.  We saw him back in early December.  Again he is incredibly interesting.  He lived in [place].  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  We talked about a lot of the same places that we went.  He had a CT scan done today.  There is no evidence of recurrent renal cell carcinoma.  However, there was noted to be an exophytic lesion in the right kidney.  Again I am not sure what this truly indicates and we will have to watch this.  He has had no abdominal pain.  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  He does have chronic renal insufficiency from having 1 kidney.  He has had no problems with bleeding.  He has had no change in bowel or bladder habits.  He has had no cough or shortness of breath.  He has had no rashes.  There is been a little bit of leg swelling.  He did have a nice holiday season with his family.  Overall, I would say his performance status is ECOG 1.  Medications:  Current Outpatient Medications:    amLODipine (NORVASC) 5 MG tablet, Take 5 mg by mouth daily., Disp: , Rfl:    Assure Comfort Lancets 28G MISC, Test blood sugars 2 times daily, Disp: , Rfl:    clobetasol (TEMOVATE) 0.05 % external solution, Apply to scaly daily as needed no longer then two weeks at a time, Disp: , Rfl:    Continuous Blood Gluc Transmit (DEXCOM G6 TRANSMITTER) MISC, Dispense and use as directed, Disp: , Rfl:    doxycycline (VIBRAMYCIN) 100 MG capsule, Take by mouth., Disp: , Rfl:    Dulaglutide (TRULICITY) 3.71 IR/6.7EL SOPN, Inject into the skin., Disp: , Rfl:    ergocalciferol (VITAMIN D2) 1.25 MG  (50000 UT) capsule, , Disp: , Rfl:    febuxostat (ULORIC) 40 MG tablet, Take 40 mg by mouth daily at 12 noon., Disp: , Rfl:    glucose blood (FREESTYLE LITE) test strip, Test blood sugars 2 times daily. E11.65, Disp: , Rfl:    glucose blood (PRECISION QID TEST) test strip, Test blood sugars 2 times daily, Disp: , Rfl:    indomethacin (INDOCIN) 25 MG capsule, , Disp: , Rfl:    JARDIANCE 10 MG TABS tablet, Take 10 mg by mouth daily., Disp: , Rfl:    losartan (COZAAR) 50 MG tablet, , Disp: , Rfl:    metFORMIN (GLUCOPHAGE-XR) 500 MG 24 hr tablet, , Disp: , Rfl:    methylPREDNISolone (MEDROL DOSEPAK) 4 MG TBPK tablet, , Disp: , Rfl:    predniSONE (DELTASONE) 10 MG tablet, , Disp: , Rfl:    simvastatin (ZOCOR) 20 MG tablet, Take 1 tablet (20 mg total) by mouth daily., Disp: 90 tablet, Rfl: 3   amLODipine (NORVASC) 10 MG tablet, Take 10 mg by mouth daily., Disp: , Rfl:    chlorthalidone (HYGROTEN) 15 MG tablet, Take 15 mg by mouth once., Disp: , Rfl:    Continuous Blood Gluc Sensor (DEXCOM G6 SENSOR) MISC, 1 each by Other route every 10 (ten)  days., Disp: , Rfl:    doxycycline (ORACEA) 40 MG capsule, Take 40 mg by mouth daily., Disp: , Rfl:    Doxycycline Monohydrate 150 MG CAPS, Take by mouth., Disp: , Rfl:    fluticasone (FLONASE) 50 MCG/ACT nasal spray, Place 1 spray into both nostrils daily as needed., Disp: , Rfl:    gabapentin (NEURONTIN) 100 MG capsule, Take 200 mg by mouth 3 (three) times daily., Disp: , Rfl:    glipiZIDE (GLUCOTROL XL) 5 MG 24 hr tablet, Take 5 mg by mouth 2 (two) times daily., Disp: , Rfl:    glipiZIDE (GLUCOTROL) 10 MG tablet, Take 5 mg by mouth 2 (two) times daily., Disp: , Rfl:    nitroGLYCERIN (NITROSTAT) 0.4 MG SL tablet, Place 0.4 mg under the tongue as needed. (Patient not taking: Reported on 03/18/2021), Disp: , Rfl:   Allergies:  Allergies  Allergen Reactions   Allopurinol Diarrhea, Nausea And Vomiting and Other (See Comments)    Other reaction(s): GI  intolerance, GI intolerance, Other (See Comments) Abdominal pain Other reaction(s): Other (See Comments) Abdominal pain Abdominal pain Other reaction(s): Other (See Comments) Abdominal pain Abdominal pain Other reaction(s): Other (See Comments) Abdominal pain Abdominal pain Other reaction(s): Other (See Comments) Abdominal pain Abdominal pain Abdominal pain Other reaction(s): Other (See Comments) Abdominal pain Other reaction(s): GI intolerance, GI intolerance, Other (See Comments) Abdominal pain Other reaction(s): Other (See Comments) Abdominal pain Abdominal pain Other reaction(s): Other (See Comments) Abdominal pain Other reaction(s): Other (See Comments) Abdominal pain Abdominal pain Other reaction(s): Other (See Comments) Abdominal pain Other reaction(s): Other (See Comments) Abdominal pain Abdominal pain    Other Hives   Nsaids Other (See Comments)   Sulfa Antibiotics Hives    Past Medical History, Surgical history, Social history, and Family History were reviewed and updated.  Review of Systems: Review of Systems  Constitutional: Negative.   HENT:  Negative.    Eyes: Negative.   Respiratory: Negative.    Cardiovascular: Negative.   Gastrointestinal: Negative.   Endocrine: Negative.   Genitourinary: Negative.    Musculoskeletal: Negative.   Skin: Negative.   Neurological: Negative.   Hematological: Negative.   Psychiatric/Behavioral: Negative.     Physical Exam:  weight is 290 lb (131.5 kg). His oral temperature is 98.9 F (37.2 C). His blood pressure is 121/78 and his pulse is 99. His respiration is 18 and oxygen saturation is 100%.   Wt Readings from Last 3 Encounters:  03/18/21 290 lb (131.5 kg)  02/13/21 292 lb (132.5 kg)  01/23/21 297 lb 12.8 oz (135.1 kg)    Physical Exam Vitals reviewed.  HENT:     Head: Normocephalic and atraumatic.  Eyes:     Pupils: Pupils are equal, round, and reactive to light.  Cardiovascular:     Rate and  Rhythm: Normal rate and regular rhythm.     Heart sounds: Normal heart sounds.  Pulmonary:     Effort: Pulmonary effort is normal.     Breath sounds: Normal breath sounds.  Abdominal:     General: Bowel sounds are normal.     Palpations: Abdomen is soft.     Comments: Abdominal exam shows the laparoscopic scar in the left upper quadrant.  This is well-healed.  There is no fluid wave in the abdomen.  There is no abdominal mass.  There is no palpable liver or spleen tip.  There is no guarding or rebound tenderness.  Musculoskeletal:        General: No tenderness or deformity.  Normal range of motion.     Cervical back: Normal range of motion.  Lymphadenopathy:     Cervical: No cervical adenopathy.  Skin:    General: Skin is warm and dry.     Findings: No erythema or rash.  Neurological:     Mental Status: He is alert and oriented to person, place, and time.  Psychiatric:        Behavior: Behavior normal.        Thought Content: Thought content normal.        Judgment: Judgment normal.     Lab Results  Component Value Date   WBC 6.9 03/18/2021   HGB 13.6 03/18/2021   HCT 41.7 03/18/2021   MCV 86.7 03/18/2021   PLT 163 03/18/2021     Chemistry      Component Value Date/Time   NA 137 03/18/2021 0814   K 5.0 03/18/2021 0814   CL 102 03/18/2021 0814   CO2 28 03/18/2021 0814   BUN 43 (H) 03/18/2021 0814   CREATININE 2.41 (H) 03/18/2021 0814      Component Value Date/Time   CALCIUM 10.1 03/18/2021 0814   ALKPHOS 100 03/18/2021 0814   AST 13 (L) 03/18/2021 0814   ALT 13 03/18/2021 0814   BILITOT 0.5 03/18/2021 0814      Impression and Plan: Craig Smith is a very nice 66 year old white male.  He has a history of a stage I renal cell carcinoma.  This was removed back in August 2021.  I still think we have to follow-up with this.  I would do another scan probably in April or May.  I am not sure what there is exophytic lesion is in the right kidney.  We will have to watch this  closely.  Overall, I think he is doing okay.  He says he has a little bit of neuropathy which I am sure is probably from diabetes.  We will plan to get him back in April.  We will do the CT scan the same day that I see him.   Volanda Napoleon, MD 1/10/20231:42 PM

## 2021-03-20 ENCOUNTER — Other Ambulatory Visit (HOSPITAL_BASED_OUTPATIENT_CLINIC_OR_DEPARTMENT_OTHER)

## 2021-06-25 ENCOUNTER — Other Ambulatory Visit: Payer: Self-pay | Admitting: *Deleted

## 2021-06-25 DIAGNOSIS — C642 Malignant neoplasm of left kidney, except renal pelvis: Secondary | ICD-10-CM

## 2021-06-26 ENCOUNTER — Other Ambulatory Visit (HOSPITAL_BASED_OUTPATIENT_CLINIC_OR_DEPARTMENT_OTHER)

## 2021-06-26 ENCOUNTER — Encounter: Payer: Self-pay | Admitting: Hematology & Oncology

## 2021-06-26 ENCOUNTER — Ambulatory Visit (HOSPITAL_BASED_OUTPATIENT_CLINIC_OR_DEPARTMENT_OTHER)
Admission: RE | Admit: 2021-06-26 | Discharge: 2021-06-26 | Disposition: A | Discharge status: Home / Self Care | Source: Ambulatory Visit | Attending: Hematology & Oncology | Admitting: Hematology & Oncology

## 2021-06-26 ENCOUNTER — Inpatient Hospital Stay: Attending: Hematology & Oncology | Admitting: Hematology & Oncology

## 2021-06-26 ENCOUNTER — Ambulatory Visit: Admitting: Hematology & Oncology

## 2021-06-26 ENCOUNTER — Inpatient Hospital Stay

## 2021-06-26 VITALS — BP 122/82 | HR 80 | Temp 97.7°F | Resp 19 | Wt 279.0 lb

## 2021-06-26 DIAGNOSIS — Z905 Acquired absence of kidney: Secondary | ICD-10-CM | POA: Insufficient documentation

## 2021-06-26 DIAGNOSIS — C642 Malignant neoplasm of left kidney, except renal pelvis: Secondary | ICD-10-CM

## 2021-06-26 DIAGNOSIS — Z79899 Other long term (current) drug therapy: Secondary | ICD-10-CM | POA: Insufficient documentation

## 2021-06-26 DIAGNOSIS — N289 Disorder of kidney and ureter, unspecified: Secondary | ICD-10-CM | POA: Insufficient documentation

## 2021-06-26 DIAGNOSIS — Z85528 Personal history of other malignant neoplasm of kidney: Secondary | ICD-10-CM | POA: Insufficient documentation

## 2021-06-26 DIAGNOSIS — E119 Type 2 diabetes mellitus without complications: Secondary | ICD-10-CM | POA: Insufficient documentation

## 2021-06-26 LAB — CBC WITH DIFFERENTIAL (CANCER CENTER ONLY)
Abs Immature Granulocytes: 0.01 10*3/uL (ref 0.00–0.07)
Basophils Absolute: 0.1 10*3/uL (ref 0.0–0.1)
Basophils Relative: 1 %
Eosinophils Absolute: 0.2 10*3/uL (ref 0.0–0.5)
Eosinophils Relative: 3 %
HCT: 43.6 % (ref 39.0–52.0)
Hemoglobin: 14.5 g/dL (ref 13.0–17.0)
Immature Granulocytes: 0 %
Lymphocytes Relative: 15 %
Lymphs Abs: 1.2 10*3/uL (ref 0.7–4.0)
MCH: 28.4 pg (ref 26.0–34.0)
MCHC: 33.3 g/dL (ref 30.0–36.0)
MCV: 85.5 fL (ref 80.0–100.0)
Monocytes Absolute: 0.4 10*3/uL (ref 0.1–1.0)
Monocytes Relative: 5 %
Neutro Abs: 6.2 10*3/uL (ref 1.7–7.7)
Neutrophils Relative %: 76 %
Platelet Count: 156 10*3/uL (ref 150–400)
RBC: 5.1 MIL/uL (ref 4.22–5.81)
RDW: 14 % (ref 11.5–15.5)
WBC Count: 8.1 10*3/uL (ref 4.0–10.5)
nRBC: 0 % (ref 0.0–0.2)

## 2021-06-26 LAB — COMPREHENSIVE METABOLIC PANEL
ALT: 13 U/L (ref 0–44)
AST: 11 U/L — ABNORMAL LOW (ref 15–41)
Albumin: 4.5 g/dL (ref 3.5–5.0)
Alkaline Phosphatase: 90 U/L (ref 38–126)
Anion gap: 11 (ref 5–15)
BUN: 42 mg/dL — ABNORMAL HIGH (ref 8–23)
CO2: 28 mmol/L (ref 22–32)
Calcium: 9.8 mg/dL (ref 8.9–10.3)
Chloride: 98 mmol/L (ref 98–111)
Creatinine, Ser: 2.17 mg/dL — ABNORMAL HIGH (ref 0.61–1.24)
GFR, Estimated: 33 mL/min — ABNORMAL LOW (ref >60)
Glucose, Bld: 221 mg/dL — ABNORMAL HIGH (ref 70–99)
Potassium: 4.1 mmol/L (ref 3.5–5.1)
Sodium: 137 mmol/L (ref 135–145)
Total Bilirubin: 0.6 mg/dL (ref 0.3–1.2)
Total Protein: 7.5 g/dL (ref 6.5–8.1)

## 2021-06-26 LAB — LACTATE DEHYDROGENASE: LDH: 134 U/L (ref 98–192)

## 2021-06-26 NOTE — Progress Notes (Signed)
?Hematology and Oncology Follow Up Visit ? ?Craig Smith ?[id number] ?[birth year] 66 y.o. ?06/26/2021 ? ? 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 ?Current Therapy:   ?Status post left nephrectomy in August 2021 ?    ?Interim History:  Mr. Ingle is back for follow-up.  So far, he is doing pretty well.  He has had no problems outside of that with his diabetes.  He sees his endocrinologist on Monday. ? ?He did have a CT scan done today.  The CT scan did not show any evidence of recurrent kidney cancer.  He has a stable 1.4 cm exophytic lesion in the upper pole the right kidney which is again, stable. ? ?He has had no problems with cough or shortness of breath.  He has had no problems with rashes.  There is been no leg swelling. ? ?He has a new job.  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 ? ?He has had no fever.  He has had no problems with COVID. ? ?There is been no change in bowel or bladder habits. ? ?He has had no bleeding. ? ?Overall, I would say his performance status is probably ECOG 1.   ? ?Medications:  ?Current Outpatient Medications:  ?  amLODipine (NORVASC) 10 MG tablet, Take 10 mg by mouth daily., Disp: , Rfl:  ?  Assure Comfort Lancets 28G MISC, Test blood sugars 2 times daily, Disp: , Rfl:  ?  chlorthalidone (HYGROTEN) 15 MG tablet, Take 15 mg by mouth once., Disp: , Rfl:  ?  clobetasol (TEMOVATE) 0.05 % external solution, Apply to scaly daily as needed no longer then two weeks at a time, Disp: , Rfl:  ?  Continuous Blood Gluc Sensor (DEXCOM G6 SENSOR) MISC, 1 each by Other route every 10 (ten) days., Disp: , Rfl:  ?  Continuous Blood Gluc Transmit (DEXCOM G6 TRANSMITTER) MISC, Dispense and use as directed, Disp: , Rfl:  ?  doxycycline (ORACEA) 40 MG capsule, Take 40 mg by mouth daily., Disp: , Rfl:  ?  Dulaglutide (TRULICITY) 2.63 ZC/5.8IF SOPN, Inject into the skin., Disp: , Rfl:  ?  febuxostat (ULORIC) 40 MG tablet, Take 40 mg  by mouth daily at 12 noon., Disp: , Rfl:  ?  fluticasone (FLONASE) 50 MCG/ACT nasal spray, Place 1 spray into both nostrils daily as needed., Disp: , Rfl:  ?  glucose blood (FREESTYLE LITE) test strip, Test blood sugars 2 times daily. E11.65, Disp: , Rfl:  ?  glucose blood (PRECISION QID TEST) test strip, Test blood sugars 2 times daily, Disp: , Rfl:  ?  JARDIANCE 10 MG TABS tablet, Take 10 mg by mouth daily., Disp: , Rfl:  ?  losartan (COZAAR) 50 MG tablet, , Disp: , Rfl:  ?  nitroGLYCERIN (NITROSTAT) 0.4 MG SL tablet, Place 0.4 mg under the tongue as needed. (Patient not taking: Reported on 03/18/2021), Disp: , Rfl:  ?  simvastatin (ZOCOR) 20 MG tablet, Take 1 tablet (20 mg total) by mouth daily., Disp: 90 tablet, Rfl: 3 ? ?Allergies:  ?Allergies  ?Allergen Reactions  ? Allopurinol Diarrhea, Nausea And Vomiting and Other (See Comments)  ?  Other reaction(s): GI intolerance, GI intolerance, Other (See Comments) ?Abdominal pain ?Other reaction(s): Other (See Comments) ?Abdominal pain ?Abdominal pain ?Other reaction(s): Other (See Comments) ?Abdominal pain ?Abdominal pain ?Other reaction(s): Other (See Comments) ?Abdominal pain ?Abdominal pain ?Other reaction(s): Other (See Comments) ?Abdominal pain ?Abdominal pain ?Abdominal pain ?  Other reaction(s): Other (See Comments) ?Abdominal pain ?Other reaction(s): GI intolerance, GI intolerance, Other (See Comments) ?Abdominal pain ?Other reaction(s): Other (See Comments) ?Abdominal pain ?Abdominal pain ?Other reaction(s): Other (See Comments) ?Abdominal pain ?Other reaction(s): Other (See Comments) ?Abdominal pain ?Abdominal pain ?Other reaction(s): Other (See Comments) ?Abdominal pain ?Other reaction(s): Other (See Comments) ?Abdominal pain ?Abdominal pain ? ?Other reaction(s): GI intolerance, GI intolerance, Other (See Comments) ?Abdominal pain ?Other reaction(s): Other (See Comments) ?Abdominal pain ?Abdominal pain ?Other reaction(s): Other (See Comments) ?Abdominal  pain ?Abdominal pain ?Other reaction(s): Other (See Comments) ?Abdominal pain ?Abdominal pain ?Other reaction(s): Other (See Comments) ?Abdominal pain ?Abdominal pain ?Abdominal pain ?Other reaction(s): Other (See Comments) ?Abdominal pain ?Other reaction(s): GI intolerance, GI intolerance, Other (See Comments) ?Abdominal pain ?Other reaction(s): Other (See Comments) ?Abdominal pain ?Abdominal pain ?Other reaction(s): Other (See Comments) ?Abdominal pain ?Other reaction(s): Other (See Comments) ?Abdominal pain ?Abdominal pain ?Other reaction(s): Other (See Comments) ?Abdominal pain ?Other reaction(s): Other (See Comments) ?Abdominal pain ?Abdominal pain ?Other reaction(s): GI intolerance, GI intolerance, Other (See Comments) ?Abdominal pain ?Other reaction(s): Other (See Comments) ?Abdominal pain ?Abdominal pain ?Other reaction(s): Other (See Comments) ?Abdominal pain ?Abdominal pain ?Other reaction(s): Other (See Comments) ?Abdominal pain ?Other reaction(s): Other (See Comments) ?Abdominal pain ?Abdominal pain ?Abdominal pain ?Other reaction(s): Other (See Comments) ?Abdominal pain ?Other reaction(s): Other (See Comments) ?Abdominal pain ?Abdominal pain ?Abdominal pain ?Abdominal pain ?Other reaction(s): Other (See Comments) ?Abdominal pain ?Other reaction(s): GI intolerance, GI intolerance, Other (See Comments) ?Abdominal pain ?Other reaction(s): Other (See Comments) ?Abdominal pain ?Abdominal pain ?Other reaction(s): Other (See Comments) ?Abdominal pain ?Other reaction(s): Other (See Comments) ?Abdominal pain  ? Other Hives  ? Nsaids Other (See Comments)  ? Sulfa Antibiotics Hives  ? ? ?Past Medical History, Surgical history, Social history, and Family History were reviewed and updated. ? ?Review of Systems: ?Review of Systems  ?Constitutional: Negative.   ?HENT:  Negative.    ?Eyes: Negative.   ?Respiratory: Negative.    ?Cardiovascular: Negative.   ?Gastrointestinal: Negative.   ?Endocrine: Negative.    ?Genitourinary: Negative.    ?Musculoskeletal: Negative.   ?Skin: Negative.   ?Neurological: Negative.   ?Hematological: Negative.   ?Psychiatric/Behavioral: Negative.    ? ?Physical Exam: ? weight is 279 lb (126.6 kg). His oral temperature is 97.7 ?F (36.5 ?C). His blood pressure is 122/82 and his pulse is 80. His respiration is 19 and oxygen saturation is 100%.  ? ?Wt Readings from Last 3 Encounters:  ?06/26/21 279 lb (126.6 kg)  ?03/18/21 290 lb (131.5 kg)  ?02/13/21 292 lb (132.5 kg)  ? ? ?Physical Exam ?Vitals reviewed.  ?HENT:  ?   Head: Normocephalic and atraumatic.  ?Eyes:  ?   Pupils: Pupils are equal, round, and reactive to light.  ?Cardiovascular:  ?   Rate and Rhythm: Normal rate and regular rhythm.  ?   Heart sounds: Normal heart sounds.  ?Pulmonary:  ?   Effort: Pulmonary effort is normal.  ?   Breath sounds: Normal breath sounds.  ?Abdominal:  ?   General: Bowel sounds are normal.  ?   Palpations: Abdomen is soft.  ?   Comments: Abdominal exam shows the laparoscopic scar in the left upper quadrant.  This is well-healed.  There is no fluid wave in the abdomen.  There is no abdominal mass.  There is no palpable liver or spleen tip.  There is no guarding or rebound tenderness.  ?Musculoskeletal:     ?   General: No tenderness or deformity. Normal range  of motion.  ?   Cervical back: Normal range of motion.  ?Lymphadenopathy:  ?   Cervical: No cervical adenopathy.  ?Skin: ?   General: Skin is warm and dry.  ?   Findings: No erythema or rash.  ?Neurological:  ?   Mental Status: He is alert and oriented to person, place, and time.  ?Psychiatric:     ?   Behavior: Behavior normal.     ?   Thought Content: Thought content normal.     ?   Judgment: Judgment normal.  ? ? ? ?Lab Results  ?Component Value Date  ? WBC 8.1 06/26/2021  ? HGB 14.5 06/26/2021  ? HCT 43.6 06/26/2021  ? MCV 85.5 06/26/2021  ? PLT 156 06/26/2021  ? ?  Chemistry   ?   ?Component Value Date/Time  ? NA 137 06/26/2021 0910  ? K 4.1  06/26/2021 0910  ? CL 98 06/26/2021 0910  ? CO2 28 06/26/2021 0910  ? BUN 42 (H) 06/26/2021 0910  ? CREATININE 2.17 (H) 06/26/2021 0910  ? CREATININE 2.41 (H) 03/18/2021 3825  ?    ?Component Value Date/Time  ? CALCIUM 9.8

## 2021-11-20 ENCOUNTER — Inpatient Hospital Stay (HOSPITAL_BASED_OUTPATIENT_CLINIC_OR_DEPARTMENT_OTHER): Admitting: Hematology & Oncology

## 2021-11-20 ENCOUNTER — Inpatient Hospital Stay: Attending: Hematology & Oncology

## 2021-11-20 ENCOUNTER — Other Ambulatory Visit: Payer: Self-pay

## 2021-11-20 ENCOUNTER — Encounter: Payer: Self-pay | Admitting: Hematology & Oncology

## 2021-11-20 ENCOUNTER — Ambulatory Visit (HOSPITAL_BASED_OUTPATIENT_CLINIC_OR_DEPARTMENT_OTHER)
Admission: RE | Admit: 2021-11-20 | Discharge: 2021-11-20 | Disposition: A | Discharge status: Home / Self Care | Source: Ambulatory Visit | Attending: Hematology & Oncology | Admitting: Hematology & Oncology

## 2021-11-20 VITALS — BP 106/52 | HR 75 | Temp 97.9°F | Resp 18 | Ht 75.2 in | Wt 272.0 lb

## 2021-11-20 DIAGNOSIS — N289 Disorder of kidney and ureter, unspecified: Secondary | ICD-10-CM | POA: Insufficient documentation

## 2021-11-20 DIAGNOSIS — C642 Malignant neoplasm of left kidney, except renal pelvis: Secondary | ICD-10-CM

## 2021-11-20 DIAGNOSIS — Z85528 Personal history of other malignant neoplasm of kidney: Secondary | ICD-10-CM | POA: Diagnosis not present

## 2021-11-20 LAB — CBC WITH DIFFERENTIAL (CANCER CENTER ONLY)
Abs Immature Granulocytes: 0.02 10*3/uL (ref 0.00–0.07)
Basophils Absolute: 0.1 10*3/uL (ref 0.0–0.1)
Basophils Relative: 1 %
Eosinophils Absolute: 0.2 10*3/uL (ref 0.0–0.5)
Eosinophils Relative: 3 %
HCT: 46.3 % (ref 39.0–52.0)
Hemoglobin: 15.3 g/dL (ref 13.0–17.0)
Immature Granulocytes: 0 %
Lymphocytes Relative: 23 %
Lymphs Abs: 1.4 10*3/uL (ref 0.7–4.0)
MCH: 28.6 pg (ref 26.0–34.0)
MCHC: 33 g/dL (ref 30.0–36.0)
MCV: 86.5 fL (ref 80.0–100.0)
Monocytes Absolute: 0.3 10*3/uL (ref 0.1–1.0)
Monocytes Relative: 5 %
Neutro Abs: 4.1 10*3/uL (ref 1.7–7.7)
Neutrophils Relative %: 68 %
Platelet Count: 170 10*3/uL (ref 150–400)
RBC: 5.35 MIL/uL (ref 4.22–5.81)
RDW: 13.9 % (ref 11.5–15.5)
WBC Count: 6 10*3/uL (ref 4.0–10.5)
nRBC: 0 % (ref 0.0–0.2)

## 2021-11-20 LAB — CMP (CANCER CENTER ONLY)
ALT: 15 U/L (ref 0–44)
AST: 14 U/L — ABNORMAL LOW (ref 15–41)
Albumin: 4.8 g/dL (ref 3.5–5.0)
Alkaline Phosphatase: 70 U/L (ref 38–126)
Anion gap: 7 (ref 5–15)
BUN: 38 mg/dL — ABNORMAL HIGH (ref 8–23)
CO2: 31 mmol/L (ref 22–32)
Calcium: 10.2 mg/dL (ref 8.9–10.3)
Chloride: 101 mmol/L (ref 98–111)
Creatinine: 2.16 mg/dL — ABNORMAL HIGH (ref 0.61–1.24)
GFR, Estimated: 33 mL/min — ABNORMAL LOW (ref >60)
Glucose, Bld: 125 mg/dL — ABNORMAL HIGH (ref 70–99)
Potassium: 4 mmol/L (ref 3.5–5.1)
Sodium: 139 mmol/L (ref 135–145)
Total Bilirubin: 0.5 mg/dL (ref 0.3–1.2)
Total Protein: 7.7 g/dL (ref 6.5–8.1)

## 2021-11-20 LAB — LACTATE DEHYDROGENASE: LDH: 128 U/L (ref 98–192)

## 2021-11-20 NOTE — Progress Notes (Signed)
Hematology and Oncology Follow Up Visit  [name] [id number] 02/11/1956 66 y.o. 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 Craig Smith is back for follow-up.  We last saw him back in April.  Since then, he has been doing pretty well.  He is still working part-time.  He is enjoying this.  His wife is working full-time.  She is done quite a bit of traveling.  He did have a CT scan done today.  Everything looked fine.  There is no evidence of recurrent kidney cancer.  He still has the stable 1.4 cm lesion in the upper pole of the right kidney.  He is try to lose some weight.  He is trying to watch blood sugars.  He has had no problems with fever.  He has had no cough or shortness of breath.  He has had no bleeding.  There is been no change in bowel or bladder habits.  He has had no leg swelling.  He has had no rashes.  Has had no headache.  Overall, I would say his performance status is ECOG 0.     Medications:  Current Outpatient Medications:    amLODipine (NORVASC) 10 MG tablet, Take 10 mg by mouth daily., Disp: , Rfl:    chlorthalidone (HYGROTEN) 15 MG tablet, Take 15 mg by mouth once., Disp: , Rfl:    clobetasol (TEMOVATE) 0.05 % external solution, Apply to scaly daily as needed no longer then two weeks at a time, Disp: , Rfl:    doxycycline (ORACEA) 40 MG capsule, Take 40 mg by mouth daily., Disp: , Rfl:    Dulaglutide (TRULICITY) 8.41 LK/4.4WN SOPN, Inject into the skin., Disp: , Rfl:    febuxostat (ULORIC) 40 MG tablet, Take 40 mg by mouth daily at 12 noon., Disp: , Rfl:    fluticasone (FLONASE) 50 MCG/ACT nasal spray, Place 1 spray into both nostrils daily as needed., Disp: , Rfl:    glipiZIDE (GLUCOTROL) 5 MG tablet, Take by mouth., Disp: , Rfl:    JARDIANCE 10 MG TABS tablet, Take 10 mg by mouth daily., Disp: , Rfl:    losartan  (COZAAR) 50 MG tablet, , Disp: , Rfl:    meclizine (ANTIVERT) 12.5 MG tablet, Take 12.5 mg by mouth 3 (three) times daily., Disp: , Rfl:    ondansetron (ZOFRAN) 8 MG tablet, Take by mouth., Disp: , Rfl:    simvastatin (ZOCOR) 20 MG tablet, Take 1 tablet (20 mg total) by mouth daily., Disp: 90 tablet, Rfl: 3   triamcinolone (NASACORT) 55 MCG/ACT AERO nasal inhaler, two sprays by Both Nostrils route daily., Disp: , Rfl:    nitroGLYCERIN (NITROSTAT) 0.4 MG SL tablet, Place 0.4 mg under the tongue as needed. (Patient not taking: Reported on 03/18/2021), Disp: , Rfl:   Allergies:  Allergies  Allergen Reactions   Allopurinol Diarrhea, Nausea And Vomiting and Other (See Comments)   Other Hives   Sulfa Antibiotics Hives   Nsaids Other (See Comments)    Past Medical History, Surgical history, Social history, and Family History were reviewed and updated.  Review of Systems: Review of Systems  Constitutional: Negative.   HENT:  Negative.    Eyes: Negative.   Respiratory: Negative.    Cardiovascular: Negative.   Gastrointestinal: Negative.   Endocrine: Negative.   Genitourinary: Negative.  Musculoskeletal: Negative.   Skin: Negative.   Neurological: Negative.   Hematological: Negative.   Psychiatric/Behavioral: Negative.      Physical Exam:  height is 6' 3.2" (1.91 m) and weight is 272 lb (123.4 kg). His oral temperature is 97.9 F (36.6 C). His blood pressure is 106/52 (abnormal) and his pulse is 75. His respiration is 18 and oxygen saturation is 100%.   Wt Readings from Last 3 Encounters:  11/20/21 272 lb (123.4 kg)  06/26/21 279 lb (126.6 kg)  03/18/21 290 lb (131.5 kg)    Physical Exam Vitals reviewed.  HENT:     Head: Normocephalic and atraumatic.  Eyes:     Pupils: Pupils are equal, round, and reactive to light.  Cardiovascular:     Rate and Rhythm: Normal rate and regular rhythm.     Heart sounds: Normal heart sounds.  Pulmonary:     Effort: Pulmonary effort is  normal.     Breath sounds: Normal breath sounds.  Abdominal:     General: Bowel sounds are normal.     Palpations: Abdomen is soft.     Comments: Abdominal exam shows the laparoscopic scar in the left upper quadrant.  This is well-healed.  There is no fluid wave in the abdomen.  There is no abdominal mass.  There is no palpable liver or spleen tip.  There is no guarding or rebound tenderness.  Musculoskeletal:        General: No tenderness or deformity. Normal range of motion.     Cervical back: Normal range of motion.  Lymphadenopathy:     Cervical: No cervical adenopathy.  Skin:    General: Skin is warm and dry.     Findings: No erythema or rash.  Neurological:     Mental Status: He is alert and oriented to person, place, and time.  Psychiatric:        Behavior: Behavior normal.        Thought Content: Thought content normal.        Judgment: Judgment normal.     Lab Results  Component Value Date   WBC 6.0 11/20/2021   HGB 15.3 11/20/2021   HCT 46.3 11/20/2021   MCV 86.5 11/20/2021   PLT 170 11/20/2021     Chemistry      Component Value Date/Time   NA 139 11/20/2021 0911   K 4.0 11/20/2021 0911   CL 101 11/20/2021 0911   CO2 31 11/20/2021 0911   BUN 38 (H) 11/20/2021 0911   CREATININE 2.16 (H) 11/20/2021 0911      Component Value Date/Time   CALCIUM 10.2 11/20/2021 0911   ALKPHOS 70 11/20/2021 0911   AST 14 (L) 11/20/2021 0911   ALT 15 11/20/2021 0911   BILITOT 0.5 11/20/2021 0911      Impression and Plan: Mr. [name] is a very nice 66 year old white male.  He has a history of a stage I renal cell carcinoma.  This was removed back in August 2021.  I am glad that the CT scan was unremarkable.    It has now been 2 years since he had his surgery.  I think that we still have to follow him along.  I think we probably do another scan on him in 6 months.  I think if this scan does not show any change in this lesion in the right kidney, then we can probably hold on any  further scans.  I am glad that his quality of life is doing well.  He certainly enjoys working part-time.  It sounds like he is working for a very good company.     Volanda Napoleon, MD 9/14/202310:58 AM

## 2022-05-21 ENCOUNTER — Inpatient Hospital Stay: Payer: Medicare PPO | Admitting: Hematology & Oncology

## 2022-05-21 ENCOUNTER — Ambulatory Visit (HOSPITAL_BASED_OUTPATIENT_CLINIC_OR_DEPARTMENT_OTHER)
Admission: RE | Admit: 2022-05-21 | Discharge: 2022-05-21 | Disposition: A | Discharge status: Home / Self Care | Payer: Medicare PPO | Source: Ambulatory Visit | Attending: Hematology & Oncology | Admitting: Hematology & Oncology

## 2022-05-21 ENCOUNTER — Inpatient Hospital Stay: Payer: Medicare PPO | Attending: Hematology & Oncology

## 2022-05-21 ENCOUNTER — Encounter: Payer: Self-pay | Admitting: Hematology & Oncology

## 2022-05-21 VITALS — BP 129/77 | HR 68 | Temp 97.8°F | Resp 17 | Wt 270.1 lb

## 2022-05-21 DIAGNOSIS — C642 Malignant neoplasm of left kidney, except renal pelvis: Secondary | ICD-10-CM | POA: Insufficient documentation

## 2022-05-21 DIAGNOSIS — Z905 Acquired absence of kidney: Secondary | ICD-10-CM | POA: Insufficient documentation

## 2022-05-21 DIAGNOSIS — Z85528 Personal history of other malignant neoplasm of kidney: Secondary | ICD-10-CM | POA: Insufficient documentation

## 2022-05-21 LAB — CBC WITH DIFFERENTIAL (CANCER CENTER ONLY)
Abs Immature Granulocytes: 0.01 10*3/uL (ref 0.00–0.07)
Basophils Absolute: 0.1 10*3/uL (ref 0.0–0.1)
Basophils Relative: 1 %
Eosinophils Absolute: 0.2 10*3/uL (ref 0.0–0.5)
Eosinophils Relative: 3 %
HCT: 45.9 % (ref 39.0–52.0)
Hemoglobin: 15.6 g/dL (ref 13.0–17.0)
Immature Granulocytes: 0 %
Lymphocytes Relative: 19 %
Lymphs Abs: 1.2 10*3/uL (ref 0.7–4.0)
MCH: 29.7 pg (ref 26.0–34.0)
MCHC: 34 g/dL (ref 30.0–36.0)
MCV: 87.4 fL (ref 80.0–100.0)
Monocytes Absolute: 0.4 10*3/uL (ref 0.1–1.0)
Monocytes Relative: 6 %
Neutro Abs: 4.5 10*3/uL (ref 1.7–7.7)
Neutrophils Relative %: 71 %
Platelet Count: 146 10*3/uL — ABNORMAL LOW (ref 150–400)
RBC: 5.25 MIL/uL (ref 4.22–5.81)
RDW: 13.7 % (ref 11.5–15.5)
WBC Count: 6.4 10*3/uL (ref 4.0–10.5)
nRBC: 0 % (ref 0.0–0.2)

## 2022-05-21 LAB — CMP (CANCER CENTER ONLY)
ALT: 14 U/L (ref 0–44)
AST: 15 U/L (ref 15–41)
Albumin: 4.8 g/dL (ref 3.5–5.0)
Alkaline Phosphatase: 88 U/L (ref 38–126)
Anion gap: 10 (ref 5–15)
BUN: 37 mg/dL — ABNORMAL HIGH (ref 8–23)
CO2: 30 mmol/L (ref 22–32)
Calcium: 10.4 mg/dL — ABNORMAL HIGH (ref 8.9–10.3)
Chloride: 101 mmol/L (ref 98–111)
Creatinine: 2.28 mg/dL — ABNORMAL HIGH (ref 0.61–1.24)
GFR, Estimated: 31 mL/min — ABNORMAL LOW (ref >60)
Glucose, Bld: 157 mg/dL — ABNORMAL HIGH (ref 70–99)
Potassium: 4.3 mmol/L (ref 3.5–5.1)
Sodium: 141 mmol/L (ref 135–145)
Total Bilirubin: 0.5 mg/dL (ref 0.3–1.2)
Total Protein: 7.5 g/dL (ref 6.5–8.1)

## 2022-05-21 LAB — LACTATE DEHYDROGENASE: LDH: 125 U/L (ref 98–192)

## 2022-05-21 NOTE — Progress Notes (Signed)
Hematology and Oncology Follow Up Visit  Craig Smith [id number] April 15, 1955 67 y.o. 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  We last saw him back in September.  Since then, he has been doing pretty well.  He does have new job.  He works for a [employer].  He is quite busy.  We did go ahead and do CAT scans on him today.  The CAT scan did not show any evidence of recurrent kidney cancer.  He has had no problems with nausea or vomiting.  There is no abdominal pain.  He does have diabetes.  His weight is holding steady.  He has had no bleeding.  There is no change in bowel or bladder habits.  He has had no leg swelling.  There is been no rashes.  Currently, I would say his performance status is probably ECOG 0.      Medications:  Current Outpatient Medications:    amLODipine (NORVASC) 10 MG tablet, Take 10 mg by mouth daily., Disp: , Rfl:    chlorthalidone (HYGROTEN) 15 MG tablet, Take 15 mg by mouth once., Disp: , Rfl:    clobetasol (TEMOVATE) 0.05 % external solution, Apply to scaly daily as needed no longer then two weeks at a time, Disp: , Rfl:    doxycycline (ORACEA) 40 MG capsule, Take 40 mg by mouth daily., Disp: , Rfl:    Dulaglutide (TRULICITY) [id number] SOPN, Inject into the skin., Disp: , Rfl:    febuxostat (ULORIC) 40 MG tablet, Take 40 mg by mouth daily at 12 noon., Disp: , Rfl:    fluticasone (FLONASE) 50 MCG/ACT nasal spray, Place 1 spray into both nostrils daily as needed., Disp: , Rfl:    glipiZIDE (GLUCOTROL) 5 MG tablet, Take by mouth., Disp: , Rfl:    JARDIANCE 10 MG TABS tablet, Take 10 mg by mouth daily., Disp: , Rfl:    losartan (COZAAR) 50 MG tablet, , Disp: , Rfl:    meclizine (ANTIVERT) 12.5 MG tablet, Take 12.5 mg by mouth 3 (three) times daily., Disp: , Rfl:     nitroGLYCERIN (NITROSTAT) 0.4 MG SL tablet, Place 0.4 mg under the tongue as needed., Disp: , Rfl:    ondansetron (ZOFRAN) 8 MG tablet, Take by mouth., Disp: , Rfl:    simvastatin (ZOCOR) 20 MG tablet, Take 1 tablet (20 mg total) by mouth daily., Disp: 90 tablet, Rfl: 3   triamcinolone (NASACORT) 55 MCG/ACT AERO nasal inhaler, two sprays by Both Nostrils route daily., Disp: , Rfl:   Allergies:  Allergies  Allergen Reactions   Allopurinol Diarrhea, Nausea And Vomiting and Other (See Comments)   Other Hives   Sulfa Antibiotics Hives   Nsaids Other (See Comments)    Past Medical History, Surgical history, Social history, and Family History were reviewed and updated.  Review of Systems: Review of Systems  Constitutional: Negative.   HENT:  Negative.    Eyes: Negative.   Respiratory: Negative.    Cardiovascular: Negative.   Gastrointestinal: Negative.   Endocrine: Negative.   Genitourinary: Negative.    Musculoskeletal: Negative.   Skin: Negative.   Neurological: Negative.   Hematological: Negative.   Psychiatric/Behavioral: Negative.      Physical Exam:  weight is 270 lb 1.3 oz (122.5 kg). His oral temperature is 97.8  F (36.6 C). His blood pressure is 129/77 and his pulse is 68. His respiration is 17 and oxygen saturation is 100%.   Wt Readings from Last 3 Encounters:  05/21/22 270 lb 1.3 oz (122.5 kg)  11/20/21 272 lb (123.4 kg)  06/26/21 279 lb (126.6 kg)    Physical Exam Vitals reviewed.  HENT:     Head: Normocephalic and atraumatic.  Eyes:     Pupils: Pupils are equal, round, and reactive to light.  Cardiovascular:     Rate and Rhythm: Normal rate and regular rhythm.     Heart sounds: Normal heart sounds.  Pulmonary:     Effort: Pulmonary effort is normal.     Breath sounds: Normal breath sounds.  Abdominal:     General: Bowel sounds are normal.     Palpations: Abdomen is soft.     Comments: Abdominal exam shows the laparoscopic scar in the left upper  quadrant.  This is well-healed.  There is no fluid wave in the abdomen.  There is no abdominal mass.  There is no palpable liver or spleen tip.  There is no guarding or rebound tenderness.  Musculoskeletal:        General: No tenderness or deformity. Normal range of motion.     Cervical back: Normal range of motion.  Lymphadenopathy:     Cervical: No cervical adenopathy.  Skin:    General: Skin is warm and dry.     Findings: No erythema or rash.  Neurological:     Mental Status: He is alert and oriented to person, place, and time.  Psychiatric:        Behavior: Behavior normal.        Thought Content: Thought content normal.        Judgment: Judgment normal.     Lab Results  Component Value Date   WBC 6.4 05/21/2022   HGB 15.6 05/21/2022   HCT 45.9 05/21/2022   MCV 87.4 05/21/2022   PLT 146 (L) 05/21/2022     Chemistry      Component Value Date/Time   NA 141 05/21/2022 0902   K 4.3 05/21/2022 0902   CL 101 05/21/2022 0902   CO2 30 05/21/2022 0902   BUN 37 (H) 05/21/2022 0902   CREATININE 2.28 (H) 05/21/2022 0902      Component Value Date/Time   CALCIUM 10.4 (H) 05/21/2022 0902   ALKPHOS 88 05/21/2022 0902   AST 15 05/21/2022 0902   ALT 14 05/21/2022 0902   BILITOT 0.5 05/21/2022 0902      Impression and Plan: Craig Smith is a very nice 67 year old white male.  He has a history of a stage I renal cell carcinoma.  This was removed back in August 2021.  Everything looks fantastic.  I really think that we can move his appointments to 8 months.  We will get a CT scan sooner that we see him.  I do believe that he is going be cured from his kidney cancer.  However, knowing the history of kidney cancer, this can certainly come back at any time.    Volanda Napoleon, MD 3/14/202410:53 AM

## 2023-01-21 ENCOUNTER — Inpatient Hospital Stay (HOSPITAL_BASED_OUTPATIENT_CLINIC_OR_DEPARTMENT_OTHER): Payer: Medicare PPO | Admitting: Hematology & Oncology

## 2023-01-21 ENCOUNTER — Inpatient Hospital Stay: Payer: Medicare PPO | Attending: Hematology & Oncology

## 2023-01-21 ENCOUNTER — Ambulatory Visit (HOSPITAL_BASED_OUTPATIENT_CLINIC_OR_DEPARTMENT_OTHER)
Admission: RE | Admit: 2023-01-21 | Discharge: 2023-01-21 | Disposition: A | Discharge status: Home / Self Care | Payer: Medicare PPO | Source: Ambulatory Visit | Attending: Hematology & Oncology | Admitting: Hematology & Oncology

## 2023-01-21 ENCOUNTER — Encounter: Payer: Self-pay | Admitting: Hematology & Oncology

## 2023-01-21 VITALS — BP 113/69 | HR 72 | Temp 97.5°F | Resp 20 | Ht 75.0 in | Wt 264.0 lb

## 2023-01-21 DIAGNOSIS — Z85528 Personal history of other malignant neoplasm of kidney: Secondary | ICD-10-CM | POA: Diagnosis present

## 2023-01-21 DIAGNOSIS — Z905 Acquired absence of kidney: Secondary | ICD-10-CM | POA: Insufficient documentation

## 2023-01-21 DIAGNOSIS — C642 Malignant neoplasm of left kidney, except renal pelvis: Secondary | ICD-10-CM

## 2023-01-21 DIAGNOSIS — Z08 Encounter for follow-up examination after completed treatment for malignant neoplasm: Secondary | ICD-10-CM | POA: Diagnosis present

## 2023-01-21 LAB — CMP (CANCER CENTER ONLY)
ALT: 11 U/L (ref 0–44)
AST: 11 U/L — ABNORMAL LOW (ref 15–41)
Albumin: 5 g/dL (ref 3.5–5.0)
Alkaline Phosphatase: 93 U/L (ref 38–126)
Anion gap: 10 (ref 5–15)
BUN: 40 mg/dL — ABNORMAL HIGH (ref 8–23)
CO2: 31 mmol/L (ref 22–32)
Calcium: 10.3 mg/dL (ref 8.9–10.3)
Chloride: 99 mmol/L (ref 98–111)
Creatinine: 2.41 mg/dL — ABNORMAL HIGH (ref 0.61–1.24)
GFR, Estimated: 29 mL/min — ABNORMAL LOW (ref >60)
Glucose, Bld: 145 mg/dL — ABNORMAL HIGH (ref 70–99)
Potassium: 3.6 mmol/L (ref 3.5–5.1)
Sodium: 140 mmol/L (ref 135–145)
Total Bilirubin: 0.7 mg/dL (ref <1.2)
Total Protein: 7.6 g/dL (ref 6.5–8.1)

## 2023-01-21 LAB — CBC WITH DIFFERENTIAL (CANCER CENTER ONLY)
Abs Immature Granulocytes: 0.03 10*3/uL (ref 0.00–0.07)
Basophils Absolute: 0.1 10*3/uL (ref 0.0–0.1)
Basophils Relative: 1 %
Eosinophils Absolute: 0.2 10*3/uL (ref 0.0–0.5)
Eosinophils Relative: 3 %
HCT: 45.3 % (ref 39.0–52.0)
Hemoglobin: 15.5 g/dL (ref 13.0–17.0)
Immature Granulocytes: 0 %
Lymphocytes Relative: 18 %
Lymphs Abs: 1.3 10*3/uL (ref 0.7–4.0)
MCH: 29.4 pg (ref 26.0–34.0)
MCHC: 34.2 g/dL (ref 30.0–36.0)
MCV: 85.8 fL (ref 80.0–100.0)
Monocytes Absolute: 0.4 10*3/uL (ref 0.1–1.0)
Monocytes Relative: 6 %
Neutro Abs: 4.9 10*3/uL (ref 1.7–7.7)
Neutrophils Relative %: 72 %
Platelet Count: 147 10*3/uL — ABNORMAL LOW (ref 150–400)
RBC: 5.28 MIL/uL (ref 4.22–5.81)
RDW: 13.4 % (ref 11.5–15.5)
WBC Count: 6.9 10*3/uL (ref 4.0–10.5)
nRBC: 0 % (ref 0.0–0.2)

## 2023-01-21 LAB — LACTATE DEHYDROGENASE: LDH: 139 U/L (ref 98–192)

## 2023-01-21 NOTE — Progress Notes (Signed)
Hematology and Oncology Follow Up Visit  Craig Smith [id number] Apr 24, 1955 67 y.o. 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  We last saw him back in March.  Since then, he has been doing pretty well.  He does have diabetes.  He has some kind of cardiac issue now.  He has to see a cardiologist next month.  It sounds like he will have a nice Thanksgiving and Christmas.  It sounds like family will be coming down.  Unfortunately, we do not have the results back from his CT scan.  He has had no problems with hematuria.  There is been no problems with bowels or bladder.  He has had no problems with cough or shortness of breath.  There has been no issues with COVID.  Again he does have the diabetes.  He is trying to watch his levels.  He has had no problems working.  I think he is probably working part-time.  It sounds like his son is doing incredibly well.  His son lives up in [place].  He also served in the [employer].  Overall, I would have to say that his performance status is probably ECOG 1.     Medications:  Current Outpatient Medications:    acetaminophen (TYLENOL) 650 MG CR tablet, Take 1,300 mg by mouth daily as needed., Disp: , Rfl:    amLODipine (NORVASC) 10 MG tablet, Take 10 mg by mouth daily., Disp: , Rfl:    aspirin EC 81 MG tablet, Take 81 mg by mouth daily., Disp: , Rfl:    atorvastatin (LIPITOR) 40 MG tablet, Take 40 mg by mouth daily., Disp: , Rfl:    chlorthalidone (HYGROTEN) 15 MG tablet, Take 15 mg by mouth daily., Disp: , Rfl:    Dulaglutide (TRULICITY) 4.5 MG/0.5ML SOPN, Weekly., Disp: , Rfl:    febuxostat (ULORIC) 40 MG tablet, Take 40 mg by mouth daily at 12 noon., Disp: , Rfl:    FREESTYLE LITE test strip, 1 each as needed., Disp: , Rfl:    glipiZIDE (GLUCOTROL) 5 MG  tablet, Take by mouth daily before breakfast., Disp: , Rfl:    JARDIANCE 10 MG TABS tablet, Take 10 mg by mouth daily., Disp: , Rfl:    triamcinolone (NASACORT) 55 MCG/ACT AERO nasal inhaler, two sprays by Both Nostrils route daily., Disp: , Rfl:    VITAMIN D PO, Take 2,000 Units by mouth daily., Disp: , Rfl:    fluticasone (FLONASE) 50 MCG/ACT nasal spray, Place 1 spray into both nostrils daily as needed. (Patient not taking: Reported on 01/21/2023), Disp: , Rfl:    nitroGLYCERIN (NITROSTAT) 0.4 MG SL tablet, Place 0.4 mg under the tongue as needed. (Patient not taking: Reported on 01/21/2023), Disp: , Rfl:   Allergies:  Allergies  Allergen Reactions   Allopurinol Diarrhea, Nausea And Vomiting and Other (See Comments)   Other Hives   Sulfa Antibiotics Hives   Nsaids Other (See Comments)    Past Medical History, Surgical history, Social history, and Family History were reviewed and updated.  Review of Systems: Review of Systems  Constitutional: Negative.   HENT:  Negative.    Eyes: Negative.   Respiratory: Negative.    Cardiovascular: Negative.   Gastrointestinal: Negative.   Endocrine: Negative.   Genitourinary: Negative.    Musculoskeletal:  Negative.   Skin: Negative.   Neurological: Negative.   Hematological: Negative.   Psychiatric/Behavioral: Negative.      Physical Exam:  height is 6\' 3"  (1.905 m) and weight is 264 lb 0.6 oz (119.8 kg). His oral temperature is 97.5 F (36.4 C) (abnormal). His blood pressure is 113/69 and his pulse is 72. His respiration is 20 and oxygen saturation is 100%.   Wt Readings from Last 3 Encounters:  01/21/23 264 lb 0.6 oz (119.8 kg)  05/21/22 270 lb 1.3 oz (122.5 kg)  11/20/21 272 lb (123.4 kg)    Physical Exam Vitals reviewed.  HENT:     Head: Normocephalic and atraumatic.  Eyes:     Pupils: Pupils are equal, round, and reactive to light.  Cardiovascular:     Rate and Rhythm: Normal rate and regular rhythm.     Heart sounds:  Normal heart sounds.  Pulmonary:     Effort: Pulmonary effort is normal.     Breath sounds: Normal breath sounds.  Abdominal:     General: Bowel sounds are normal.     Palpations: Abdomen is soft.     Comments: Abdominal exam shows the laparoscopic scar in the left upper quadrant.  This is well-healed.  There is no fluid wave in the abdomen.  There is no abdominal mass.  There is no palpable liver or spleen tip.  There is no guarding or rebound tenderness.  Musculoskeletal:        General: No tenderness or deformity. Normal range of motion.     Cervical back: Normal range of motion.  Lymphadenopathy:     Cervical: No cervical adenopathy.  Skin:    General: Skin is warm and dry.     Findings: No erythema or rash.  Neurological:     Mental Status: He is alert and oriented to person, place, and time.  Psychiatric:        Behavior: Behavior normal.        Thought Content: Thought content normal.        Judgment: Judgment normal.      Lab Results  Component Value Date   WBC 6.9 01/21/2023   HGB 15.5 01/21/2023   HCT 45.3 01/21/2023   MCV 85.8 01/21/2023   PLT 147 (L) 01/21/2023     Chemistry      Component Value Date/Time   NA 140 01/21/2023 0922   K 3.6 01/21/2023 0922   CL 99 01/21/2023 0922   CO2 31 01/21/2023 0922   BUN 40 (H) 01/21/2023 0922   CREATININE 2.41 (H) 01/21/2023 0922      Component Value Date/Time   CALCIUM 10.3 01/21/2023 0922   ALKPHOS 93 01/21/2023 0922   AST 11 (L) 01/21/2023 0922   ALT 11 01/21/2023 0922   BILITOT 0.7 01/21/2023 0922      Impression and Plan: Craig Smith is a very nice 67 year old white male.  He has a history of a stage I renal cell carcinoma.  This was removed back in August 2021.  We will have to see what the CT scan shows.  We will plan to get him back in another 6 months.  We will do another set of scans when we see him back.  I know that he will have a wonderful Holiday season.   Josph Macho, MD 11/14/202410:28  AM

## 2023-06-11 ENCOUNTER — Telehealth (HOSPITAL_BASED_OUTPATIENT_CLINIC_OR_DEPARTMENT_OTHER): Payer: Self-pay

## 2023-06-25 ENCOUNTER — Ambulatory Visit (HOSPITAL_BASED_OUTPATIENT_CLINIC_OR_DEPARTMENT_OTHER)
Admission: RE | Admit: 2023-06-25 | Discharge: 2023-06-25 | Disposition: A | Discharge status: Home / Self Care | Source: Ambulatory Visit | Attending: Hematology & Oncology | Admitting: Hematology & Oncology

## 2023-06-25 DIAGNOSIS — C642 Malignant neoplasm of left kidney, except renal pelvis: Secondary | ICD-10-CM | POA: Insufficient documentation

## 2023-06-25 IMAGING — CT CT CHEST-ABD-PELV W/O CM
2 of 4 series · 14 of 36 positions shown, 16 images · non-contrast
Comparison: None.

CLINICAL DATA: Status post left renal cell carcinoma in 9791,
status post resection. Per patient, history of "spots on lungs".

EXAM:
CT CHEST, ABDOMEN AND PELVIS WITHOUT CONTRAST
TECHNIQUE: Multidetector CT imaging of the chest, abdomen and pelvis was
performed following the standard protocol without IV contrast.

[Series 2: axial st · axial · 0.98mm/px · z∈[-721,-61]mm · 11 of 156 slices shown, 13 images]
[im 12/156  mediastinal]
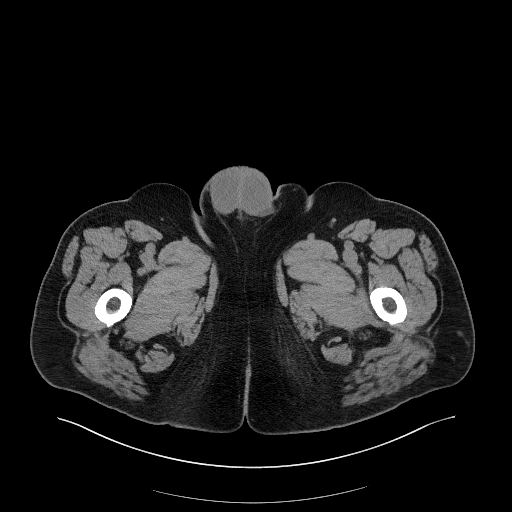
[im 12/156  bone]
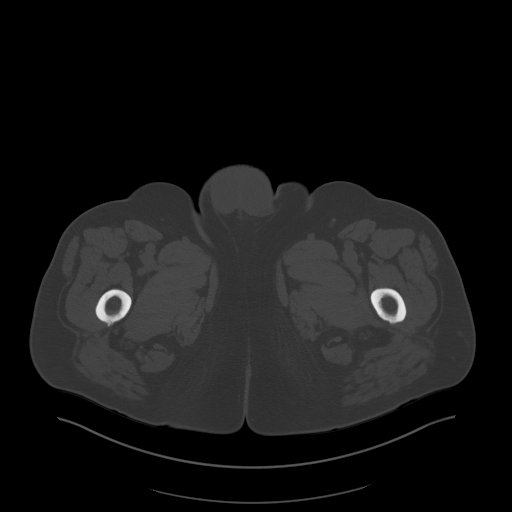
[im 24/156  mediastinal]
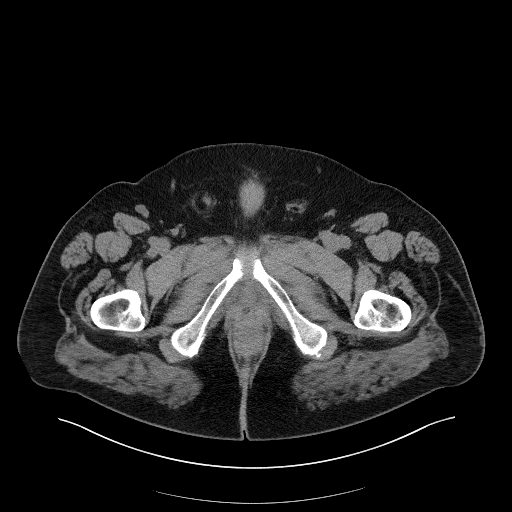
[im 36/156  mediastinal]
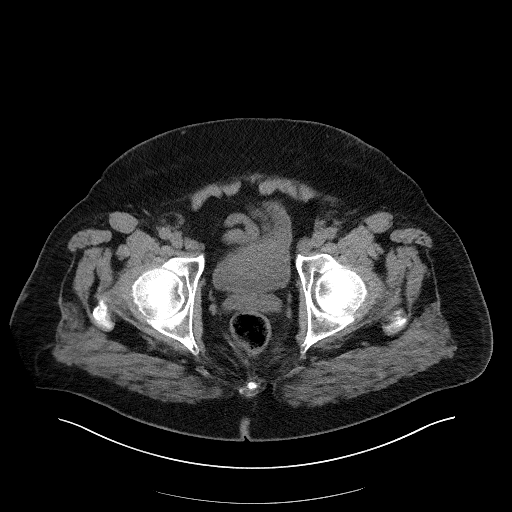
[im 48/156  mediastinal]
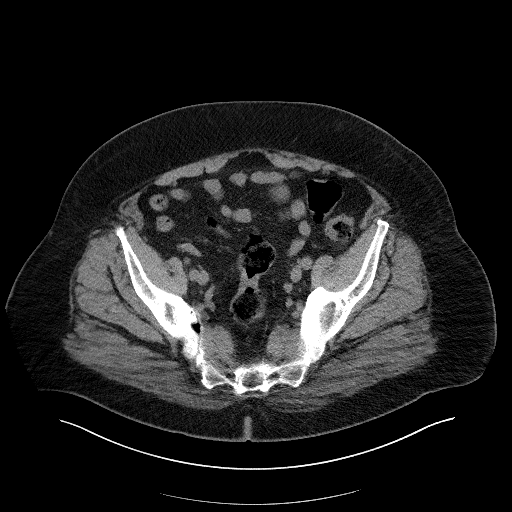
[im 60/156  mediastinal]
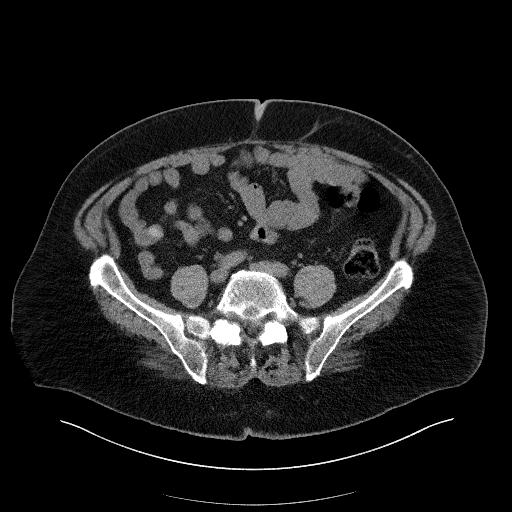
[im 84/156  mediastinal]
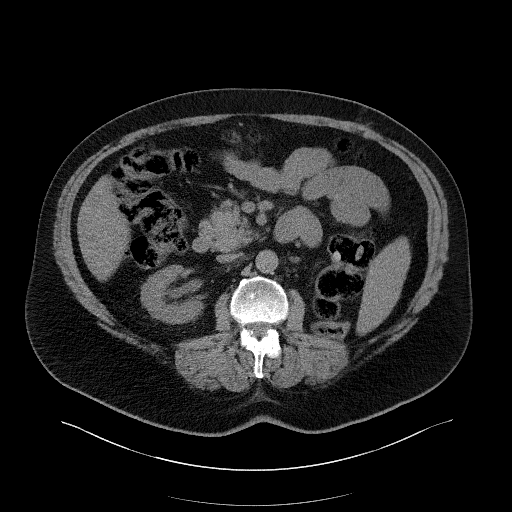
[im 96/156  mediastinal]
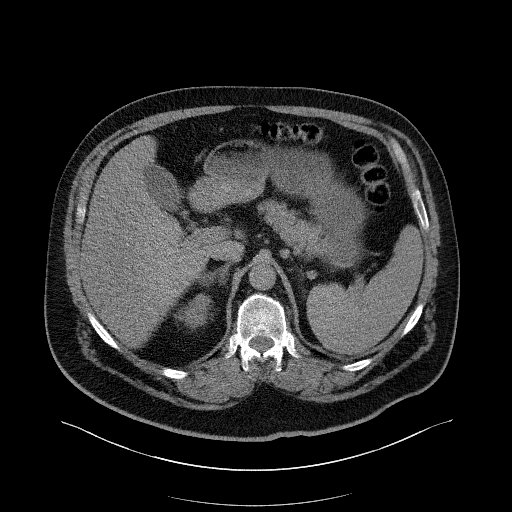
[im 108/156  mediastinal]
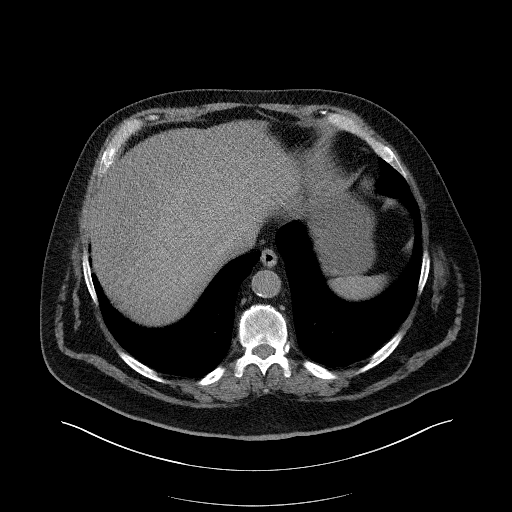
[im 120/156  mediastinal]
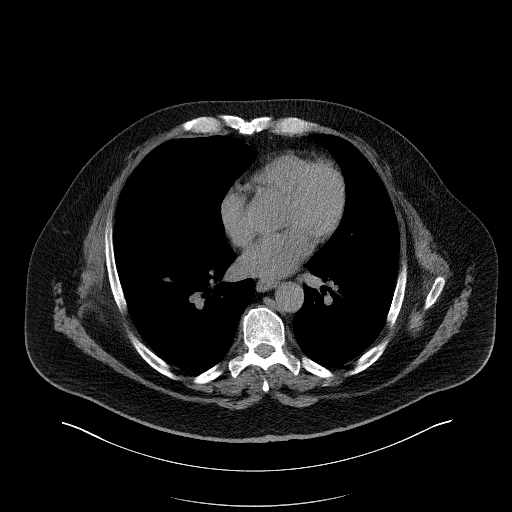
[im 120/156  bone]
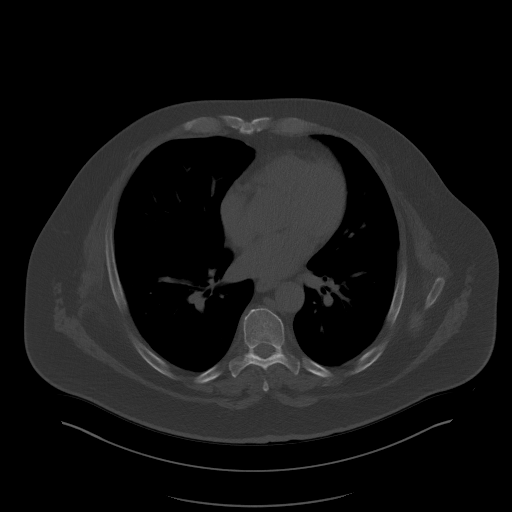
[im 132/156  mediastinal]
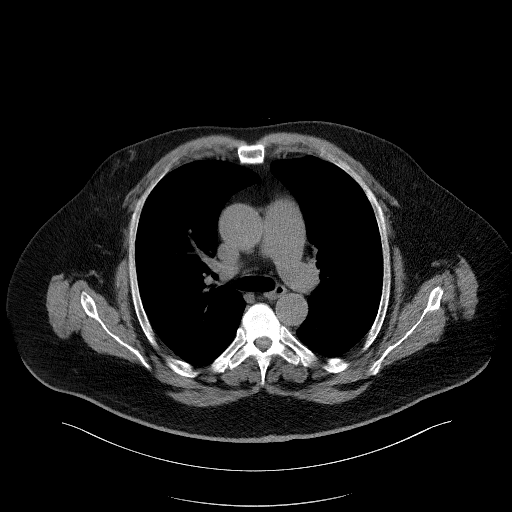
[im 144/156  mediastinal]
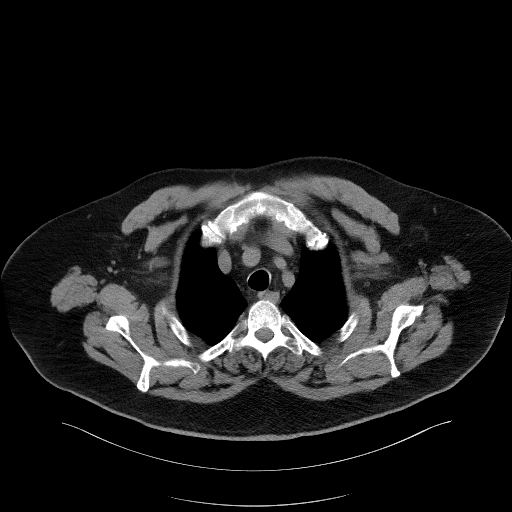

[Series 5: coronal · coronal · 0.95mm/px · 3 of 182 slices shown]
[im 37/182  mediastinal]
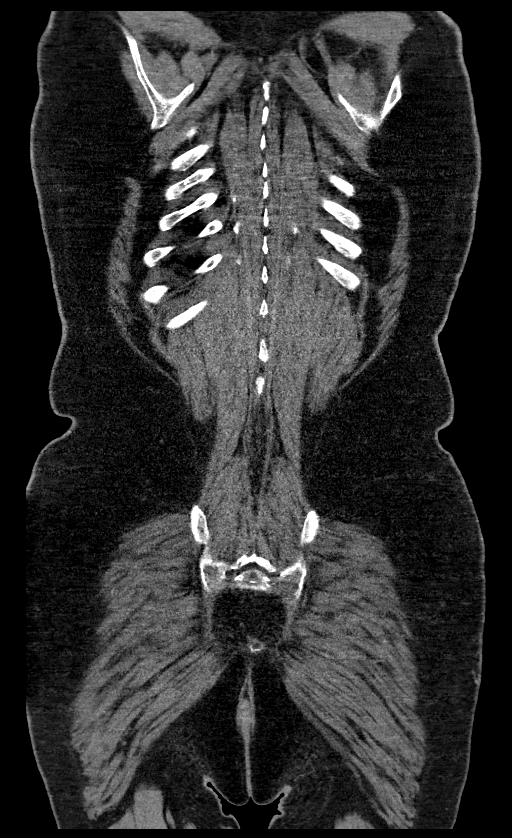
[im 73/182  mediastinal]
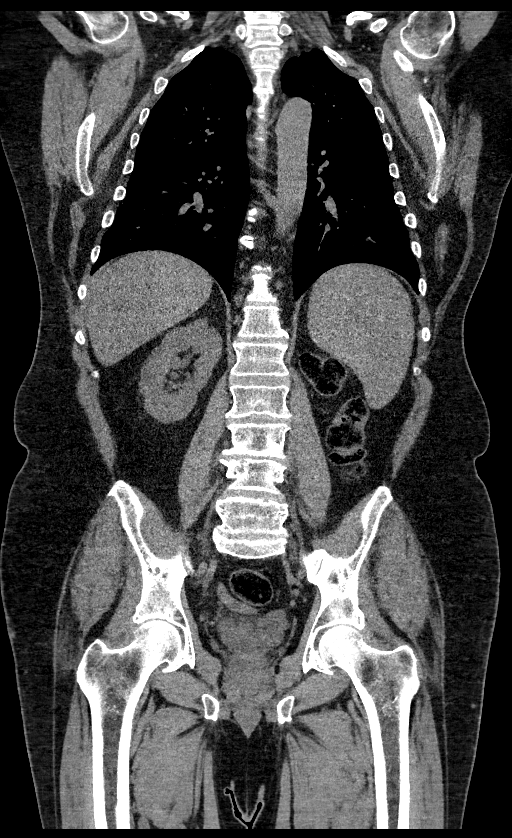
[im 109/182  mediastinal]
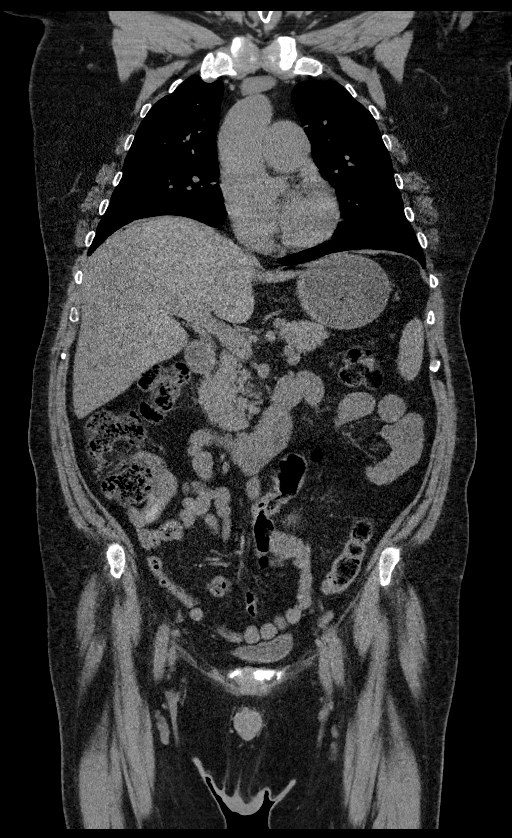

[14 of 36 positions shown; findings below may reference images not displayed]

FINDINGS: CT CHEST FINDINGS

Cardiovascular: Aortic atherosclerosis. Ascending aortic dilatation
is mild, including at 4.2 cm on [DATE] and 4.3 cm on coronal image
111. Tortuous thoracic aorta. Normal heart size, without pericardial
effusion.

Pulmonary artery enlargement, outflow tract 3.9 cm

Mediastinum/Nodes: Posterior left thyroid peripherally calcified
nodule of 1.3 cm. Not clinically significant; no follow-up imaging
recommended (ref: [HOSPITAL]. [DATE]): 143-50)

No supraclavicular adenopathy. No mediastinal or definite hilar
adenopathy, given limitations of unenhanced CT.

Lungs/Pleura: No pleural fluid. Tiny bilateral pulmonary nodules.
Example at 2 mm in the right middle lobe on 87/4, 1-2 mm in the
right lower lobe on 90/4, 2 mm in the right lower lobe on 93/4 and 1
mm in the posterior left upper lobe on 94/4. The right lower lobe
nodules may be calcified.

Musculoskeletal: No acute osseous abnormality.

CT ABDOMEN PELVIS FINDINGS

Hepatobiliary: Mild hepatic steatosis, without focal liver lesion.
Normal gallbladder, without biliary ductal dilatation.

Pancreas: Normal, without mass or ductal dilatation.

Spleen: Borderline splenomegaly, 14.0 cm craniocaudal.

Adrenals/Urinary Tract: Bilateral low-density adrenal nodularity,
consistent with multiple adenomas. Example at up to 2.2 cm on the
left and 2.7 cm on the right.

Left nephrectomy, without local recurrence.

No right renal calculi, hydronephrosis. An upper pole exophytic
cm right renal lesion on 60/2 and 76 coronal measures fluid density
on axial images but slightly greater than fluid density on coronal
images. No bladder calculi.

Stomach/Bowel: Normal stomach, without wall thickening. Normal colon
and terminal ileum. Appendectomy. Normal small bowel.

Vascular/Lymphatic: Aortic atherosclerosis. No abdominopelvic
adenopathy.

Reproductive: Normal prostate.

Other: No significant free fluid. Tiny fat containing right inguinal
hernia.

Musculoskeletal: Right ischial bone island. A vague lucent lesion
within the left iliac of 1.2 cm is favored to represent a
degenerative cyst of the adjacent sacroiliac joint
IMPRESSION: 1. Limited exam secondary to lack of IV contrast.
2. Left nephrectomy, without local recurrence.
3. Tiny bilateral pulmonary nodules, favored to be benign. These
could be re-evaluated with chest CT at 6 months.
4. Upper pole right renal lesion is incompletely characterized.
Correlate with prior imaging and if no prior imaging exists,
consider dedicated pre and post contrast abdominal MRI.
5. Mild hepatic steatosis.
6. Bilateral adrenal adenomas.
7. Pulmonary artery enlargement suggests pulmonary arterial
hypertension.
8. Mild ascending aortic dilatation at up to 4.3 cm. Recommend
annual imaging followup by CTA or MRA. This recommendation follows
4727 ACCF/AHA/AATS/ACR/ASA/SCA/MAYAN/PUPO/FLEURA/NEDELJKO CICO Guidelines for the
Diagnosis and Management of Patients with Thoracic Aortic Disease.
Circulation. 4727; 121: [id number]. Aortic aneurysm NOS ([id number].4)

## 2023-07-21 ENCOUNTER — Inpatient Hospital Stay (HOSPITAL_BASED_OUTPATIENT_CLINIC_OR_DEPARTMENT_OTHER): Payer: Medicare PPO | Admitting: Medical Oncology

## 2023-07-21 ENCOUNTER — Inpatient Hospital Stay: Payer: Medicare PPO | Attending: Hematology & Oncology

## 2023-07-21 ENCOUNTER — Encounter: Payer: Self-pay | Admitting: Medical Oncology

## 2023-07-21 VITALS — BP 119/71 | HR 71 | Temp 98.4°F | Resp 18 | Wt 254.0 lb

## 2023-07-21 DIAGNOSIS — C642 Malignant neoplasm of left kidney, except renal pelvis: Secondary | ICD-10-CM

## 2023-07-21 DIAGNOSIS — Z08 Encounter for follow-up examination after completed treatment for malignant neoplasm: Secondary | ICD-10-CM | POA: Diagnosis present

## 2023-07-21 DIAGNOSIS — Z905 Acquired absence of kidney: Secondary | ICD-10-CM | POA: Diagnosis not present

## 2023-07-21 DIAGNOSIS — Z85528 Personal history of other malignant neoplasm of kidney: Secondary | ICD-10-CM | POA: Insufficient documentation

## 2023-07-21 LAB — CBC WITH DIFFERENTIAL (CANCER CENTER ONLY)
Abs Immature Granulocytes: 0.05 10*3/uL (ref 0.00–0.07)
Basophils Absolute: 0.1 10*3/uL (ref 0.0–0.1)
Basophils Relative: 1 %
Eosinophils Absolute: 0.2 10*3/uL (ref 0.0–0.5)
Eosinophils Relative: 3 %
HCT: 43.8 % (ref 39.0–52.0)
Hemoglobin: 14.6 g/dL (ref 13.0–17.0)
Immature Granulocytes: 1 %
Lymphocytes Relative: 19 %
Lymphs Abs: 1.2 10*3/uL (ref 0.7–4.0)
MCH: 28.7 pg (ref 26.0–34.0)
MCHC: 33.3 g/dL (ref 30.0–36.0)
MCV: 86.2 fL (ref 80.0–100.0)
Monocytes Absolute: 0.4 10*3/uL (ref 0.1–1.0)
Monocytes Relative: 7 %
Neutro Abs: 4.4 10*3/uL (ref 1.7–7.7)
Neutrophils Relative %: 69 %
Platelet Count: 143 10*3/uL — ABNORMAL LOW (ref 150–400)
RBC: 5.08 MIL/uL (ref 4.22–5.81)
RDW: 13.7 % (ref 11.5–15.5)
WBC Count: 6.3 10*3/uL (ref 4.0–10.5)
nRBC: 0 % (ref 0.0–0.2)

## 2023-07-21 LAB — CMP (CANCER CENTER ONLY)
ALT: 14 U/L (ref 0–44)
AST: 14 U/L — ABNORMAL LOW (ref 15–41)
Albumin: 4.9 g/dL (ref 3.5–5.0)
Alkaline Phosphatase: 83 U/L (ref 38–126)
Anion gap: 9 (ref 5–15)
BUN: 34 mg/dL — ABNORMAL HIGH (ref 8–23)
CO2: 31 mmol/L (ref 22–32)
Calcium: 9.6 mg/dL (ref 8.9–10.3)
Chloride: 102 mmol/L (ref 98–111)
Creatinine: 2.23 mg/dL — ABNORMAL HIGH (ref 0.61–1.24)
GFR, Estimated: 32 mL/min — ABNORMAL LOW (ref >60)
Glucose, Bld: 161 mg/dL — ABNORMAL HIGH (ref 70–99)
Potassium: 4.1 mmol/L (ref 3.5–5.1)
Sodium: 142 mmol/L (ref 135–145)
Total Bilirubin: 0.7 mg/dL (ref 0.0–1.2)
Total Protein: 7.1 g/dL (ref 6.5–8.1)

## 2023-07-21 LAB — LACTATE DEHYDROGENASE: LDH: 153 U/L (ref 98–192)

## 2023-07-21 NOTE — Progress Notes (Signed)
 Hematology and Oncology Follow Up Visit  Craig Smith [id number] 1955-09-11 68 y.o. 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  We last saw him back in November.  Since then, he has been doing pretty well.    Today he reports that he has been well overall. He was hospitalized on 06/16/2023 ([facility])for urinary retention and hydronephrosis of the right kidney. This was suspected to be secondary to constipation. Foley cath placed. He was started on Lactulose, miralax and flomax. He has been followed closely by Urology since.   He has close follow up with GI related to his new constipation.   He has had no problems with hematuria.  He has had no problems with cough or shortness of breath.  There has been no issues with COVID.  No night sweats. He has lost 10 pounds since we last saw him which he attributes to diet changes.   Overall, I would have to say that his performance status is probably ECOG 1.   Wt Readings from Last 3 Encounters:  07/21/23 254 lb (115.2 kg)  01/21/23 264 lb 0.6 oz (119.8 kg)  05/21/22 270 lb 1.3 oz (122.5 kg)   Medications:  Current Outpatient Medications:    acetaminophen (TYLENOL) 650 MG CR tablet, Take 1,300 mg by mouth daily as needed., Disp: , Rfl:    amLODipine (NORVASC) 10 MG tablet, Take 10 mg by mouth daily., Disp: , Rfl:    aspirin EC 81 MG tablet, Take 81 mg by mouth daily., Disp: , Rfl:    atorvastatin (LIPITOR) 40 MG tablet, Take 40 mg by mouth daily., Disp: , Rfl:    chlorthalidone (HYGROTEN) 15 MG tablet, Take 15 mg by mouth daily., Disp: , Rfl:    Dulaglutide (TRULICITY) 4.5 MG/0.5ML SOPN, Weekly., Disp: , Rfl:    febuxostat (ULORIC) 40 MG tablet, Take 40 mg by mouth daily at 12 noon., Disp: , Rfl:    finasteride (PROSCAR) 5 MG tablet, Take 5 mg by mouth daily.,  Disp: , Rfl:    fluticasone (FLONASE) 50 MCG/ACT nasal spray, Place 1 spray into both nostrils daily as needed., Disp: , Rfl:    FREESTYLE LITE test strip, 1 each as needed., Disp: , Rfl:    glipiZIDE  (GLUCOTROL ) 5 MG tablet, Take by mouth daily before breakfast., Disp: , Rfl:    JARDIANCE 10 MG TABS tablet, Take 10 mg by mouth daily., Disp: , Rfl:    psyllium (HYDROCIL/METAMUCIL) 95 % PACK, Take 1 packet by mouth daily., Disp: , Rfl:    tamsulosin (FLOMAX) 0.4 MG CAPS capsule, Take 0.4 mg by mouth daily., Disp: , Rfl:    triamcinolone (NASACORT) 55 MCG/ACT AERO nasal inhaler, two sprays by Both Nostrils route daily., Disp: , Rfl:    VITAMIN D PO, Take 2,000 Units by mouth daily., Disp: , Rfl:    nitroGLYCERIN (NITROSTAT) 0.4 MG SL tablet, Place 0.4 mg under the tongue as needed. (Patient not taking: Reported on 07/21/2023), Disp: , Rfl:   Allergies:  Allergies  Allergen Reactions   Allopurinol Diarrhea, Nausea And Vomiting and Other (See Comments)   Other Hives   Sulfa Antibiotics Hives   Nsaids Other (See Comments)    Past Medical History, Surgical history, Social history, and Family History were reviewed and updated.  Review of Systems:  Review of Systems  Constitutional: Negative.   HENT:  Negative.    Eyes: Negative.   Respiratory: Negative.    Cardiovascular: Negative.   Gastrointestinal: Negative.   Endocrine: Negative.   Genitourinary: Negative.    Musculoskeletal: Negative.   Skin: Negative.   Neurological: Negative.   Hematological: Negative.   Psychiatric/Behavioral: Negative.      Physical Exam:  weight is 254 lb (115.2 kg). His oral temperature is 98.4 F (36.9 C). His blood pressure is 119/71 and his pulse is 71. His respiration is 18 and oxygen saturation is 100%.   Wt Readings from Last 3 Encounters:  07/21/23 254 lb (115.2 kg)  01/21/23 264 lb 0.6 oz (119.8 kg)  05/21/22 270 lb 1.3 oz (122.5 kg)    Physical Exam Vitals reviewed.  HENT:     Head:  Normocephalic and atraumatic.  Eyes:     Pupils: Pupils are equal, round, and reactive to light.  Cardiovascular:     Rate and Rhythm: Normal rate and regular rhythm.     Heart sounds: Normal heart sounds.  Pulmonary:     Effort: Pulmonary effort is normal.     Breath sounds: Normal breath sounds.  Abdominal:     General: Bowel sounds are normal.     Palpations: Abdomen is soft.     Comments: Abdominal exam shows the laparoscopic scar in the left upper quadrant.  This is well-healed.  There is no fluid wave in the abdomen.  There is no abdominal mass.  There is no palpable liver or spleen tip.  There is no guarding or rebound tenderness.  Musculoskeletal:        General: No tenderness or deformity. Normal range of motion.     Cervical back: Normal range of motion.  Lymphadenopathy:     Cervical: No cervical adenopathy.  Skin:    General: Skin is warm and dry.     Findings: No erythema or rash.  Neurological:     Mental Status: He is alert and oriented to person, place, and time.  Psychiatric:        Behavior: Behavior normal.        Thought Content: Thought content normal.        Judgment: Judgment normal.      Lab Results  Component Value Date   WBC 6.3 07/21/2023   HGB 14.6 07/21/2023   HCT 43.8 07/21/2023   MCV 86.2 07/21/2023   PLT 143 (L) 07/21/2023     Chemistry      Component Value Date/Time   NA 142 07/21/2023 0915   K 4.1 07/21/2023 0915   CL 102 07/21/2023 0915   CO2 31 07/21/2023 0915   BUN 34 (H) 07/21/2023 0915   CREATININE 2.23 (H) 07/21/2023 0915      Component Value Date/Time   CALCIUM 9.6 07/21/2023 0915   ALKPHOS 83 07/21/2023 0915   AST 14 (L) 07/21/2023 0915   ALT 14 07/21/2023 0915   BILITOT 0.7 07/21/2023 0915     Encounter Diagnosis  Name Primary?   Cancer of left kidney [facility]) Yes    Impression and Plan: Craig Smith is a very nice 68 year old white male.  He has a history of a stage I renal cell carcinoma.  This was removed back in  August 2021.  Last CT scan on 06/25/2023 showed no evidence of local recurrence or metastatic disease. Right renal lesion, tiny pulmonary nodules and thoracic aorta are stable and unchanged.   CT Chest/Abd/Pelvis wo in  6 months RTC 6 months (1-2 weeks after CT scan) MD, labs   Sharla Davis, PA-C 5/14/202510:35 AM

## 2023-07-22 NOTE — Addendum Note (Signed)
 Addended by: Sunnie England on: 07/22/2023 02:37 PM   Modules accepted: Orders

## 2023-10-03 IMAGING — CT CT CHEST-ABD-PELV W/O CM
2 of 4 series · 13 of 36 positions shown, 15 images · non-contrast
Comparison: Previous CTs 03/18/2021

CLINICAL DATA: Status post left nephrectomy for renal cell
carcinoma. Hematologic malignancy, assess treatment response.
History of chronic kidney disease and diabetes.



[Series 2: axial st · axial · 0.98mm/px · z∈[-742,-87]mm · 10 of 155 slices shown, 12 images]
[im 12/155  mediastinal]
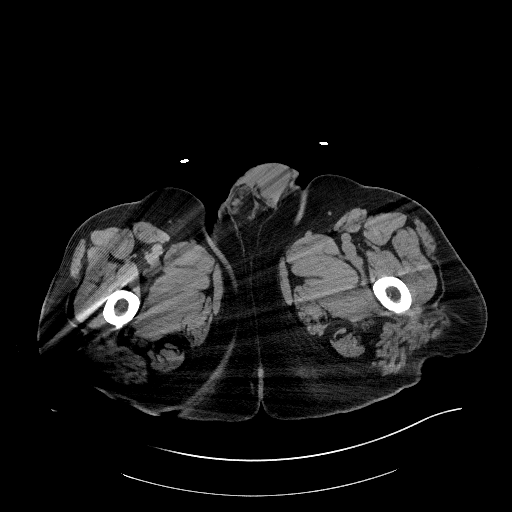
[im 12/155  bone]
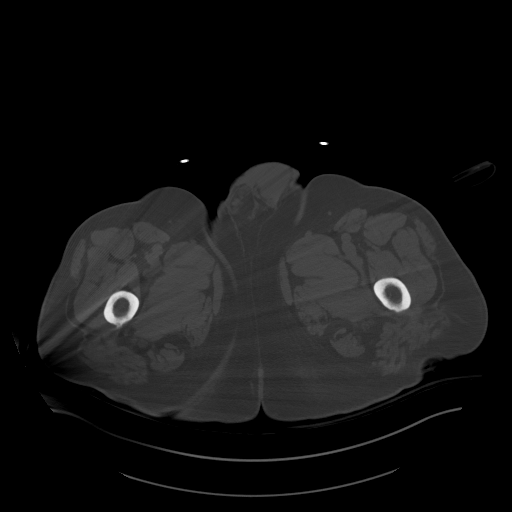
[im 24/155  mediastinal]
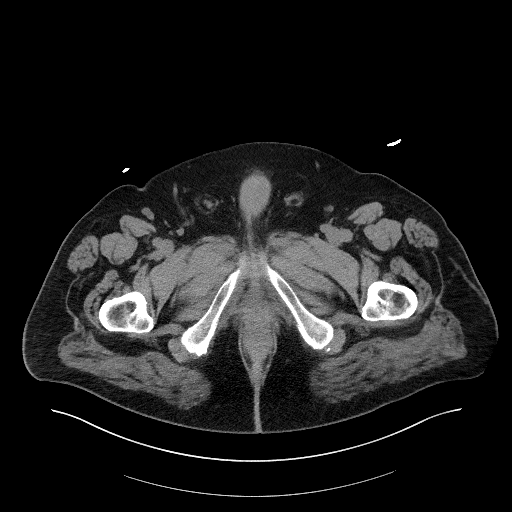
[im 48/155  mediastinal]
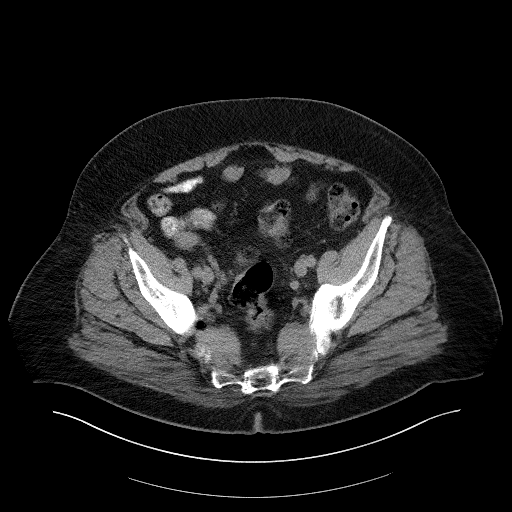
[im 60/155  mediastinal]
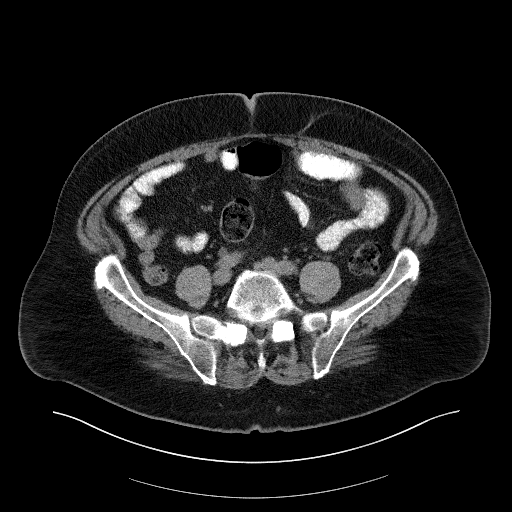
[im 72/155  mediastinal]
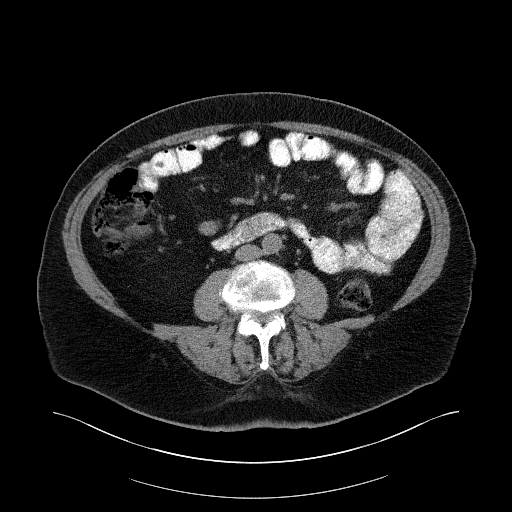
[im 83/155  mediastinal]
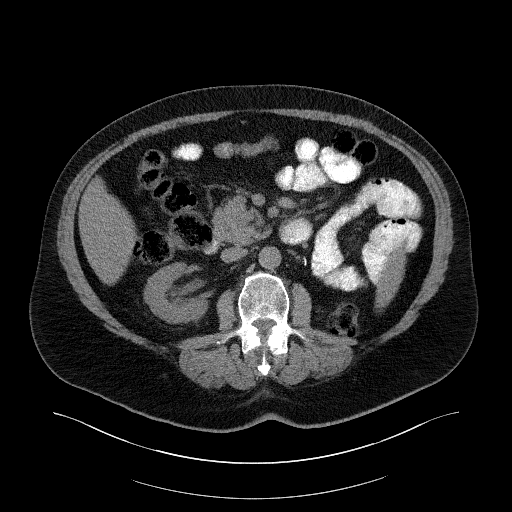
[im 95/155  mediastinal]
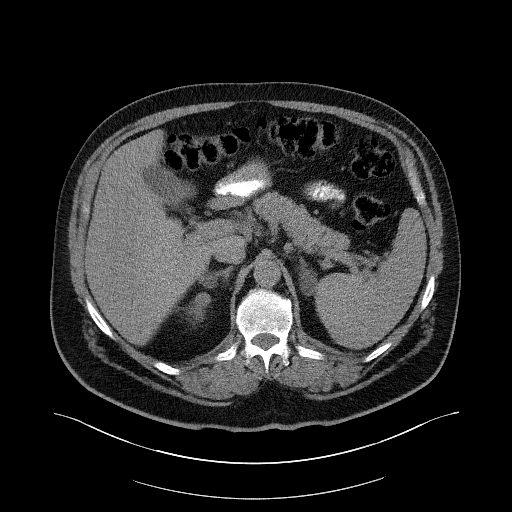
[im 119/155  mediastinal]
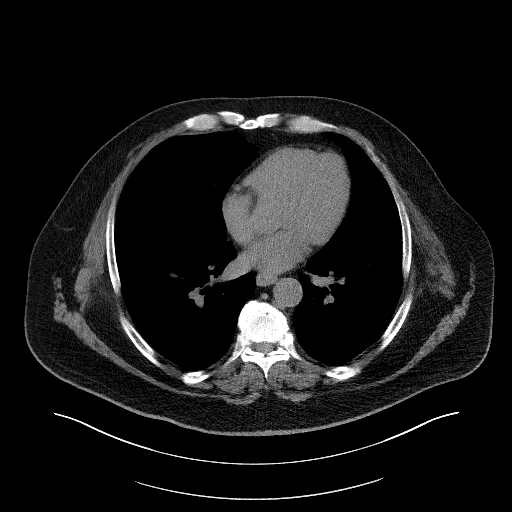
[im 131/155  mediastinal]
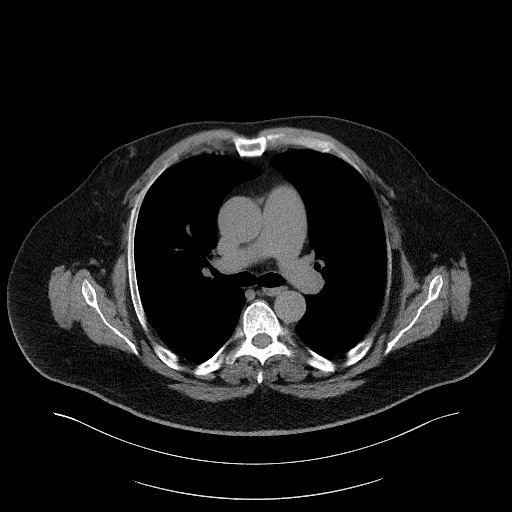
[im 131/155  bone]
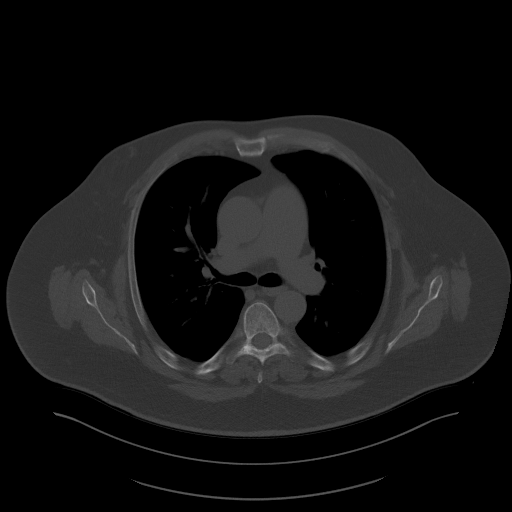
[im 143/155  mediastinal]
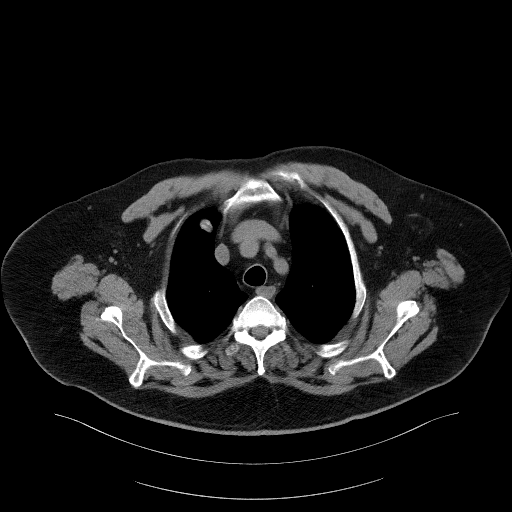

[Series 5: coronal · coronal · 1.01mm/px · 3 of 172 slices shown]
[im 35/172  mediastinal]
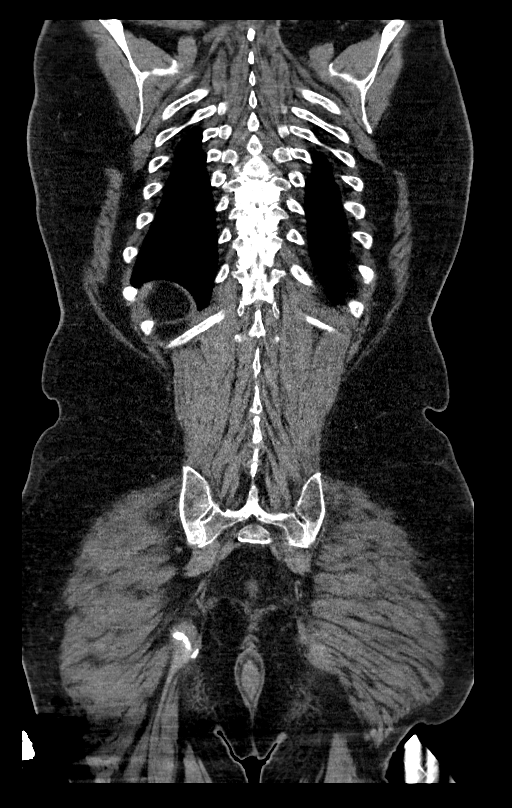
[im 69/172  mediastinal]
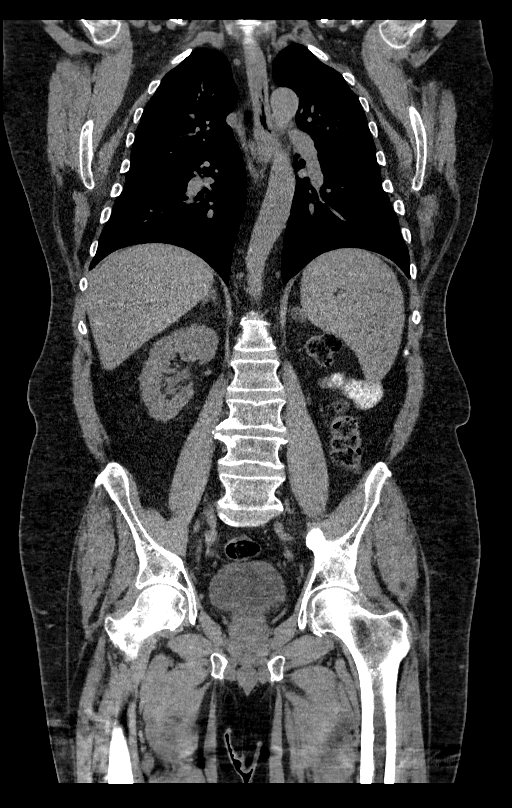
[im 103/172  mediastinal]
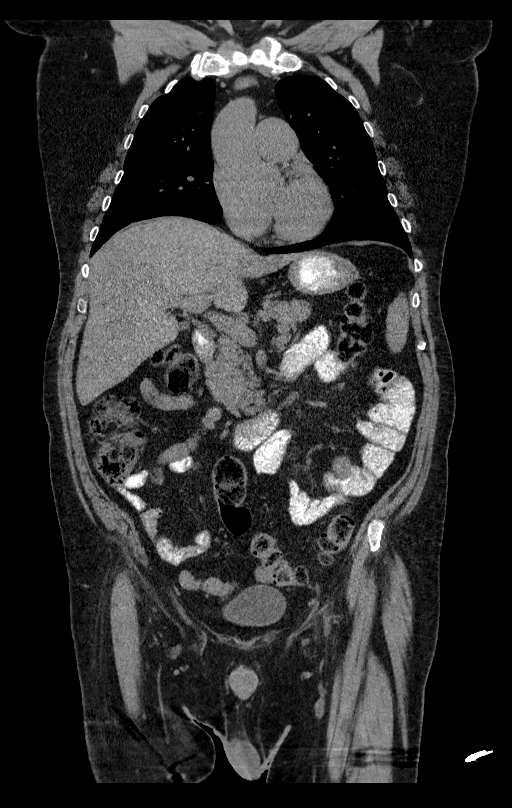

[13 of 36 positions shown; findings below may reference images not displayed]

FINDINGS: CT CHEST FINDINGS

Cardiovascular: Stable minimal aortic and great vessel
atherosclerosis with mild dilatation of the ascending aorta to
cm. There is central enlargement of the pulmonary arteries
consistent with pulmonary arterial hypertension. No acute vascular
findings on noncontrast imaging. The heart size is normal. There is
no pericardial effusion.

Mediastinum/Nodes: There are no enlarged mediastinal, hilar or
axillary lymph nodes. Hilar assessment is limited by the lack of
intravenous contrast, although the hilar contours appear unchanged.
Stable appearance of the thyroid gland without significant findings.
The trachea and esophagus appear unremarkable.

Lungs/Pleura: No pleural effusion or pneumothorax. Stable scattered
tiny pulmonary nodules, largest 4 mm in the left upper lobe on image
41/4. No new, enlarging or otherwise suspicious nodules identified.

Musculoskeletal/Chest wall: No chest wall mass or suspicious osseous
findings.

CT ABDOMEN AND PELVIS FINDINGS

Hepatobiliary: The liver appears stable without focal abnormality on
noncontrast imaging. No evidence of gallstones, gallbladder wall
thickening or biliary dilatation.

Pancreas: Unremarkable. No pancreatic ductal dilatation or
surrounding inflammatory changes.

Spleen: Stable borderline splenomegaly without focal abnormality on
noncontrast imaging.

Adrenals/Urinary Tract: Stable bilateral adrenal adenomas, measuring
2.4 cm on the right on image 65/2 and 2.2 cm on the left on image
61/2. Both of these nodules demonstrate density near water. No
suspicious adrenal findings. Stable appearance of the left
nephrectomy bed without recurrent mass lesion. 1.4 cm exophytic
lesion from the upper pole of the right kidney measures 21 HU and is
unchanged from the previous study. No other renal masses are
identified on noncontrast imaging. No evidence of urinary tract
calculus or hydronephrosis. The bladder appears unremarkable.

Stomach/Bowel: Enteric contrast was administered and has passed into
the distal small bowel. The stomach appears unremarkable for its
degree of distension. No evidence of bowel wall thickening,
distention or surrounding inflammatory change. Evidence of previous
appendectomy.

Vascular/Lymphatic: There are no enlarged abdominal or pelvic lymph
nodes. Aortic and branch vessel atherosclerosis.

Reproductive: The prostate gland and seminal vesicles appear
unremarkable.

Other: No evidence of abdominal wall mass or hernia. No ascites.

Musculoskeletal: No acute or significant osseous findings. Mild
spondylosis. Stable sclerotic lesion in the right ischium, likely a
bone island.
IMPRESSION: 1. Stable CTs of the chest, abdomen and pelvis status post left
nephrectomy. No findings suspicious for local recurrence or
metastatic disease.
2. Tiny pulmonary nodules bilaterally are unchanged, likely benign.
3. Stable appearance of small indeterminate lesion projecting from
the upper pole of the right kidney, incompletely characterized
without contrast. If there is no older imaging to confirm the
stability of this finding, continued follow-up recommended.
4. Stable bilateral adrenal adenomas.
5. Central enlargement of the pulmonary arteries suggesting
pulmonary arterial hypertension. Stable mild dilatation of the
ascending aorta.

## 2024-01-06 ENCOUNTER — Encounter (HOSPITAL_BASED_OUTPATIENT_CLINIC_OR_DEPARTMENT_OTHER): Payer: Self-pay

## 2024-01-06 ENCOUNTER — Ambulatory Visit (HOSPITAL_BASED_OUTPATIENT_CLINIC_OR_DEPARTMENT_OTHER)
Admission: RE | Admit: 2024-01-06 | Discharge: 2024-01-06 | Disposition: A | Discharge status: Home / Self Care | Source: Ambulatory Visit | Attending: Medical Oncology | Admitting: Medical Oncology

## 2024-01-06 DIAGNOSIS — C642 Malignant neoplasm of left kidney, except renal pelvis: Secondary | ICD-10-CM | POA: Insufficient documentation

## 2024-01-17 ENCOUNTER — Ambulatory Visit: Payer: Self-pay | Admitting: Medical Oncology

## 2024-01-26 ENCOUNTER — Inpatient Hospital Stay: Attending: Hematology & Oncology

## 2024-01-26 ENCOUNTER — Encounter: Payer: Self-pay | Admitting: Hematology & Oncology

## 2024-01-26 ENCOUNTER — Inpatient Hospital Stay: Admitting: Hematology & Oncology

## 2024-01-26 VITALS — BP 94/71 | HR 67 | Temp 97.8°F | Resp 18 | Ht 75.2 in | Wt 254.0 lb

## 2024-01-26 DIAGNOSIS — C642 Malignant neoplasm of left kidney, except renal pelvis: Secondary | ICD-10-CM

## 2024-01-26 DIAGNOSIS — I712 Thoracic aortic aneurysm, without rupture, unspecified: Secondary | ICD-10-CM | POA: Insufficient documentation

## 2024-01-26 DIAGNOSIS — M479 Spondylosis, unspecified: Secondary | ICD-10-CM | POA: Diagnosis not present

## 2024-01-26 DIAGNOSIS — N400 Enlarged prostate without lower urinary tract symptoms: Secondary | ICD-10-CM | POA: Diagnosis not present

## 2024-01-26 DIAGNOSIS — Z905 Acquired absence of kidney: Secondary | ICD-10-CM | POA: Diagnosis not present

## 2024-01-26 LAB — CMP (CANCER CENTER ONLY)
ALT: 10 U/L (ref 0–44)
AST: 16 U/L (ref 15–41)
Albumin: 4.5 g/dL (ref 3.5–5.0)
Alkaline Phosphatase: 99 U/L (ref 38–126)
Anion gap: 12 (ref 5–15)
BUN: 32 mg/dL — ABNORMAL HIGH (ref 8–23)
CO2: 24 mmol/L (ref 22–32)
Calcium: 9.3 mg/dL (ref 8.9–10.3)
Chloride: 105 mmol/L (ref 98–111)
Creatinine: 2.04 mg/dL — ABNORMAL HIGH (ref 0.61–1.24)
GFR, Estimated: 35 mL/min — ABNORMAL LOW (ref >60)
Glucose, Bld: 104 mg/dL — ABNORMAL HIGH (ref 70–99)
Potassium: 4.5 mmol/L (ref 3.5–5.1)
Sodium: 141 mmol/L (ref 135–145)
Total Bilirubin: 0.5 mg/dL (ref 0.0–1.2)
Total Protein: 7.1 g/dL (ref 6.5–8.1)

## 2024-01-26 LAB — LACTATE DEHYDROGENASE: LDH: 180 U/L (ref 105–235)

## 2024-01-26 LAB — CBC WITH DIFFERENTIAL (CANCER CENTER ONLY)
Abs Immature Granulocytes: 0.02 K/uL (ref 0.00–0.07)
Basophils Absolute: 0.1 K/uL (ref 0.0–0.1)
Basophils Relative: 1 %
Eosinophils Absolute: 0.2 K/uL (ref 0.0–0.5)
Eosinophils Relative: 3 %
HCT: 45.8 % (ref 39.0–52.0)
Hemoglobin: 15.3 g/dL (ref 13.0–17.0)
Immature Granulocytes: 0 %
Lymphocytes Relative: 20 %
Lymphs Abs: 1.1 K/uL (ref 0.7–4.0)
MCH: 29 pg (ref 26.0–34.0)
MCHC: 33.4 g/dL (ref 30.0–36.0)
MCV: 86.7 fL (ref 80.0–100.0)
Monocytes Absolute: 0.4 K/uL (ref 0.1–1.0)
Monocytes Relative: 7 %
Neutro Abs: 3.7 K/uL (ref 1.7–7.7)
Neutrophils Relative %: 69 %
Platelet Count: 147 K/uL — ABNORMAL LOW (ref 150–400)
RBC: 5.28 MIL/uL (ref 4.22–5.81)
RDW: 13.9 % (ref 11.5–15.5)
WBC Count: 5.4 K/uL (ref 4.0–10.5)
nRBC: 0 % (ref 0.0–0.2)

## 2024-01-26 NOTE — Progress Notes (Signed)
 Hematology and Oncology Follow Up Visit  Dhairya Corales [id number] [birth year] 68 y.o. 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 Savastano is back for follow-up.  We last saw him back in May.  Since then, he has been doing okay.  He now has spondylosis.  He is going to see a spine doctor for this.  Hopefully, he will not need surgery.  He did have problems with prostate enlargement.  He needed a Foley placed.  I think this was back in April.  Since then, he has been doing pretty well.  He did have a CT scan that was done on 01/06/2024.  The CT scan did not show any evidence of recurrent/metastatic kidney cancer.  He has unchanged subcentimeter pulmonary nodules.  He does have the thoracic aortic aneurysm that is 4.4 x 4.3 cm.  He has had no bleeding.  He has had no cough or shortness of breath.  He has had no headache.  Overall, I will say that his performance status is probably ECOG 1.    Medications:  Current Outpatient Medications:    acetaminophen (TYLENOL) 650 MG CR tablet, Take 1,300 mg by mouth daily as needed., Disp: , Rfl:    amLODipine (NORVASC) 10 MG tablet, Take 10 mg by mouth daily., Disp: , Rfl:    atorvastatin (LIPITOR) 40 MG tablet, Take 40 mg by mouth daily., Disp: , Rfl:    chlorthalidone (HYGROTEN) 15 MG tablet, Take 15 mg by mouth daily., Disp: , Rfl:    Dulaglutide (TRULICITY) 4.5 MG/0.5ML SOPN, Weekly., Disp: , Rfl:    febuxostat (ULORIC) 40 MG tablet, Take 40 mg by mouth daily at 12 noon., Disp: , Rfl:    finasteride (PROSCAR) 5 MG tablet, Take 5 mg by mouth daily., Disp: , Rfl:    fluticasone (FLONASE) 50 MCG/ACT nasal spray, Place 1 spray into both nostrils daily as needed., Disp: , Rfl:    FREESTYLE LITE test strip, 1 each as needed., Disp: , Rfl:    glipiZIDE  (GLUCOTROL ) 5 MG tablet, Take by mouth daily before  breakfast., Disp: , Rfl:    JARDIANCE 10 MG TABS tablet, Take 10 mg by mouth daily., Disp: , Rfl:    nitroGLYCERIN (NITROSTAT) 0.4 MG SL tablet, Place 0.4 mg under the tongue as needed., Disp: , Rfl:    psyllium (HYDROCIL/METAMUCIL) 95 % PACK, Take 1 packet by mouth daily., Disp: , Rfl:    tamsulosin (FLOMAX) 0.4 MG CAPS capsule, Take 0.4 mg by mouth daily., Disp: , Rfl:    triamcinolone (NASACORT) 55 MCG/ACT AERO nasal inhaler, two sprays by Both Nostrils route daily., Disp: , Rfl:    VITAMIN D PO, Take 2,000 Units by mouth daily., Disp: , Rfl:   Allergies:  Allergies  Allergen Reactions   Prednisone Other (See Comments)    Elevated blood sugars that the meter can't read   Allopurinol Diarrhea, Nausea And Vomiting and Other (See Comments)   Other Hives   Sulfa Antibiotics Hives   Nsaids Other (See Comments)    Past Medical History, Surgical history, Social history, and Family History were reviewed and updated.  Review of Systems: Review of Systems  Constitutional: Negative.   HENT:  Negative.    Eyes: Negative.   Respiratory: Negative.    Cardiovascular: Negative.   Gastrointestinal: Negative.   Endocrine: Negative.  Genitourinary: Negative.    Musculoskeletal: Negative.   Skin: Negative.   Neurological: Negative.   Hematological: Negative.   Psychiatric/Behavioral: Negative.      Physical Exam:  height is 6' 3.2 (1.91 m) and weight is 254 lb (115.2 kg). His oral temperature is 97.8 F (36.6 C). His blood pressure is 94/71 and his pulse is 67. His respiration is 18 and oxygen saturation is 100%.   Wt Readings from Last 3 Encounters:  01/26/24 254 lb (115.2 kg)  07/21/23 254 lb (115.2 kg)  01/21/23 264 lb 0.6 oz (119.8 kg)    Physical Exam Vitals reviewed.  HENT:     Head: Normocephalic and atraumatic.  Eyes:     Pupils: Pupils are equal, round, and reactive to light.  Cardiovascular:     Rate and Rhythm: Normal rate and regular rhythm.     Heart sounds:  Normal heart sounds.  Pulmonary:     Effort: Pulmonary effort is normal.     Breath sounds: Normal breath sounds.  Abdominal:     General: Bowel sounds are normal.     Palpations: Abdomen is soft.     Comments: Abdominal exam shows the laparoscopic scar in the left upper quadrant.  This is well-healed.  There is no fluid wave in the abdomen.  There is no abdominal mass.  There is no palpable liver or spleen tip.  There is no guarding or rebound tenderness.  Musculoskeletal:        General: No tenderness or deformity. Normal range of motion.     Cervical back: Normal range of motion.  Lymphadenopathy:     Cervical: No cervical adenopathy.  Skin:    General: Skin is warm and dry.     Findings: No erythema or rash.  Neurological:     Mental Status: He is alert and oriented to person, place, and time.  Psychiatric:        Behavior: Behavior normal.        Thought Content: Thought content normal.        Judgment: Judgment normal.      Lab Results  Component Value Date   WBC 6.3 07/21/2023   HGB 14.6 07/21/2023   HCT 43.8 07/21/2023   MCV 86.2 07/21/2023   PLT 143 (L) 07/21/2023     Chemistry      Component Value Date/Time   NA 142 07/21/2023 0915   K 4.1 07/21/2023 0915   CL 102 07/21/2023 0915   CO2 31 07/21/2023 0915   BUN 34 (H) 07/21/2023 0915   CREATININE 2.23 (H) 07/21/2023 0915      Component Value Date/Time   CALCIUM 9.6 07/21/2023 0915   ALKPHOS 83 07/21/2023 0915   AST 14 (L) 07/21/2023 0915   ALT 14 07/21/2023 0915   BILITOT 0.7 07/21/2023 0915      Impression and Plan: Mr. Marcott is a very nice 69 year old white male.  He has a history of a stage I renal cell carcinoma.  This was removed back in August 2021.  It has now been almost 4-1/2 years since he had his LEFT kidney removed.  At this point in time, I think we can probably do scans once a year.  I do believe that he is cured.  I do not see a problem if he needs any intervention for this  ascending thoracic aortic aneurysm.  We will plan to get him back in 1 year now.  It we will do the CT scan before we  see him back.. .   Maude JONELLE Crease, MD 11/19/20251:56 PM

## 2025-01-25 ENCOUNTER — Inpatient Hospital Stay: Admitting: Hematology & Oncology

## 2025-01-25 ENCOUNTER — Inpatient Hospital Stay
